# Patient Record
Sex: Female | Born: 1975 | Race: White | Hispanic: No | Marital: Married | Smoking: Never smoker
Health system: Southern US, Community
[De-identification: ages and names within clinical notes are randomized; demographics above are authoritative.]

## PROBLEM LIST (undated history)

## (undated) ENCOUNTER — Emergency Department (HOSPITAL_COMMUNITY): Discharge status: Against Medical Advice | Payer: BC Managed Care – PPO

## (undated) DIAGNOSIS — M2550 Pain in unspecified joint: Secondary | ICD-10-CM

## (undated) DIAGNOSIS — E785 Hyperlipidemia, unspecified: Secondary | ICD-10-CM

## (undated) DIAGNOSIS — R768 Other specified abnormal immunological findings in serum: Secondary | ICD-10-CM

## (undated) DIAGNOSIS — K5190 Ulcerative colitis, unspecified, without complications: Secondary | ICD-10-CM

## (undated) DIAGNOSIS — K219 Gastro-esophageal reflux disease without esophagitis: Secondary | ICD-10-CM

## (undated) DIAGNOSIS — F419 Anxiety disorder, unspecified: Secondary | ICD-10-CM

## (undated) DIAGNOSIS — R1012 Left upper quadrant pain: Secondary | ICD-10-CM

## (undated) DIAGNOSIS — R5383 Other fatigue: Secondary | ICD-10-CM

## (undated) DIAGNOSIS — B009 Herpesviral infection, unspecified: Secondary | ICD-10-CM

## (undated) DIAGNOSIS — I209 Angina pectoris, unspecified: Secondary | ICD-10-CM

## (undated) DIAGNOSIS — R7689 Other specified abnormal immunological findings in serum: Secondary | ICD-10-CM

## (undated) DIAGNOSIS — R1013 Epigastric pain: Secondary | ICD-10-CM

## (undated) DIAGNOSIS — J302 Other seasonal allergic rhinitis: Secondary | ICD-10-CM

## (undated) DIAGNOSIS — R5381 Other malaise: Secondary | ICD-10-CM

## (undated) DIAGNOSIS — R42 Dizziness and giddiness: Secondary | ICD-10-CM

## (undated) DIAGNOSIS — R209 Unspecified disturbances of skin sensation: Secondary | ICD-10-CM

## (undated) DIAGNOSIS — G44209 Tension-type headache, unspecified, not intractable: Secondary | ICD-10-CM

## (undated) DIAGNOSIS — F411 Generalized anxiety disorder: Secondary | ICD-10-CM

## (undated) DIAGNOSIS — F5102 Adjustment insomnia: Secondary | ICD-10-CM

## (undated) DIAGNOSIS — Z981 Arthrodesis status: Secondary | ICD-10-CM

## (undated) DIAGNOSIS — Z8739 Personal history of other diseases of the musculoskeletal system and connective tissue: Secondary | ICD-10-CM

## (undated) DIAGNOSIS — R0789 Other chest pain: Secondary | ICD-10-CM

## (undated) HISTORY — DX: Dizziness and giddiness: R42

## (undated) HISTORY — DX: Unspecified disturbances of skin sensation: R20.9

## (undated) HISTORY — DX: Ulcerative colitis, unspecified, without complications: K51.90

## (undated) HISTORY — DX: Other fatigue: R53.83

## (undated) HISTORY — DX: Other seasonal allergic rhinitis: J30.2

## (undated) HISTORY — DX: Anxiety disorder, unspecified: F41.9

## (undated) HISTORY — DX: Gastro-esophageal reflux disease without esophagitis: K21.9

## (undated) HISTORY — DX: Adjustment insomnia: F51.02

## (undated) HISTORY — DX: Epigastric pain: R10.13

## (undated) HISTORY — DX: Generalized anxiety disorder: F41.1

## (undated) HISTORY — DX: Other chest pain: R07.89

## (undated) HISTORY — DX: Personal history of other diseases of the musculoskeletal system and connective tissue: Z87.39

## (undated) HISTORY — DX: Other specified abnormal immunological findings in serum: R76.89

## (undated) HISTORY — DX: Arthrodesis status: Z98.1

## (undated) HISTORY — DX: Hyperlipidemia, unspecified: E78.5

## (undated) HISTORY — DX: Left upper quadrant pain: R10.12

## (undated) HISTORY — DX: Other malaise: R53.81

## (undated) HISTORY — DX: Pain in unspecified joint: M25.50

## (undated) HISTORY — DX: Tension-type headache, unspecified, not intractable: G44.209

## (undated) HISTORY — DX: Other specified abnormal immunological findings in serum: R76.8

---

## 2006-01-16 HISTORY — PX: SPINAL FUSION: SHX223

## 2008-01-17 HISTORY — PX: DILATION AND CURETTAGE OF UTERUS: SHX78

## 2010-01-16 HISTORY — PX: DILATION AND CURETTAGE OF UTERUS: SHX78

## 2011-12-19 LAB — HM PAP SMEAR: HM Pap smear: NEGATIVE

## 2012-02-07 ENCOUNTER — Encounter (HOSPITAL_COMMUNITY): Payer: Self-pay | Admitting: Pharmacist

## 2012-02-11 MED ORDER — DOXYCYCLINE HYCLATE 100 MG IV SOLR
100.0000 mg | Freq: Once | INTRAVENOUS | Status: AC
  Administered 2012-02-12: 100 mg via INTRAVENOUS
  Filled 2012-02-11: qty 100

## 2012-02-11 NOTE — H&P (Signed)
Alison Fleming is an 37 y.o. female with a history of menorrhagia, break through bleeding on oc, midcycle spotting since delivery.  Partuition complicated by retained placenta with post partum hemorrhage and curattage in both pregnancies.  Pt with normal pituitary hormone levels.  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  Pertinent Gynecological History: Menses: regulated on oc except midcycle spotting noted Bleeding: intermenstrual bleeding Contraception: OCP (estrogen/progesterone) DES exposure: denies Blood transfusions: none Sexually transmitted diseases: no past history Previous GYN Procedures: postpartum D&C  Last mammogram: n/a Date:  Last pap: normal Date: 12/2011 OB History: G3, P2012   Menstrual History: Menarche age: 15 No LMP recorded.    No past medical history on file.  No past surgical history on file.  No family history on file.  Social History:  does not have a smoking history on file. She does not have any smokeless tobacco history on file. Her alcohol and drug histories not on file.  Allergies: No Known Allergies  No prescriptions prior to admission    Review of Systems  Constitutional: Negative.   HENT: Negative.   Eyes: Negative.   Respiratory: Negative.   Cardiovascular: Negative.   Gastrointestinal: Negative.   Genitourinary: Negative.   Musculoskeletal: Negative.   Skin: Negative.   Neurological: Negative.   Endo/Heme/Allergies: Negative.   Psychiatric/Behavioral: Negative.  The patient does not have insomnia.     Height 5' 7"  (1.702 m), weight 58.968 kg (130 lb). Physical Exam  Constitutional: She is oriented to person, place, and time. She appears well-developed and well-nourished.  HENT:  Head: Normocephalic and atraumatic.  Eyes: Conjunctivae normal and EOM are normal. Pupils are equal, round, and reactive to light.  Neck: Normal range of motion. Neck supple.  Cardiovascular:  Normal rate, regular rhythm, normal heart sounds and intact distal pulses.   Respiratory: Effort normal and breath sounds normal.  GI: Soft. Bowel sounds are normal. Hernia confirmed negative in the right inguinal area and confirmed negative in the left inguinal area.  Genitourinary: Rectum normal, vagina normal and uterus normal. No breast swelling, tenderness, discharge or bleeding. Pelvic exam was performed with patient supine. There is no rash, tenderness, lesion or injury on the right labia. There is no rash, tenderness, lesion or injury on the left labia. Uterus is not deviated, not enlarged and not tender. Cervix exhibits no motion tenderness, no discharge and no friability. Right adnexum displays no mass, no tenderness and no fullness. Left adnexum displays no mass and no tenderness.  Musculoskeletal: Normal range of motion.  Neurological: She is alert and oriented to person, place, and time.  Skin: Skin is warm and dry.  Psychiatric: She has a normal mood and affect. Her behavior is normal. Judgment and thought content normal.    No results found for this or any previous visit (from the past 24 hour(s)).  No results found.  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    Taria Castrillo H 02/11/2012, 7:39 PM

## 2012-02-12 ENCOUNTER — Encounter (HOSPITAL_COMMUNITY)
Admission: RE | Disposition: A | Discharge status: Home / Self Care | Payer: Self-pay | Source: Ambulatory Visit | Attending: Gynecology

## 2012-02-12 ENCOUNTER — Encounter (HOSPITAL_COMMUNITY): Payer: Self-pay | Admitting: *Deleted

## 2012-02-12 ENCOUNTER — Ambulatory Visit (HOSPITAL_COMMUNITY)
Admission: RE | Admit: 2012-02-12 | Discharge: 2012-02-12 | Disposition: A | Discharge status: Home / Self Care | Payer: BC Managed Care – PPO | Source: Ambulatory Visit | Attending: Gynecology | Admitting: Gynecology

## 2012-02-12 ENCOUNTER — Encounter (HOSPITAL_COMMUNITY): Payer: Self-pay | Admitting: Anesthesiology

## 2012-02-12 ENCOUNTER — Ambulatory Visit (HOSPITAL_COMMUNITY): Payer: BC Managed Care – PPO | Admitting: Anesthesiology

## 2012-02-12 DIAGNOSIS — N938 Other specified abnormal uterine and vaginal bleeding: Secondary | ICD-10-CM | POA: Insufficient documentation

## 2012-02-12 DIAGNOSIS — N949 Unspecified condition associated with female genital organs and menstrual cycle: Secondary | ICD-10-CM | POA: Insufficient documentation

## 2012-02-12 DIAGNOSIS — N856 Intrauterine synechiae: Secondary | ICD-10-CM | POA: Insufficient documentation

## 2012-02-12 HISTORY — PX: DILATATION & CURRETTAGE/HYSTEROSCOPY WITH RESECTOCOPE: SHX5572

## 2012-02-12 LAB — CBC
HCT: 37.5 % (ref 36.0–46.0)
Hemoglobin: 12.7 g/dL (ref 12.0–15.0)
MCH: 29.7 pg (ref 26.0–34.0)
MCHC: 33.9 g/dL (ref 30.0–36.0)
RDW: 13 % (ref 11.5–15.5)

## 2012-02-12 SURGERY — DILATATION & CURETTAGE/HYSTEROSCOPY WITH RESECTOCOPE
Anesthesia: General | Site: Vagina | Wound class: Clean Contaminated

## 2012-02-12 MED ORDER — KETOROLAC TROMETHAMINE 30 MG/ML IJ SOLN
INTRAMUSCULAR | Status: DC | PRN
  Administered 2012-02-12: 30 mg via INTRAVENOUS

## 2012-02-12 MED ORDER — PROPOFOL 10 MG/ML IV EMUL
INTRAVENOUS | Status: AC
  Filled 2012-02-12: qty 20

## 2012-02-12 MED ORDER — LACTATED RINGERS IV SOLN
INTRAVENOUS | Status: DC
  Administered 2012-02-12: 09:00:00 via INTRAVENOUS

## 2012-02-12 MED ORDER — DEXTROSE 5 % IV SOLN
INTRAVENOUS | Status: DC | PRN
  Administered 2012-02-12: 09:00:00 via INTRAVENOUS

## 2012-02-12 MED ORDER — FENTANYL CITRATE 0.05 MG/ML IJ SOLN
INTRAMUSCULAR | Status: DC | PRN
  Administered 2012-02-12 (×4): 25 ug via INTRAVENOUS

## 2012-02-12 MED ORDER — MIDAZOLAM HCL 5 MG/5ML IJ SOLN
INTRAMUSCULAR | Status: DC | PRN
  Administered 2012-02-12: .5 mg via INTRAVENOUS
  Administered 2012-02-12: 1.5 mg via INTRAVENOUS

## 2012-02-12 MED ORDER — VASOPRESSIN 20 UNIT/ML IJ SOLN
INTRAMUSCULAR | Status: DC | PRN
  Administered 2012-02-12: 10 [IU]

## 2012-02-12 MED ORDER — BUPIVACAINE HCL (PF) 0.25 % IJ SOLN
INTRAMUSCULAR | Status: AC
  Filled 2012-02-12: qty 30

## 2012-02-12 MED ORDER — DEXAMETHASONE SODIUM PHOSPHATE 4 MG/ML IJ SOLN
INTRAMUSCULAR | Status: DC | PRN
  Administered 2012-02-12: 10 mg via INTRAVENOUS

## 2012-02-12 MED ORDER — VASOPRESSIN 20 UNIT/ML IJ SOLN
INTRAMUSCULAR | Status: AC
  Filled 2012-02-12: qty 1

## 2012-02-12 MED ORDER — ESTROGENS CONJUGATED 25 MG IJ SOLR
25.0000 mg | Freq: Once | INTRAMUSCULAR | Status: AC
  Administered 2012-02-12: 25 mg via INTRAVENOUS
  Filled 2012-02-12: qty 25

## 2012-02-12 MED ORDER — LIDOCAINE HCL (CARDIAC) 20 MG/ML IV SOLN
INTRAVENOUS | Status: AC
  Filled 2012-02-12: qty 5

## 2012-02-12 MED ORDER — BUPIVACAINE HCL (PF) 0.25 % IJ SOLN
INTRAMUSCULAR | Status: DC | PRN
  Administered 2012-02-12: 10 mL

## 2012-02-12 MED ORDER — LIDOCAINE HCL (CARDIAC) 20 MG/ML IV SOLN
INTRAVENOUS | Status: DC | PRN
  Administered 2012-02-12: 80 mg via INTRAVENOUS

## 2012-02-12 MED ORDER — METOCLOPRAMIDE HCL 5 MG/ML IJ SOLN: 10.0000 mg | Freq: Once | INTRAMUSCULAR | Status: DC | PRN

## 2012-02-12 MED ORDER — KETOROLAC TROMETHAMINE 30 MG/ML IJ SOLN
INTRAMUSCULAR | Status: AC
  Filled 2012-02-12: qty 1

## 2012-02-12 MED ORDER — MIDAZOLAM HCL 2 MG/2ML IJ SOLN
INTRAMUSCULAR | Status: AC
  Filled 2012-02-12: qty 2

## 2012-02-12 MED ORDER — BUPIVACAINE-EPINEPHRINE 0.25% -1:200000 IJ SOLN: INTRAMUSCULAR | Status: DC | PRN

## 2012-02-12 MED ORDER — FENTANYL CITRATE 0.05 MG/ML IJ SOLN
INTRAMUSCULAR | Status: AC
  Filled 2012-02-12: qty 2

## 2012-02-12 MED ORDER — DEXAMETHASONE SODIUM PHOSPHATE 10 MG/ML IJ SOLN
INTRAMUSCULAR | Status: AC
  Filled 2012-02-12: qty 2

## 2012-02-12 MED ORDER — ONDANSETRON HCL 4 MG/2ML IJ SOLN
INTRAMUSCULAR | Status: AC
  Filled 2012-02-12: qty 2

## 2012-02-12 MED ORDER — GLYCINE 1.5 % IR SOLN
Status: DC | PRN
  Administered 2012-02-12: 3000 mL

## 2012-02-12 MED ORDER — SODIUM CHLORIDE 0.9 % IJ SOLN
INTRAMUSCULAR | Status: DC | PRN
  Administered 2012-02-12: 50 mL

## 2012-02-12 MED ORDER — FENTANYL CITRATE 0.05 MG/ML IJ SOLN: 25.0000 ug | INTRAMUSCULAR | Status: DC | PRN

## 2012-02-12 MED ORDER — ONDANSETRON HCL 4 MG/2ML IJ SOLN
INTRAMUSCULAR | Status: DC | PRN
  Administered 2012-02-12: 4 mg via INTRAVENOUS

## 2012-02-12 MED ORDER — MEPERIDINE HCL 25 MG/ML IJ SOLN: 6.2500 mg | INTRAMUSCULAR | Status: DC | PRN

## 2012-02-12 MED ORDER — PROPOFOL 10 MG/ML IV EMUL
INTRAVENOUS | Status: DC | PRN
  Administered 2012-02-12: 200 mg via INTRAVENOUS

## 2012-02-12 SURGICAL SUPPLY — 15 items
CATH ROBINSON RED A/P 16FR (CATHETERS) ×2 IMPLANT
CONTAINER PREFILL 10% NBF 60ML (FORM) ×4 IMPLANT
DRESSING TELFA 8X3 (GAUZE/BANDAGES/DRESSINGS) ×2 IMPLANT
ELECT REM PT RETURN 9FT ADLT (ELECTROSURGICAL) ×2
ELECTRODE REM PT RTRN 9FT ADLT (ELECTROSURGICAL) ×1 IMPLANT
GLOVE BIOGEL M 6.5 STRL (GLOVE) ×2 IMPLANT
GLOVE BIOGEL PI IND STRL 7.0 (GLOVE) ×1 IMPLANT
GLOVE BIOGEL PI INDICATOR 7.0 (GLOVE) ×1
GOWN STRL REIN XL XLG (GOWN DISPOSABLE) ×4 IMPLANT
LOOP ANGLED CUTTING 22FR (CUTTING LOOP) ×2 IMPLANT
NEEDLE HYPO 21X1.5 SAFETY (NEEDLE) ×2 IMPLANT
PACK HYSTEROSCOPY LF (CUSTOM PROCEDURE TRAY) ×2 IMPLANT
PAD OB MATERNITY 4.3X12.25 (PERSONAL CARE ITEMS) ×2 IMPLANT
TOWEL OR 17X24 6PK STRL BLUE (TOWEL DISPOSABLE) ×4 IMPLANT
WATER STERILE IRR 1000ML POUR (IV SOLUTION) ×2 IMPLANT

## 2012-02-12 NOTE — Anesthesia Preprocedure Evaluation (Addendum)
Anesthesia Evaluation  Patient identified by MRN, date of birth, ID band Patient awake    Reviewed: Allergy & Precautions, H&P , NPO status , Patient's Chart, lab work & pertinent test results  Airway Mallampati: II TM Distance: >3 FB Neck ROM: Full    Dental No notable dental hx. (+) Teeth Intact   Pulmonary neg pulmonary ROS,  breath sounds clear to auscultation  Pulmonary exam normal       Cardiovascular negative cardio ROS  Rhythm:Regular Rate:Normal     Neuro/Psych negative neurological ROS  negative psych ROS   GI/Hepatic negative GI ROS, Neg liver ROS,   Endo/Other  negative endocrine ROS  Renal/GU negative Renal ROS  negative genitourinary   Musculoskeletal   Abdominal Normal abdominal exam  (+)   Peds  Hematology negative hematology ROS (+)   Anesthesia Other Findings   Reproductive/Obstetrics Menorrhagia Endometrial polyp Uterine Synechiae                          Anesthesia Physical Anesthesia Plan  ASA: I  Anesthesia Plan: General   Post-op Pain Management:    Induction: Intravenous  Airway Management Planned: LMA  Additional Equipment:   Intra-op Plan:   Post-operative Plan: Extubation in OR  Informed Consent: I have reviewed the patients History and Physical, chart, labs and discussed the procedure including the risks, benefits and alternatives for the proposed anesthesia with the patient or authorized representative who has indicated his/her understanding and acceptance.   Dental advisory given  Plan Discussed with: CRNA, Anesthesiologist and Surgeon  Anesthesia Plan Comments:         Anesthesia Quick Evaluation

## 2012-02-12 NOTE — Transfer of Care (Signed)
Immediate Anesthesia Transfer of Care Note  Patient: Alison Fleming  Procedure(s) Performed: Procedure(s) (LRB) with comments: DILATATION & CURETTAGE/HYSTEROSCOPY WITH RESECTOCOPE (N/A)  Patient Location: PACU  Anesthesia Type:General  Level of Consciousness: awake, alert  and oriented  Airway & Oxygen Therapy: Patient Spontanous Breathing and Patient connected to nasal cannula oxygen  Post-op Assessment: Report given to PACU RN, Post -op Vital signs reviewed and stable and Patient moving all extremities X 4  Post vital signs: Reviewed and stable  Complications: No apparent anesthesia complications

## 2012-02-12 NOTE — Brief Op Note (Signed)
02/12/2012  10:59 AM  PATIENT:  Alison Fleming  37 y.o. female with anterior-posterior uterine synechia  PRE-OPERATIVE DIAGNOSIS:  BTB, UTERINE SYNECHIA  POST-OPERATIVE DIAGNOSIS:  Uterine Synechia  PROCEDURE:  Procedure(s) (LRB) with comments: DILATATION & CURETTAGE/HYSTEROSCOPY WITH RESECTOCOPE (N/A)  SURGEON:  Surgeon(s) and Role:    * Azalia Bilis, MD - Primary  PHYSICIAN ASSISTANT:   ASSISTANTS: none   ANESTHESIA:   general  EBL:  Total I/O In: 1750 [I.V.:1750] Out: 205 [Urine:175; Blood:30]  BLOOD ADMINISTERED:none  DRAINS: none   LOCAL MEDICATIONS USED:  MARCAINE    and Amount: 5 ml  SPECIMEN:  Source of Specimen:  cervix  DISPOSITION OF SPECIMEN:  PATHOLOGY  COUNTS:  YES  TOURNIQUET:  * No tourniquets in log *  DICTATION: .Other Dictation: Dictation Number [id number]  PLAN OF CARE: Discharge to home after PACU  PATIENT DISPOSITION:  PACU - hemodynamically stable.   Delay start of Pharmacological VTE agent (>24hrs) due to surgical blood loss or risk of bleeding: not applicable

## 2012-02-12 NOTE — Anesthesia Postprocedure Evaluation (Signed)
  Anesthesia Post-op Note  Patient: Alison Fleming  Procedure(s) Performed: Procedure(s) (LRB) with comments: DILATATION & CURETTAGE/HYSTEROSCOPY WITH RESECTOCOPE (N/A)  Patient Location: PACU  Anesthesia Type:General  Level of Consciousness: awake, alert  and oriented  Airway and Oxygen Therapy: Patient Spontanous Breathing  Post-op Pain: none  Post-op Assessment: Post-op Vital signs reviewed, Patient's Cardiovascular Status Stable, Respiratory Function Stable, Patent Airway, No signs of Nausea or vomiting, Adequate PO intake and Pain level controlled  Post-op Vital Signs: Reviewed and stable  Complications: No apparent anesthesia complications

## 2012-02-12 NOTE — Preoperative (Signed)
Beta Blockers   Reason not to administer Beta Blockers:Not Applicable 

## 2012-02-13 ENCOUNTER — Encounter (HOSPITAL_COMMUNITY): Payer: Self-pay | Admitting: Gynecology

## 2012-02-13 NOTE — Op Note (Signed)
NAMEZAYA, KESSENICH              ACCOUNT NO.:  [id number]  MEDICAL RECORD NO.:  [id number]  LOCATION:  [facility]                          FACILITY:  [facility]  PHYSICIAN:  Alver Sorrow. Charlies Constable, M.D. DATE OF BIRTH:  1976-01-14  DATE OF PROCEDURE:  02/12/2012 DATE OF DISCHARGE:  02/12/2012                              OPERATIVE REPORT   PREOPERATIVE DIAGNOSIS:  Uterine synechiae and breakthrough bleeding on oral contraceptives.  POSTOPERATIVE DIAGNOSIS:  Uterine synechiae and breakthrough bleeding on oral contraceptives.  PROCEDURE:  Diagnostic hysteroscopy with resection of the synechiae.  SURGEON:  Alver Sorrow. Charlies Constable, M.D.  ANESTHESIA:  General.  IV FLUIDS:  1750.  URINE OUTPUT:  175.  I AND O DEFICIT:  1.5% sorbitol solution was 90.  PATHOLOGY:  Uterine scrapings.  FINDINGS:  Anteverted uterus that sounded to 9.  Normal contours of the uterus were appreciated.  Ostia were visualized.  The cervix was without any appreciable polyp.  The anterior synechiae scar was notable and hemostatic.  PROCEDURE:  The patient was taken to the operating room.  General anesthesia was induced and placed in the dorsal lithotomy position, prepped and draped in the usual sterile fashion.  Bimanual exam was performed.  Bivalve speculum was placed in the vagina.  The cervix was visualized with a single tooth tenaculum and then injected with a total of 10 mL of equal part of dilute Pitocin of 10 units and 50 with 0.25% Marcaine.  The speculum was then changed for sterile weighted speculum. The questionable polyp was visualized on the cervix.  The uterus was sounded and then gently dilated up to 19-French after which the diagnostic scope was advanced to the cervix towards the fundus.  The uterine contours were as mentioned above.  The ostia were visualized. Slow removal of the hysteroscope showed that the scar had inadvertently ruptured and there was a wide band scar anteriorly consistent with the synechiae.   The inferior aspect was noted and was hemostatic, however, was unable to be grasped, slow removal of the scope failed to show any true polyp, however, one was felt to be seen on speculum exam, the hysteroscope was then removed.  Gentle curettage of the cervix and placement of polyp forceps revealled tissue felt to be more consistent with  cervical mucus.  The hysteroscope was then advanced back through the cervix in an attempt to better visualize the cervical mucosa and no true polyp was able to be seen.  The instruments were then removed.  A gentle pressure was placed in the cervix and anterior lip of the cervix where the single-tooth tenaculum had been. The endocervix was weepy and was treated with a little bit of liquid Monsel solution.  The speculum was removed and gentle pressure was placed on the cervix anteriorly with a sponge on stick which were removed and hemostatic.  The instruments were then removed.  The patient tolerated the procedure well.  Sponge, lap, and needle counts were correct x2.  She was given IV doxycycline intraoperatively and was transferred to the recovery room in stable condition where she will receive IV Premarin.     Alver Sorrow. Charlies Constable, M.D.     THL/MEDQ  D:  02/12/2012  T:  02/13/2012  Job:  [id number]

## 2012-05-13 ENCOUNTER — Encounter: Payer: Self-pay | Admitting: Gynecology

## 2012-05-13 ENCOUNTER — Ambulatory Visit (INDEPENDENT_AMBULATORY_CARE_PROVIDER_SITE_OTHER): Payer: BC Managed Care – PPO | Admitting: Gynecology

## 2012-05-13 ENCOUNTER — Telehealth: Payer: Self-pay | Admitting: Gynecology

## 2012-05-13 VITALS — BP 102/66 | Resp 14 | Wt 138.0 lb

## 2012-05-13 DIAGNOSIS — N898 Other specified noninflammatory disorders of vagina: Secondary | ICD-10-CM

## 2012-05-13 DIAGNOSIS — N921 Excessive and frequent menstruation with irregular cycle: Secondary | ICD-10-CM

## 2012-05-13 DIAGNOSIS — L293 Anogenital pruritus, unspecified: Secondary | ICD-10-CM

## 2012-05-13 LAB — POCT WET PREP (WET MOUNT): Clue Cells Wet Prep Whiff POC: NEGATIVE

## 2012-05-13 NOTE — Telephone Encounter (Signed)
Patient missed one birth control pill 11 days ago and has been bleeding. Patient also thinks she may have an infection.

## 2012-05-13 NOTE — Patient Instructions (Signed)
Can try monostat 1 or vagisil for itching, return to office if symptoms worsen-bleeding or itching

## 2012-05-13 NOTE — Progress Notes (Signed)
Subjective:     Patient ID: Alison Fleming, female   DOB: 1975/12/07, 37 y.o.   MRN: [id number]  HPI Comments: Pt with recent hysteroscopy with lysis of uterine synechia in January, now presents for break through bleeding on [place]con.  Pt had done well since OR but reports this cycle she missed a pill on day 5 of pack.  She bagan with BTB next day, bleeding is variable sometimes requiring a pad, can be dark or red. Began mild cramping yesteday, did a 5K walk/run day before.  Questions re vaginal itch as well.  Vaginal Bleeding     Review of Systems  Genitourinary: Positive for vaginal bleeding.  Psychiatric/Behavioral: Negative for agitation.  Per HPI     Objective:   Physical Exam  Genitourinary:      BP 102/66  Resp 14  Wt 138 lb (62.596 kg)  BMI 21.61 kg/m2  LMP 04/22/2012 General appearance: alert, cooperative and appears stated age Pelvic: cervix normal in appearance, external genitalia normal, no adnexal masses or tenderness, no cervical motion tenderness, uterus normal size, shape, and consistency and scant blood tinged discharge in vault. WP done    Assessment:     Break through bleeding on oc after missed pill, scant amount Normal vaginal discharge     Plan:     Pt assured, will contine to watch, because of history of DUB on OC, we have asked her to call if continues into next pack, she is agreeable Vaginal itching poss related to small amount of bleeding, recommend OTC vagisil or monostat if she desires F/u prn

## 2012-05-13 NOTE — Telephone Encounter (Signed)
Patient notified of need to come to office to be seen. Appt. Given this morning with Dr. Quincy Simmonds.

## 2012-06-01 ENCOUNTER — Telehealth: Payer: Self-pay | Admitting: Obstetrics & Gynecology

## 2012-06-01 NOTE — Telephone Encounter (Signed)
Pt called with two day hx of increased urinary frequency, urgency, and dysuria.  Thinks she may have a UTI and would like treatment.  No fever or back pain.  No STD risk.  Taking cranberry tablets which may be helping some.  History reviewed in EPIC.  ciprofloxin 581m bid x 3 days called into [facility] [address]  Precautions given for calling back including fever, back pain, worsening symptoms.

## 2012-06-04 ENCOUNTER — Telehealth: Payer: Self-pay | Admitting: Gynecology

## 2012-06-04 NOTE — Telephone Encounter (Signed)
Patient notified of Dr. Sabra Heck response to antibiotic management. Patient states will wait and see and call back if any symptoms occur.

## 2012-06-04 NOTE — Telephone Encounter (Signed)
Patient called inquiring about the symptoms associated with antibiotics for urinary tract infection.

## 2012-06-04 NOTE — Telephone Encounter (Signed)
PATIENT TOOK ANTIBIOTIC PRESCRIBED TO HER ON PAST Saturday FOR UTI  BY DR. Sabra Heck. CONCERNED WHEN TOOK LAST ANTIBIOTIC PILL SAW WARNING ON BOTTLE OF DO NOT TAKE 6 HOURS BEFORE OR 2 HOUR AFTER TAKING IRON /MINERAL SUPPLEMENTS. PATIENT STATES HAS BEEN TAKING HER MULTI  VITAMIN EVERY MORNING WITH ANTIBIOTIC. STATES SHE DID NOTICE THAT IT TOOK LONGER FOR SYMPTOMS TO DECREASE  WITH THIS ANTIBIOTIC. GOING OUT OF TOWN NEXT WEEK AND DID NOT WANT THE UTI SYMPTOMS TO REOCCUR WITH HAVING TOOK  VITAMINS WITH ANTIBIOTIC. PLEASE ADVISE. Alison Fleming

## 2012-06-04 NOTE — Telephone Encounter (Signed)
If she is worried, she can always come and give a urine sample.  If anything looks abnormal, will treat again.  If symptoms are gone, she can also just wait and see.  Usually three days of antibiotics, even with taking then near her multivitamin, should be enough treatment.

## 2012-06-07 ENCOUNTER — Encounter: Payer: Self-pay | Admitting: Nurse Practitioner

## 2012-06-07 ENCOUNTER — Ambulatory Visit (INDEPENDENT_AMBULATORY_CARE_PROVIDER_SITE_OTHER): Payer: BC Managed Care – PPO | Admitting: Nurse Practitioner

## 2012-06-07 ENCOUNTER — Ambulatory Visit: Payer: BC Managed Care – PPO | Admitting: Obstetrics and Gynecology

## 2012-06-07 ENCOUNTER — Telehealth: Payer: Self-pay | Admitting: Gynecology

## 2012-06-07 VITALS — BP 112/56 | HR 68 | Temp 98.3°F | Ht 66.0 in | Wt 137.4 lb

## 2012-06-07 DIAGNOSIS — B373 Candidiasis of vulva and vagina: Secondary | ICD-10-CM

## 2012-06-07 DIAGNOSIS — N390 Urinary tract infection, site not specified: Secondary | ICD-10-CM

## 2012-06-07 LAB — POCT WET PREP (WET MOUNT)

## 2012-06-07 LAB — POCT URINALYSIS DIPSTICK
Leukocytes, UA: NEGATIVE
pH, UA: 6

## 2012-06-07 MED ORDER — FLUCONAZOLE 150 MG PO TABS: 150.0000 mg | ORAL_TABLET | Freq: Once | ORAL | Status: DC

## 2012-06-07 MED ORDER — NITROFURANTOIN MONOHYD MACRO 100 MG PO CAPS: 100.0000 mg | ORAL_CAPSULE | Freq: Two times a day (BID) | ORAL | Status: DC

## 2012-06-07 NOTE — Patient Instructions (Signed)
Monilial Vaginitis Vaginitis in a soreness, swelling and redness (inflammation) of the vagina and vulva. Monilial vaginitis is not a sexually transmitted infection. CAUSES  Yeast vaginitis is caused by yeast (candida) that is normally found in your vagina. With a yeast infection, the candida has overgrown in number to a point that upsets the chemical balance. SYMPTOMS   White, thick vaginal discharge.  Swelling, itching, redness and irritation of the vagina and possibly the lips of the vagina (vulva).  Burning or painful urination.  Painful intercourse. DIAGNOSIS  Things that may contribute to monilial vaginitis are:  Postmenopausal and virginal states.  Pregnancy.  Infections.  Being tired, sick or stressed, especially if you had monilial vaginitis in the past.  Diabetes. Good control will help lower the chance.  Birth control pills.  Tight fitting garments.  Using bubble bath, feminine sprays, douches or deodorant tampons.  Taking certain medications that kill germs (antibiotics).  Sporadic recurrence can occur if you become ill. TREATMENT  Your caregiver will give you medication.  There are several kinds of anti monilial vaginal creams and suppositories specific for monilial vaginitis. For recurrent yeast infections, use a suppository or cream in the vagina 2 times a week, or as directed.  Anti-monilial or steroid cream for the itching or irritation of the vulva may also be used. Get your caregiver's permission.  Painting the vagina with methylene blue solution may help if the monilial cream does not work.  Eating yogurt may help prevent monilial vaginitis. HOME CARE INSTRUCTIONS   Finish all medication as prescribed.  Do not have sex until treatment is completed or after your caregiver tells you it is okay.  Take warm sitz baths.  Do not douche.  Do not use tampons, especially scented ones.  Wear cotton underwear.  Avoid tight pants and panty  hose.  Tell your sexual partner that you have a yeast infection. They should go to their caregiver if they have symptoms such as mild rash or itching.  Your sexual partner should be treated as well if your infection is difficult to eliminate.  Practice safer sex. Use condoms.  Some vaginal medications cause latex condoms to fail. Vaginal medications that harm condoms are:  Cleocin cream.  Butoconazole (Femstat).  Terconazole (Terazol) vaginal suppository.  Miconazole (Monistat) (may be purchased over the counter). SEEK MEDICAL CARE IF:   You have a temperature by mouth above 102 F (38.9 C).  The infection is getting worse after 2 days of treatment.  The infection is not getting better after 3 days of treatment.  You develop blisters in or around your vagina.  You develop vaginal bleeding, and it is not your menstrual period.  You have pain when you urinate.  You develop intestinal problems.  You have pain with sexual intercourse. Document Released: 10/12/2004 Document Revised: 03/27/2011 Document Reviewed: 06/26/2008 [facility] Patient Information 2014 [place], [place].   Urinary Tract Infection Urinary tract infections (UTIs) can develop anywhere along your urinary tract. Your urinary tract is your body's drainage system for removing wastes and extra water. Your urinary tract includes two kidneys, two ureters, a bladder, and a urethra. Your kidneys are a pair of bean-shaped organs. Each kidney is about the size of your fist. They are located below your ribs, one on each side of your spine. CAUSES Infections are caused by microbes, which are microscopic organisms, including fungi, viruses, and bacteria. These organisms are so small that they can only be seen through a microscope. Bacteria are the microbes that most  commonly cause UTIs. SYMPTOMS  Symptoms of UTIs may vary by age and gender of the patient and by the location of the infection. Symptoms in young women  typically include a frequent and intense urge to urinate and a painful, burning feeling in the bladder or urethra during urination. Older women and men are more likely to be tired, shaky, and weak and have muscle aches and abdominal pain. A fever may mean the infection is in your kidneys. Other symptoms of a kidney infection include pain in your back or sides below the ribs, nausea, and vomiting. DIAGNOSIS To diagnose a UTI, your caregiver will ask you about your symptoms. Your caregiver also will ask to provide a urine sample. The urine sample will be tested for bacteria and white blood cells. White blood cells are made by your body to help fight infection. TREATMENT  Typically, UTIs can be treated with medication. Because most UTIs are caused by a bacterial infection, they usually can be treated with the use of antibiotics. The choice of antibiotic and length of treatment depend on your symptoms and the type of bacteria causing your infection. HOME CARE INSTRUCTIONS  If you were prescribed antibiotics, take them exactly as your caregiver instructs you. Finish the medication even if you feel better after you have only taken some of the medication.  Drink enough water and fluids to keep your urine clear or pale yellow.  Avoid caffeine, tea, and carbonated beverages. They tend to irritate your bladder.  Empty your bladder often. Avoid holding urine for long periods of time.  Empty your bladder before and after sexual intercourse.  After a bowel movement, women should cleanse from front to back. Use each tissue only once. SEEK MEDICAL CARE IF:   You have back pain.  You develop a fever.  Your symptoms do not begin to resolve within 3 days. SEEK IMMEDIATE MEDICAL CARE IF:   You have severe back pain or lower abdominal pain.  You develop chills.  You have nausea or vomiting.  You have continued burning or discomfort with urination. MAKE SURE YOU:   Understand these  instructions.  Will watch your condition.  Will get help right away if you are not doing well or get worse. Document Released: 10/12/2004 Document Revised: 07/04/2011 Document Reviewed: 02/10/2011 [facility] Patient Information 2014 [place].

## 2012-06-07 NOTE — Telephone Encounter (Signed)
Pt concerned with severity of UTI symptoms.

## 2012-06-07 NOTE — Progress Notes (Signed)
Subjective:     Patient ID: Alison Fleming, female   DOB: [birth year], 37 y.o.   MRN: [id number]  Pelvic Pain The patient's pertinent negatives include no pelvic pain or vaginal discharge. Associated symptoms include dysuria, a fever, frequency and urgency. Pertinent negatives include no abdominal pain, chills, constipation, diarrhea, flank pain, headaches, hematuria, nausea or vomiting.  Patient states she called Dr. Sabra Heck last weekend with symptoms of UTI.  Had urgency and urinary pressure.  She has history of UTI.  Last Saturday, Sunday and Monday and took Cipro 500 mg for 3 days.  Now symptoms are better and pressure is somewhat still there.  She continues to a have a sense of not emptying bladder well. Today a little vaginal discharge without itching or odor. On OCP and compliant with LMP 05/20/12. Last sexually active 05/26/12 that will sometime cause UTI symptoms.  Review of Systems  Constitutional: Positive for fever. Negative for chills, activity change, appetite change and fatigue.  Gastrointestinal: Negative for nausea, vomiting, abdominal pain, diarrhea, constipation, abdominal distention and anal bleeding.  Genitourinary: Positive for dysuria, urgency and frequency. Negative for hematuria, flank pain, decreased urine volume, vaginal bleeding, vaginal discharge, difficulty urinating, menstrual problem, pelvic pain and dyspareunia.       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  Neurological: Negative for dizziness and headaches.       Objective:   Physical Exam  Constitutional: She is oriented to person, place, and time. She appears well-developed and well-nourished.  Cardiovascular: Normal rate.   Pulmonary/Chest: Effort normal.  Abdominal: Soft. She exhibits no distension and no mass. There is no tenderness. There is no rebound and no guarding.  Genitourinary:  Vaginal discharge is thin white and only small amount. No other lesions.  No pain on bimanual to bladder or  adnexa.  Neurological: She is alert and oriented to person, place, and time.  Psychiatric: She has a normal mood and affect. Her behavior is normal. Judgment and thought content normal.       Assessment:     R/O UTI  Recurrent Mild Yeast vaginitis most likely from recent antibiotics    Plan:     Will send urine for culture and follow RX for Macrobid 100 mg BID to pharmacy - if symptoms get worse over the weekend to start otherwise patient feels she can wait for culture results. RX for Diflucan 150 mg X 2.

## 2012-06-09 LAB — URINE CULTURE: Colony Count: 2000

## 2012-06-11 ENCOUNTER — Telehealth: Payer: Self-pay | Admitting: *Deleted

## 2012-06-11 NOTE — Progress Notes (Signed)
Encounter reviewed by Dr. Erisha Paugh Silva.  

## 2012-06-11 NOTE — Telephone Encounter (Signed)
Pt is aware of negative culture results and states she is feeling better.

## 2012-06-11 NOTE — Telephone Encounter (Signed)
Message copied by Bobbe Medico on Tue Jun 11, 2012 10:43 AM ------      Message from: Kem Boroughs R      Created: Tue Jun 11, 2012  8:25 AM       Let pt know urine c&s is negative ------

## 2012-06-11 NOTE — Telephone Encounter (Signed)
Message copied by Bobbe Medico on Tue Jun 11, 2012  1:53 PM ------      Message from: Antonietta Barcelona      Created: Tue Jun 11, 2012 12:51 PM       Let patient know negative urine culture ------

## 2012-06-11 NOTE — Telephone Encounter (Signed)
Pt is aware of negative urine culture results.

## 2012-10-21 ENCOUNTER — Telehealth: Payer: Self-pay | Admitting: Nurse Practitioner

## 2012-10-21 NOTE — Telephone Encounter (Signed)
LMTCB 10/21/12 cm

## 2012-10-21 NOTE — Telephone Encounter (Signed)
450m. Acyclovir RX requested please? [facility]  [facility] [address]

## 2012-10-22 ENCOUNTER — Other Ambulatory Visit: Payer: Self-pay | Admitting: Gynecology

## 2012-10-22 MED ORDER — ACYCLOVIR 400 MG PO TABS: 400.0000 mg | ORAL_TABLET | ORAL | Status: DC | PRN

## 2012-10-22 NOTE — Telephone Encounter (Signed)
LMTCB  aa

## 2012-10-22 NOTE — Telephone Encounter (Signed)
Spoke with pt about acyclovir. Pt is out of med and currently has a cold sore on her lip. Pt says she discussed the med with Dr. Charlies Constable previously, but didn't need refills at the time. Are you OK with refilling this?

## 2012-10-22 NOTE — Telephone Encounter (Signed)
Patient returned Carol's call.

## 2012-10-22 NOTE — Telephone Encounter (Signed)
Patient advised medication sent to pharmacy will pick up.

## 2012-10-22 NOTE — Telephone Encounter (Signed)
Ok, sent in

## 2012-11-21 ENCOUNTER — Other Ambulatory Visit: Payer: Self-pay

## 2012-12-25 ENCOUNTER — Encounter: Payer: Self-pay | Admitting: Nurse Practitioner

## 2012-12-26 ENCOUNTER — Encounter: Payer: Self-pay | Admitting: Nurse Practitioner

## 2012-12-26 ENCOUNTER — Ambulatory Visit (INDEPENDENT_AMBULATORY_CARE_PROVIDER_SITE_OTHER): Payer: BC Managed Care – PPO | Admitting: Certified Nurse Midwife

## 2012-12-26 VITALS — BP 108/70 | HR 76 | Ht 66.25 in | Wt 136.0 lb

## 2012-12-26 DIAGNOSIS — Z01419 Encounter for gynecological examination (general) (routine) without abnormal findings: Secondary | ICD-10-CM

## 2012-12-26 DIAGNOSIS — N760 Acute vaginitis: Secondary | ICD-10-CM

## 2012-12-26 DIAGNOSIS — Z0000 Encounter for general adult medical examination without abnormal findings: Secondary | ICD-10-CM

## 2012-12-26 DIAGNOSIS — Z309 Encounter for contraceptive management, unspecified: Secondary | ICD-10-CM

## 2012-12-26 DIAGNOSIS — B009 Herpesviral infection, unspecified: Secondary | ICD-10-CM

## 2012-12-26 LAB — POCT URINALYSIS DIPSTICK
Blood, UA: NEGATIVE
Nitrite, UA: NEGATIVE
Urobilinogen, UA: NEGATIVE
pH, UA: 5

## 2012-12-26 LAB — LIPID PANEL
Cholesterol: 177 mg/dL (ref 0–200)
HDL: 57 mg/dL (ref >39)
Total CHOL/HDL Ratio: 3.1 Ratio
Triglycerides: 111 mg/dL (ref <150)
VLDL: 22 mg/dL (ref 0–40)

## 2012-12-26 LAB — COMPREHENSIVE METABOLIC PANEL
BUN: 13 mg/dL (ref 6–23)
CO2: 26 mEq/L (ref 19–32)
Creat: 0.73 mg/dL (ref 0.50–1.10)
Glucose, Bld: 77 mg/dL (ref 70–99)
Total Bilirubin: 0.5 mg/dL (ref 0.3–1.2)
Total Protein: 7.1 g/dL (ref 6.0–8.3)

## 2012-12-26 MED ORDER — ACYCLOVIR 400 MG PO TABS: 400.0000 mg | ORAL_TABLET | ORAL | Status: DC | PRN

## 2012-12-26 MED ORDER — METRONIDAZOLE 0.75 % VA GEL: VAGINAL | Status: DC

## 2012-12-26 MED ORDER — NORETHINDRONE-ETH ESTRADIOL 1-35 MG-MCG PO TABS: 1.0000 | ORAL_TABLET | Freq: Every day | ORAL | Status: DC

## 2012-12-26 NOTE — Patient Instructions (Signed)

## 2012-12-26 NOTE — Progress Notes (Signed)
Patient ID: Alison Fleming, female   DOB: 11-Jan-1976, 37 y.o.   MRN: [id number] 37 y.o. [id number] Married Caucasian Fe here for annual exam.  Periods normal, no issues. Has noticed spotting x 3 in past months with bowel movements. Denies vaginal itching or odor, but history of BV. No new personal products. No pain with sexual activity. Contraception working well if she stays on brand Necon 1/35. Requests fasting labs today. Sees pcp prn. No other health issues.  Patient's last menstrual period was 12/03/2012.          Sexually active: yes  The current method of family planning is OCP (estrogen/progesterone).    Exercising: yes  running 2-3 times per week Smoker:  no  Health Maintenance: Pap: 12/19/11, WNL, neg HR HPV TDaP:  UTD Labs: HB: 12.7 Urine:    reports that she has never smoked. She has never used smokeless tobacco. She reports that she drinks about 1.2 ounces of alcohol per week. She reports that she does not use illicit drugs.  Past Medical History  Diagnosis Date  . Anxiety   . STD (sexually transmitted disease)     HSV 1    Past Surgical History  Procedure Laterality Date  . Spinal fusion  2008    rods on both sides of entire spinal column for scoliocis  . Dilation and curettage of uterus  2010  . Dilation and curettage of uterus  2012  . Dilatation & currettage/hysteroscopy with resectocope  02/12/2012    Procedure: [place];  Surgeon: Azalia Bilis, MD;  Location: [facility];  Service: Gynecology;  Laterality: N/A;    Current Outpatient Prescriptions  Medication Sig Dispense Refill  . acetaminophen (TYLENOL) 500 MG tablet Take 500 mg by mouth every 6 (six) hours as needed. For headache.      [name] acyclovir (ZOVIRAX) 400 MG tablet Take 1 tablet (400 mg total) by mouth as needed. For Herpes Zoster  20 tablet  1  . Cranberry 600 MG TABS Take 3,600 mg by mouth daily.      [name] ibuprofen (ADVIL,MOTRIN) 200 MG tablet Take 400 mg by mouth  every 6 (six) hours as needed. For headache/pain.      [name] norethindrone-ethinyl estradiol 1/35 (ORTHO-NOVUM, NORTREL,CYCLAFEM) tablet Take 1 tablet by mouth daily.      . Prenatal Vit-Fe Fumarate-FA (MULTIVITAMIN-PRENATAL) 27-0.8 MG TABS Take 1 tablet by mouth daily.       No current facility-administered medications for this visit.    Family History  Problem Relation Age of Onset  . Diabetes Maternal Grandmother   . Diabetes Maternal Grandfather   . Hypertension Mother   . Hypertension Maternal Grandmother   . Hypertension Maternal Grandfather   . Heart disease Maternal Grandmother     Heart attack  . Heart disease Maternal Grandfather     heart attack  . Heart attack Maternal Grandmother   . Heart attack Maternal Grandfather   . Anxiety disorder Mother   . Osteoporosis Maternal Grandmother     ROS:  Pertinent items are noted in HPI.  Otherwise, a comprehensive ROS was negative.  Exam:   BP 108/70  Pulse 76  Ht 5' 6.25" (1.683 m)  Wt 136 lb (61.689 kg)  BMI 21.78 kg/m2  LMP 12/03/2012 Height: 5' 6.25" (168.3 cm)  Ht Readings from Last 3 Encounters:  12/26/12 5' 6.25" (1.683 m)  06/07/12 5' 6"  (1.676 m)  02/12/12 5' 7"  (1.702 m)    General appearance: alert, cooperative and  appears stated age Head: Normocephalic, without obvious abnormality, atraumatic Neck: no adenopathy, supple, symmetrical, trachea midline and thyroid normal to inspection and palpation and non-palpable Lungs: clear to auscultation bilaterally Breasts: normal appearance, no masses or tenderness, No nipple retraction or dimpling, No nipple discharge or bleeding, No axillary or supraclavicular adenopathy Heart: regular rate and rhythm Abdomen: soft, non-tender; no masses,  no organomegaly Extremities: extremities normal, atraumatic, no cyanosis or edema Skin: Skin color, texture, turgor normal. No rashes or lesions Lymph nodes: Cervical, supraclavicular, and axillary nodes normal. No abnormal inguinal  nodes palpated Neurologic: Grossly normal   Pelvic: External genitalia:  no lesions              Urethra:  normal appearing urethra with no masses, tenderness or lesions              Bartholin's and Skene's: normal                 Vagina: normal appearing vagina with normal color and grey watery discharge, no lesions wet prep taken              Cervix: normal, non tender but scant blood with wet prep              Pap taken: no Bimanual Exam:  Uterus:  normal size, contour, position, consistency, mobility, non-tender and mid position              Adnexa: normal adnexa and no mass, fullness, tenderness               Rectovaginal: Confirms               Anus:  normal sphincter tone, no lesions  Wet prep: positive for BV, negative for yeast or trich  A:  Well Woman with normal exam  Contraception OCP  BV  Fasting labs  History of HSV needs Rx for onset only  P:   Reviewed health and wellness pertinent to exam  Pap smear as per guidelines   Rx Necon 1/35 brand specific see order  Reviewed findings of BV.  Rx Metrogel see order  Labs: Lipid panel, CMP, TSH  Rx Acylovir see order  pap smear not taken today  counseled on breast self exam, use and side effects of OCP's, adequate intake of calcium and vitamin D, diet and exercise  return annually or prn  An After Visit Summary was printed and given to the patient.

## 2012-12-30 NOTE — Progress Notes (Signed)
Note reviewed, agree with plan.  Devion Chriscoe, MD  

## 2013-11-17 ENCOUNTER — Encounter: Payer: Self-pay | Admitting: Nurse Practitioner

## 2013-12-22 ENCOUNTER — Other Ambulatory Visit: Payer: Self-pay | Admitting: Certified Nurse Midwife

## 2013-12-22 NOTE — Telephone Encounter (Signed)
Medication refill request: Necon 1/35  Last AEX:  12/26/12 with Ms. [name] Next AEX: 02/03/14 with Ms. [name] Last MMG (if hormonal medication request): N/A Refill authorized: #[id number] rf

## 2014-01-28 ENCOUNTER — Ambulatory Visit (INDEPENDENT_AMBULATORY_CARE_PROVIDER_SITE_OTHER): Payer: BLUE CROSS/BLUE SHIELD | Admitting: Nurse Practitioner

## 2014-01-28 ENCOUNTER — Encounter: Payer: Self-pay | Admitting: Nurse Practitioner

## 2014-01-28 VITALS — BP 116/74 | HR 72 | Ht 66.5 in | Wt 145.0 lb

## 2014-01-28 DIAGNOSIS — B009 Herpesviral infection, unspecified: Secondary | ICD-10-CM

## 2014-01-28 DIAGNOSIS — R8290 Unspecified abnormal findings in urine: Secondary | ICD-10-CM

## 2014-01-28 DIAGNOSIS — Z0000 Encounter for general adult medical examination without abnormal findings: Secondary | ICD-10-CM

## 2014-01-28 DIAGNOSIS — Z01419 Encounter for gynecological examination (general) (routine) without abnormal findings: Secondary | ICD-10-CM

## 2014-01-28 LAB — POCT URINALYSIS DIPSTICK
Bilirubin, UA: NEGATIVE
Glucose, UA: NEGATIVE
KETONES UA: NEGATIVE
Nitrite, UA: NEGATIVE
PH UA: 7
PROTEIN UA: NEGATIVE
RBC UA: NEGATIVE
Urobilinogen, UA: NEGATIVE

## 2014-01-28 MED ORDER — NECON 1/35 (28) 1-35 MG-MCG PO TABS: 1.0000 | ORAL_TABLET | Freq: Every day | ORAL | Status: DC

## 2014-01-28 MED ORDER — ACYCLOVIR 400 MG PO TABS: 400.0000 mg | ORAL_TABLET | ORAL | Status: DC | PRN

## 2014-01-28 NOTE — Progress Notes (Signed)
Patient ID: Alison Fleming, female   DOB: Jul 19, 1975, 39 y.o.   MRN: [id number] 39 y.o. [id number] Married  Caucasian Fe here for annual exam.  Menses now at 4 days.  1 day heavy.  This past month off OCP.  Usually does best on name brand Necon..    Patient's last menstrual period was 01/20/2014 (exact date).          Sexually active: yes  The current method of family planning is OCP (estrogen/progesterone).  Exercising: yes running 2-3 times per week Smoker: no  Health Maintenance: Pap: 12/19/11, WNL, neg HR HPV TDaP: UTD Labs:  HB:  Wellness Panel at work  Urine:  Trace leuk's   reports that she has never smoked. She has never used smokeless tobacco. She reports that she drinks about 1.2 oz of alcohol per week. She reports that she does not use illicit drugs.  Past Medical History  Diagnosis Date  . Anxiety   . STD (sexually transmitted disease)     HSV 1    Past Surgical History  Procedure Laterality Date  . Spinal fusion  2008    rods on both sides of entire spinal column for scoliocis  . Dilation and curettage of uterus  2010  . Dilation and curettage of uterus  2012  . Dilatation & currettage/hysteroscopy with resectocope  02/12/2012    Procedure: [place];  Surgeon: Azalia Bilis, MD;  Location: [facility];  Service: Gynecology;  Laterality: N/A;    Current Outpatient Prescriptions  Medication Sig Dispense Refill  . acyclovir (ZOVIRAX) 400 MG tablet Take 1 tablet (400 mg total) by mouth as needed. For Herpes Zoster 20 tablet 12  . Cranberry 600 MG TABS Take 3,600 mg by mouth daily.    [name] NECON 1/35, 28, tablet TAKE ONE TABLET BY MOUTH ONE TIME DAILY  28 tablet 1  . Prenatal Vit-Fe Fumarate-FA (MULTIVITAMIN-PRENATAL) 27-0.8 MG TABS Take 1 tablet by mouth daily.    [name] acetaminophen (TYLENOL) 500 MG tablet Take 500 mg by mouth every 6 (six) hours as needed. For headache.    . ibuprofen (ADVIL,MOTRIN) 200 MG tablet Take 400 mg by  mouth every 6 (six) hours as needed. For headache/pain.     No current facility-administered medications for this visit.    ROS:  Pertinent items are noted in HPI.  Otherwise, a comprehensive ROS was negative.  Exam:   BP 116/74 mmHg  Pulse 72  Ht 5' 6.5" (1.689 m)  Wt 145 lb (65.772 kg)  BMI 23.06 kg/m2  LMP 01/20/2014 (Exact Date) Height: 5' 6.5" (168.9 cm)   General appearance: alert, cooperative and appears stated age Head: Normocephalic, without obvious abnormality, atraumatic Neck: no adenopathy, supple, symmetrical, trachea midline and thyroid normal to inspection and palpation Lungs: clear to auscultation bilaterally Breasts: normal appearance, no masses or tenderness Heart: regular rate and rhythm Abdomen: soft, non-tender; no masses,  no organomegaly Extremities: extremities normal, atraumatic, no cyanosis or edema Skin: Skin color, texture, turgor normal. No rashes or lesions Lymph nodes: Cervical, supraclavicular, and axillary nodes normal. No abnormal inguinal nodes palpated Neurologic: Grossly normal   Pelvic: External genitalia:  HSV lesion right labia, normal discolored pigment of right labia. States has been there since childrens birth.              Urethra:  normal appearing urethra with no masses, tenderness or lesions              Bartholin's and Skene's: normal  Vagina: normal appearing vagina with normal color and discharge, no lesions              Cervix: anteverted              Pap taken: Yes.   Bimanual Exam:  Uterus:  normal size, contour, position, consistency, mobility, non-tender              Adnexa: no mass, fullness, tenderness               Rectovaginal: Confirms               Anus:  normal sphincter tone, no lesions  Chaperone present: Yes  A:  Well Woman with normal exam  Contraception OCP History of HSV   History of spinal fusion for scoliosis  P:   Reviewed health and wellness pertinent to exam  Pap smear  taken today  Refill on Nordette for a year  Refill on Acyclovir for a year  Counseled on breast self exam, use and side effects of OCP's, adequate intake of calcium and vitamin D, diet and exercise, Kegel's exercises return annually or prn  An After Visit Summary was printed and given to the patient.

## 2014-01-28 NOTE — Patient Instructions (Signed)

## 2014-01-30 LAB — URINE CULTURE
COLONY COUNT: NO GROWTH
Organism ID, Bacteria: NO GROWTH

## 2014-01-30 LAB — IPS PAP TEST WITH HPV

## 2014-02-01 NOTE — Progress Notes (Signed)
Encounter reviewed by Dr. Gizzelle Lacomb Silva.  

## 2014-02-03 ENCOUNTER — Ambulatory Visit: Payer: BC Managed Care – PPO | Admitting: Certified Nurse Midwife

## 2014-05-11 ENCOUNTER — Telehealth: Payer: Self-pay | Admitting: Nurse Practitioner

## 2014-05-11 NOTE — Telephone Encounter (Signed)
Patient calling requesting to speak with the nurse about "bleeding in between periods."

## 2014-05-11 NOTE — Telephone Encounter (Signed)
Spoke with patient. Patient states she is currently taking Necon for birth control and has been experiencing spotting throughout the past couple of months in between cycles. Patient is currently in the beginning of the last week of her birth control pack. Patient started to have light spotting yesterday. Denies missing any pills or taking any pills late. "I have had some scar tissue and a polyp removed before and I just want to make sure nothing is wrong." Advised patient that break through bleeding with OCP is not uncommon. Advised if it is recurring monthly may need to make an adjustment to OCP. Patient prefers to stay on same OCP. "I just want to make sure nothing is really wrong." Advised patient will need to be seen in office for further evaluation with Alison Cage, FNP. Patient is agreeable. Appointment scheduled for 4/28 at 10:15am. Patient is agreeable to date and time.  Routing to provider for final review. Patient agreeable to disposition. Will close encounter

## 2014-05-14 ENCOUNTER — Ambulatory Visit (INDEPENDENT_AMBULATORY_CARE_PROVIDER_SITE_OTHER): Payer: BLUE CROSS/BLUE SHIELD | Admitting: Nurse Practitioner

## 2014-05-14 ENCOUNTER — Encounter: Payer: Self-pay | Admitting: Nurse Practitioner

## 2014-05-14 VITALS — BP 104/64 | HR 68 | Ht 66.5 in | Wt 145.0 lb

## 2014-05-14 DIAGNOSIS — N938 Other specified abnormal uterine and vaginal bleeding: Secondary | ICD-10-CM

## 2014-05-14 NOTE — Patient Instructions (Signed)
Dysfunctional Uterine Bleeding Normally, menstrual periods begin between ages 69 to 19 in young women. A normal menstrual cycle/period may begin every 23 days up to 35 days and lasts from 1 to 7 days. Around 12 to 14 days before your menstrual period starts, ovulation (ovary produces an egg) occurs. When counting the time between menstrual periods, count from the first day of bleeding of the previous period to the first day of bleeding of the next period. Dysfunctional (abnormal) uterine bleeding is bleeding that is different from a normal menstrual period. Your periods may come earlier or later than usual. They may be lighter, have blood clots or be heavier. You may have bleeding between periods, or you may skip one period or more. You may have bleeding after sexual intercourse, bleeding after menopause, or no menstrual period. CAUSES   Pregnancy (normal, miscarriage, tubal).  IUDs (intrauterine device, birth control).  Birth control pills.  Hormone treatment.  Menopause.  Infection of the cervix.  Blood clotting problems.  Infection of the inside lining of the uterus.  Endometriosis, inside lining of the uterus growing in the pelvis and other female organs.  Adhesions (scar tissue) inside the uterus.  Obesity or severe weight loss.  Uterine polyps inside the uterus.  Cancer of the vagina, cervix, or uterus.  Ovarian cysts or polycystic ovary syndrome.  Medical problems (diabetes, thyroid disease).  Uterine fibroids (noncancerous tumor).  Problems with your female hormones.  Endometrial hyperplasia, very thick lining and enlarged cells inside of the uterus.  Medicines that interfere with ovulation.  Radiation to the pelvis or abdomen.  Chemotherapy. DIAGNOSIS   Your doctor will discuss the history of your menstrual periods, medicines you are taking, changes in your weight, stress in your life, and any medical problems you may have.  Your doctor will do a physical  and pelvic examination.  Your doctor may want to perform certain tests to make a diagnosis, such as:  Pap test.  Blood tests.  Cultures for infection.  CT scan.  Ultrasound.  Hysteroscopy.  Laparoscopy.  MRI.  Hysterosalpingography.  D and C.  Endometrial biopsy. TREATMENT  Treatment will depend on the cause of the dysfunctional uterine bleeding (DUB). Treatment may include:  Observing your menstrual periods for a couple of months.  Prescribing medicines for medical problems, including:  Antibiotics.  Hormones.  Birth control pills.  Removing an IUD (intrauterine device, birth control).  Surgery:  D and C (scrape and remove tissue from inside the uterus).  Laparoscopy (examine inside the abdomen with a lighted tube).  Uterine ablation (destroy lining of the uterus with electrical current, laser, heat, or freezing).  Hysteroscopy (examine cervix and uterus with a lighted tube).  Hysterectomy (remove the uterus). HOME CARE INSTRUCTIONS   If medicines were prescribed, take exactly as directed. Do not change or switch medicines without consulting your caregiver.  Long term heavy bleeding may result in iron deficiency. Your caregiver may have prescribed iron pills. They help replace the iron that your body lost from heavy bleeding. Take exactly as directed.  Do not take aspirin or medicines that contain aspirin one week before or during your menstrual period. Aspirin may make the bleeding worse.  If you need to change your sanitary pad or tampon more than once every 2 hours, stay in bed with your feet elevated and a cold pack on your lower abdomen. Rest as much as possible, until the bleeding stops or slows down.  Eat well-balanced meals. Eat foods high in iron. Examples  are:  Leafy green vegetables.  Whole-grain breads and cereals.  Eggs.  Meat.  Liver.  Do not try to lose weight until the abnormal bleeding has stopped and your blood iron level is  back to normal. Do not lift more than ten pounds or do strenuous activities when you are bleeding.  For a couple of months, make note on your calendar, marking the start and ending of your period, and the type of bleeding (light, medium, heavy, spotting, clots or missed periods). This is for your caregiver to better evaluate your problem. SEEK MEDICAL CARE IF:   You develop nausea (feeling sick to your stomach) and vomiting, dizziness, or diarrhea while you are taking your medicine.  You are getting lightheaded or weak.  You have any problems that may be related to the medicine you are taking.  You develop pain with your DUB.  You want to remove your IUD.  You want to stop or change your birth control pills or hormones.  You have any type of abnormal bleeding mentioned above.  You are over 52 years old and have not had a menstrual period yet.  You are 39 years old and you are still having menstrual periods.  You have any of the symptoms mentioned above.  You develop a rash. SEEK IMMEDIATE MEDICAL CARE IF:   An oral temperature above 102 F (38.9 C) develops.  You develop chills.  You are changing your sanitary pad or tampon more than once an hour.  You develop abdominal pain.  You pass out or faint. Document Released: 12/31/1999 Document Revised: 03/27/2011 Document Reviewed: 12/01/2008 [facility] Patient Information 2015 [place], [place]. This information is not intended to replace advice given to you by your health care provider. Make sure you discuss any questions you have with your health care provider.

## 2014-05-14 NOTE — Progress Notes (Signed)
Patient ID: Alison Fleming, female   DOB: 1975-07-17, 39 y.o.   MRN: [id number] S: This 39 yo WM Female  G2P2 presents today to discuss BTB on Necon 1/35 OCP.  She has had this problem through the years and had diagnostic Hysteroscopy and Resection of Uterine Synechia on 02/12/12 by Dr. Charlies Constable.    It has been noted at this office X 4 times that she continues to have BTB.  Some of these events were related to taking her OCP a few hours late.  For the past 2 months has been very conscientious about taking same time daily and still getting BTB.  One of the worst episode was this past Sunday after having a BM and bleeding was bright red.  This bleeding can happen with SA but most usually will be closely related to a previous BM incident.  The bleeding is definitely vaginal and not rectally.    Normally menses is 3-4 days, with  moderate to heavy flow.    If a cycle has been very heavy usually the next cycle will be lighter.  No cramps.  Previous PUS is in her paper chart dated 12/29/11.  They have 2 children age 6 & 51.  They have discussed having another child but is concerned that something may be wrong.  She had 2 C sections with earlier pregnancy secondary to problems with placenta previa?  O: exam is not done on this date.   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

## 2014-05-16 NOTE — Progress Notes (Signed)
Encounter reviewed by Dr. Jovie Swanner Silva.  

## 2014-05-18 ENCOUNTER — Telehealth: Payer: Self-pay | Admitting: Nurse Practitioner

## 2014-05-18 DIAGNOSIS — N938 Other specified abnormal uterine and vaginal bleeding: Secondary | ICD-10-CM

## 2014-05-18 NOTE — Telephone Encounter (Signed)
Call to patient. Advised of benefit quote received for [facility]. Patient reluctant to schedule due to financial responsibility. Message to office administrator.

## 2014-05-19 NOTE — Telephone Encounter (Signed)
Spoke with patient. Patient started cycle yesterday and would like to schedule SHGM at this time . Patient is currently on Necon 1/35 for birth control. Will started new pack on 5/8. [facility] appointment scheduled for 5/19 at 11am with 11:30am consult with Dr.Silva. Patient is agreeable to date and time.

## 2014-05-19 NOTE — Telephone Encounter (Signed)
Call to patient. Advised of payment agreement approved by AA. Patient agreeable and grateful. Scheduled procedure.  Advised patient of 72 hour cancellation policy and $940 cancellation fee. Patient agreeable.

## 2014-06-04 ENCOUNTER — Ambulatory Visit (INDEPENDENT_AMBULATORY_CARE_PROVIDER_SITE_OTHER): Payer: BLUE CROSS/BLUE SHIELD | Admitting: Obstetrics and Gynecology

## 2014-06-04 ENCOUNTER — Ambulatory Visit (INDEPENDENT_AMBULATORY_CARE_PROVIDER_SITE_OTHER): Payer: BLUE CROSS/BLUE SHIELD

## 2014-06-04 ENCOUNTER — Other Ambulatory Visit: Payer: Self-pay | Admitting: Obstetrics and Gynecology

## 2014-06-04 VITALS — BP 98/68 | Resp 14 | Ht 66.5 in | Wt 146.0 lb

## 2014-06-04 DIAGNOSIS — N8320 Unspecified ovarian cysts: Secondary | ICD-10-CM | POA: Diagnosis not present

## 2014-06-04 DIAGNOSIS — N938 Other specified abnormal uterine and vaginal bleeding: Secondary | ICD-10-CM | POA: Diagnosis not present

## 2014-06-04 DIAGNOSIS — N856 Intrauterine synechiae: Secondary | ICD-10-CM

## 2014-06-04 DIAGNOSIS — N83201 Unspecified ovarian cyst, right side: Secondary | ICD-10-CM

## 2014-06-04 DIAGNOSIS — N939 Abnormal uterine and vaginal bleeding, unspecified: Secondary | ICD-10-CM

## 2014-06-04 NOTE — Progress Notes (Signed)
Subjective  39 y.o. G35P2012  Married  Caucasian female here for pelvic ultrasound for break through bleeding on Necon.  If takes a pill a little late, has break through.  Just wants reassurance that everything is OK.  History of hysteroscopic resection of uterine synechiae 02/12/12.  Pathology showed benign endocervical polyp, some microglandular hyperplasia.  No atypia.   Considering the possibility of future childbearing, but not immediately.  Objective  Pelvic ultrasound images and report reviewed with patient.  Uterus - no masses EMS - 2.34 mm Ovaries - right ovary with 27 x 30 mm thin walled cyst. Left ovary normal. Free fluid - no    Procedure - sonohysterogram Consent performed. Speculum placed in vagina. Sterile prep of cervix with  Betadine.  Cannula placed inside endometrial cavity without difficulty. Speculum removed. Sterile saline injected.    One          filling defect noted consistent with uterine synechiae.  No evidence of polyp or fibroid. Cannula removed. No complication.   Assessment  Breakthrough bleeding on Necon.  Uterine synechiae. Simple right ovarian cyst. Considering future childbearing.   Plan  Discussion of findings of ovarian cyst and uterine scarring.  No follow up of ovarian cyst needed.  Discussed signs and symptoms of enlarging ovarian cyst.  Discussed treatment of uterine synechiae with hysteroscopic removal if proceeds with future childbearing.  Will continue with OCPs at this time and return for usual annual exams.    ___10____ minutes face to face time of which over 50% was spent in counseling.

## 2014-06-05 ENCOUNTER — Encounter: Payer: Self-pay | Admitting: Obstetrics and Gynecology

## 2014-07-10 ENCOUNTER — Telehealth: Payer: Self-pay | Admitting: Nurse Practitioner

## 2014-12-21 ENCOUNTER — Other Ambulatory Visit: Payer: Self-pay | Admitting: Nurse Practitioner

## 2014-12-21 MED ORDER — NECON 1/35 (28) 1-35 MG-MCG PO TABS: 1.0000 | ORAL_TABLET | Freq: Every day | ORAL | Status: DC

## 2014-12-21 NOTE — Telephone Encounter (Signed)
Medication refill request: Necon Last AEX:  01/28/14 PG Next AEX: 02/02/15 PG Last MMG (if hormonal medication request): None Refill authorized: 01/28/14 #3packs/3R. Today #3pack/0R?

## 2014-12-21 NOTE — Telephone Encounter (Signed)
Patient is asking for refills of NECON 1/35, 28, tablet.   Patient is asking for Necon only due to trouble with other generics. Confirmed pharmacy with patient.

## 2014-12-27 ENCOUNTER — Other Ambulatory Visit: Payer: Self-pay | Admitting: Nurse Practitioner

## 2015-02-02 ENCOUNTER — Ambulatory Visit (INDEPENDENT_AMBULATORY_CARE_PROVIDER_SITE_OTHER): Payer: BLUE CROSS/BLUE SHIELD | Admitting: Nurse Practitioner

## 2015-02-02 ENCOUNTER — Encounter: Payer: Self-pay | Admitting: Nurse Practitioner

## 2015-02-02 VITALS — BP 100/66 | HR 64 | Ht 66.25 in | Wt 143.0 lb

## 2015-02-02 DIAGNOSIS — Z23 Encounter for immunization: Secondary | ICD-10-CM

## 2015-02-02 DIAGNOSIS — Z0000 Encounter for general adult medical examination without abnormal findings: Secondary | ICD-10-CM

## 2015-02-02 DIAGNOSIS — N949 Unspecified condition associated with female genital organs and menstrual cycle: Secondary | ICD-10-CM | POA: Diagnosis not present

## 2015-02-02 DIAGNOSIS — K5900 Constipation, unspecified: Secondary | ICD-10-CM | POA: Diagnosis not present

## 2015-02-02 DIAGNOSIS — N760 Acute vaginitis: Secondary | ICD-10-CM | POA: Diagnosis not present

## 2015-02-02 DIAGNOSIS — B009 Herpesviral infection, unspecified: Secondary | ICD-10-CM | POA: Diagnosis not present

## 2015-02-02 DIAGNOSIS — Z01419 Encounter for gynecological examination (general) (routine) without abnormal findings: Secondary | ICD-10-CM | POA: Diagnosis not present

## 2015-02-02 DIAGNOSIS — N9089 Other specified noninflammatory disorders of vulva and perineum: Secondary | ICD-10-CM

## 2015-02-02 LAB — POCT URINALYSIS DIPSTICK
BILIRUBIN UA: NEGATIVE
GLUCOSE UA: NEGATIVE
Ketones, UA: NEGATIVE
NITRITE UA: NEGATIVE
Protein, UA: NEGATIVE
RBC UA: NEGATIVE
Urobilinogen, UA: NEGATIVE
pH, UA: 5

## 2015-02-02 MED ORDER — NECON 1/35 (28) 1-35 MG-MCG PO TABS: 1.0000 | ORAL_TABLET | Freq: Every day | ORAL | Status: DC

## 2015-02-02 MED ORDER — ACYCLOVIR 400 MG PO TABS: 400.0000 mg | ORAL_TABLET | ORAL | Status: DC | PRN

## 2015-02-02 NOTE — Patient Instructions (Addendum)

## 2015-02-02 NOTE — Progress Notes (Signed)
Patient is here for AEX with Pattie.  She is due her TDap booster and requested it be given today.  She denies any complications with any other injections.  She was advised on side effects.  Patient understood and injection was administered on Left deltoid.  She was advised next booster in 10 years.  Patient tolerated the injection well.  No other concerns.

## 2015-02-02 NOTE — Progress Notes (Signed)
Patient ID: Alison Fleming, female   DOB: [birth year], 40 y.o.   MRN: [id number]  40 y.o. [id number] Married  Caucasian Fe here for annual exam.  Menses is usually 3-4 days.  Menses in December was 1 day and seemed to be shorter after a fairly large BM.  A lot of problems with constipation and desires a GI evaluation as she feels anxious about this prolonging.  She does get occasional BTB on Necon. She had evaluation with PUS 06/04/14 with Dr. 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  Patient's last menstrual period was 01/25/2015 (exact date).          Sexually active: Yes.    The current method of family planning is OCP (estrogen/progesterone).    Exercising: Yes.    Home exercise routine includes 30 minutes daily of cardio and weight lifting.  DVD's.. Smoker:  no  Health Maintenance: Pap:  01/28/14, negative with neg HR HPV TDaP:  UTD, ? 2008 HIV: declines at this time Labs:  HB: [employer] at work  Urine: trace leuk's - due to vaginitis   reports that she has never smoked. She has never used smokeless tobacco. She reports that she drinks about 1.2 oz of alcohol per week. She reports that she does not use illicit drugs.  Past Medical History  Diagnosis Date  . Anxiety   . STD (sexually transmitted disease)     HSV 1    Past Surgical History  Procedure Laterality Date  . Spinal fusion  2008    rods on both sides of entire spinal column for scoliocis  . Dilation and curettage of uterus  2010  . Dilation and curettage of uterus  2012  . Dilatation & currettage/hysteroscopy with resectocope  02/12/2012    Procedure: [place];  Surgeon: Azalia Bilis, MD;  Location: [facility];  Service: Gynecology;  Laterality: N/A;    Current Outpatient Prescriptions  Medication Sig Dispense Refill  . acyclovir (ZOVIRAX) 400 MG tablet Take 1 tablet (400 mg total) by mouth as needed. For Herpes Zoster 20 tablet 12  . Cranberry 600  MG TABS Take 3,600 mg by mouth daily.    [name] NECON 1/35, 28, tablet Take 1 tablet by mouth daily. 3 Package 4  . omeprazole (PRILOSEC) 40 MG capsule Take 1 capsule by mouth daily.    . Prenatal Vit-Fe Fumarate-FA (MULTIVITAMIN-PRENATAL) 27-0.8 MG TABS Take 1 tablet by mouth daily.     No current facility-administered medications for this visit.    Family History  Problem Relation Age of Onset  . Diabetes Maternal Grandmother   . Diabetes Maternal Grandfather   . Hypertension Mother   . Hypertension Maternal Grandmother   . Hypertension Maternal Grandfather   . Heart disease Maternal Grandmother     Heart attack  . Heart disease Maternal Grandfather     heart attack  . Heart attack Maternal Grandmother   . Heart attack Maternal Grandfather   . Anxiety disorder Mother   . Osteoporosis Maternal Grandmother     ROS:  Pertinent items are noted in HPI.  Otherwise, a comprehensive ROS was negative.  Exam:   BP 100/66 mmHg  Pulse 64  Ht 5' 6.25" (1.683 m)  Wt 143 lb (64.864 kg)  BMI 22.90 kg/m2  LMP 01/25/2015 (Exact Date) Height: 5' 6.25" (168.3 cm) Ht Readings from Last 3 Encounters:  02/02/15 5' 6.25" (1.683 m)  06/04/14 5' 6.5" (1.689 m)  05/14/14  5' 6.5" (1.689 m)    General appearance: alert, cooperative and appears stated age Head: Normocephalic, without obvious abnormality, atraumatic Neck: no adenopathy, supple, symmetrical, trachea midline and thyroid normal to inspection and palpation Lungs: clear to auscultation bilaterally Breasts: normal appearance, no masses or tenderness Heart: regular rate and rhythm Abdomen: soft, non-tender; no masses,  no organomegaly Extremities: extremities normal, atraumatic, no cyanosis or edema Skin: Skin color, texture, turgor normal. No rashes or lesions Lymph nodes: Cervical, supraclavicular, and axillary nodes normal. No abnormal inguinal nodes palpated Neurologic: Grossly normal   Pelvic: External genitalia:  no lesions except  for a firm mass right vulva that is 2 cm in size and dark discoloration. No exudate.              Urethra:  normal appearing urethra with no masses, tenderness or lesions              Bartholin's and Skene's: normal                 Vagina: normal appearing vagina with normal color and thin yellow discharge, no lesions              Cervix: anteverted no evidence of polyps              Pap taken: Yes.   Bimanual Exam:  Uterus:  normal size, contour, position, consistency, mobility, non-tender              Adnexa: no mass, fullness, tenderness               Rectovaginal: Confirms               Anus:  normal sphincter tone, no lesions     Chaperone present: yes  A:  Well Woman with normal exam  Contraception OCP History of HSV  History of spinal fusion for scoliosis  History of constipation  Mass right vulva - may need I & D vs biopsy    P:   Reviewed health and wellness pertinent to exam  Pap smear as above  Refill on Necon DAW for a year  Refill on Acyclovir for a year  Update TDAP today  Will follow with [facility]  Will get a GI referral for chronic constipation rare occasion of BRRB.  Will have her to return and see Dr. Quincy Simmonds for mass  Counseled on breast self exam, use and side effects of OCP's, adequate intake of calcium and vitamin D, diet and exercise return annually or prn  An After Visit Summary was printed and given to the patient.

## 2015-02-03 ENCOUNTER — Telehealth: Payer: Self-pay | Admitting: Obstetrics and Gynecology

## 2015-02-03 ENCOUNTER — Telehealth: Payer: Self-pay | Admitting: Nurse Practitioner

## 2015-02-03 ENCOUNTER — Other Ambulatory Visit: Payer: Self-pay | Admitting: Nurse Practitioner

## 2015-02-03 LAB — WET PREP BY MOLECULAR PROBE
CANDIDA SPECIES: NEGATIVE
Gardnerella vaginalis: POSITIVE — AB
Trichomonas vaginosis: NEGATIVE

## 2015-02-03 LAB — IPS PAP TEST WITH REFLEX TO HPV

## 2015-02-03 MED ORDER — METRONIDAZOLE 500 MG PO TABS: 500.0000 mg | ORAL_TABLET | Freq: Two times a day (BID) | ORAL | Status: DC

## 2015-02-03 MED ORDER — METRONIDAZOLE 0.75 % VA GEL: 1.0000 | Freq: Every day | VAGINAL | Status: DC

## 2015-02-03 NOTE — Telephone Encounter (Signed)
Patient was seen yesterday and is now at the pharmacy now and is requesting a less expensive medication.

## 2015-02-03 NOTE — Telephone Encounter (Signed)
Spoke with pt regarding benefit for a biopsy. Patient understood and agreeable. Patient has been scheduled on 02/08/15 with Dr Quincy Simmonds. Pt aware of arrival date and time. Pt aware of 72 hours cancellation policy with $597 fee. No further questions. Ok to close

## 2015-02-03 NOTE — Telephone Encounter (Addendum)
Spoke with patient. Patient states she went to the pharmacy today to pick up her Metrogel rx and it will be over 100 dollars. Patient is requesting an alternative medication. Rx for Flagyl 500 mg bid x 7 days #14 0RF sent to pharmacy on file. ETOH precautions given. Patient would like to schedule her vulvar biopsy with Dr.Silva as recommended by Kem Boroughs, FNP at this time. Appointment scheduled for 02/08/2015 at 10 am with Dr.Silva. Agreeable to date and time. Aware insurance department will contact her regarding benefit coverage prior to appointment.  Cc: Lerry Liner  Routing to provider for final review. Patient agreeable to disposition. Will close encounter.

## 2015-02-07 NOTE — Progress Notes (Signed)
Encounter reviewed by Dr. Brook Amundson C. Silva.  

## 2015-02-08 ENCOUNTER — Ambulatory Visit (INDEPENDENT_AMBULATORY_CARE_PROVIDER_SITE_OTHER): Payer: BLUE CROSS/BLUE SHIELD | Admitting: Obstetrics and Gynecology

## 2015-02-08 ENCOUNTER — Encounter: Payer: Self-pay | Admitting: Obstetrics and Gynecology

## 2015-02-08 DIAGNOSIS — N949 Unspecified condition associated with female genital organs and menstrual cycle: Secondary | ICD-10-CM

## 2015-02-08 DIAGNOSIS — N9089 Other specified noninflammatory disorders of vulva and perineum: Secondary | ICD-10-CM

## 2015-02-08 NOTE — Progress Notes (Signed)
Subjective:     Patient ID: Alison Fleming, female   DOB: September 13, 1975, 40 y.o.   MRN: [id number]  HPI  Patient here for vulvar biopsy of right vulvar mass 2 cm in size and dark discoloration. Mass noted at time of routine annual exam with Edman Circle.  Had had a mass for one month.  Not really painful.  No drainage.  Thinks she had this in the past.   Has "cold sores" on her bottom.  Has HSV I.     Review of Systems  LMP: 01-25-15 Contraception: Necon 1/35     Objective:   Physical Exam  Vulva - left vulva/perineum - 1 cm bluish tinged cyst, mobile, nontender.  Too low to be a Bartholins.  No connected to rectum. Urethra - normal.  Cervix and vagina - no lesions.  Uterus small and nontender.  No adnexal masses or tenderness.  Rectal exam - no masses.    Assessment:     Vulvar/periteal cyst.  I don't think this is a Bartholin's.    Plan:     Discussed possible etiologies of the cyst.  I do not recommend only biopsy.  Return for excision.  This will require local anesthesia and suturing.  I recommend patient has a driver with with that day  _____15__ minutes face to face time of which over 50% was spent in counseling.   After visit summary to patient.

## 2015-04-02 ENCOUNTER — Telehealth: Payer: Self-pay | Admitting: Nurse Practitioner

## 2015-04-02 MED ORDER — FLUCONAZOLE 150 MG PO TABS: ORAL_TABLET | ORAL | Status: DC

## 2015-04-02 NOTE — Telephone Encounter (Signed)
I will make an exception and allow Diflucan 150 mg po x 1.  Repeat in 72 hours if symptoms persist.  Please send to pharmacy of choice. Keep appointment for next week.

## 2015-04-02 NOTE — Telephone Encounter (Signed)
Spoke with patient. She c/o two days of white vaginal discharge (small amount) and vaginal itching with external vaginal irritation for two days. Married, denies concerns regarding STD exposure.  Patient declines office visit due to insurance and work obligations today, has appointment in one week 04/09/15 with Dr. Quincy Simmonds for Vulvar excision of cyst. Patient states she does not feel comfortable with vaginal insertion of OTC products and is aware that our office does not place prescriptions over the phone. She states she really feels this much like her yeast infections that Kem Boroughs, FNP has treated her for in the past and requests oral treatment, if possible before next week appointment with Dr. Quincy Simmonds.   Advised will send a message to Dr. Quincy Simmonds to review and advise regarding treatment.

## 2015-04-02 NOTE — Telephone Encounter (Signed)
Patient says she know for sure she has a yeast infection but does not want to come in because she has a high deductible. Ask if something can be called in for her.

## 2015-04-02 NOTE — Telephone Encounter (Signed)
Call to patient, notified of Dr Elza Rafter response and instructions. Rx sent and patient states she will keep appointment for next week. Encounter closed.

## 2015-04-09 ENCOUNTER — Ambulatory Visit (INDEPENDENT_AMBULATORY_CARE_PROVIDER_SITE_OTHER): Payer: BLUE CROSS/BLUE SHIELD | Admitting: Obstetrics and Gynecology

## 2015-04-09 ENCOUNTER — Encounter: Payer: Self-pay | Admitting: Obstetrics and Gynecology

## 2015-04-09 VITALS — BP 100/62 | HR 72 | Ht 66.25 in | Wt 142.0 lb

## 2015-04-09 DIAGNOSIS — R6882 Decreased libido: Secondary | ICD-10-CM

## 2015-04-09 DIAGNOSIS — N949 Unspecified condition associated with female genital organs and menstrual cycle: Secondary | ICD-10-CM

## 2015-04-09 DIAGNOSIS — N898 Other specified noninflammatory disorders of vagina: Secondary | ICD-10-CM

## 2015-04-09 DIAGNOSIS — N9089 Other specified noninflammatory disorders of vulva and perineum: Secondary | ICD-10-CM

## 2015-04-09 NOTE — Patient Instructions (Signed)
Try KY jelly, Replens, or coconut oil for the vaginal dryness.   Call if the cystic area increases in size or becomes painful.

## 2015-04-09 NOTE — Progress Notes (Signed)
Patient ID: Nasiya Pascual, female   DOB: 08-15-1975, 40 y.o.   MRN: [id number]  Chief Complaint  Patient presents with  . Vulvar Biopsy    //sp     HPI  Taygen Newsome is a 40 y.o. female.  Here for vulvar biopsy.   Prior exam in January 2017:  Vulva - right vulva/perineum - 1 cm bluish tinged cyst, mobile, nontender. Too low to be a Bartholins. No connected to rectum.  Cyst went away in mid February, and then returned.  Almost gone again now.  Not painful. Does not drain.  Not really aware that it is there unless washing.   Decreased libido. Children and 4 and 7.   Not having a lot of time with her partner.  Vaginal dryness. On Necon for many year.   Break through bleeding is now resolved.  Worries if everything is OK.  Simple right ovarian cyst 27 x 30 mm noted on ultrasound last year.    Past Medical History  Diagnosis Date  . Anxiety   . STD (sexually transmitted disease)     HSV 1    Past Surgical History  Procedure Laterality Date  . Spinal fusion  2008    rods on both sides of entire spinal column for scoliocis  . Dilation and curettage of uterus  2010  . Dilation and curettage of uterus  2012  . Dilatation & currettage/hysteroscopy with resectocope  02/12/2012    Procedure: [place];  Surgeon: Azalia Bilis, MD;  Location: [facility];  Service: Gynecology;  Laterality: N/A;    Family History  Problem Relation Age of Onset  . Diabetes Maternal Grandmother   . Diabetes Maternal Grandfather   . Hypertension Mother   . Hypertension Maternal Grandmother   . Hypertension Maternal Grandfather   . Heart disease Maternal Grandmother     Heart attack  . Heart disease Maternal Grandfather     heart attack  . Heart attack Maternal Grandmother   . Heart attack Maternal Grandfather   . Anxiety disorder Mother   . Osteoporosis Maternal Grandmother     Social History Social History  Substance Use Topics  . Smoking  status: Never Smoker   . Smokeless tobacco: Never Used  . Alcohol Use: 1.2 oz/week    2 Glasses of wine per week    No Known Allergies  Current Outpatient Prescriptions  Medication Sig Dispense Refill  . acyclovir (ZOVIRAX) 400 MG tablet Take 1 tablet (400 mg total) by mouth as needed. For Herpes Zoster 20 tablet 12  . Cranberry 600 MG TABS Take 3,600 mg by mouth daily.    [name] NECON 1/35, 28, tablet Take 1 tablet by mouth daily. 3 Package 4  . Prenatal Vit-Fe Fumarate-FA (MULTIVITAMIN-PRENATAL) 27-0.8 MG TABS Take 1 tablet by mouth daily.     No current facility-administered medications for this visit.    Review of Systems Review of Systems See above.   Blood pressure 100/62, pulse 72, height 5' 6.25" (1.683 m), weight 142 lb (64.411 kg), last menstrual period 03/22/2015.  Physical Exam Physical Exam  Right perineum with 1 cm blue tinged cyst which extends back toward the hymen.  Nontender.   No biopsy or excision performed.  Data Reviewed 03/30/15.  Assessment     Right perineal cyst.  Relatively asymptomatic and is waxing and waning.  Endometriosis?    Decreased libido.   Vaginal dryness.     Plan     Discussion of the vulvar  cystic area.  This may be endometriosis.    Options are observation versus removal in outpatient surgical setting.  I think it is really OK to observe for now.  If it becomes painful or increases in size, then plan for removal in outpatient surgical setting.  Lubricants discussed.  Encouraged increased time with partner.  Family is in town this weekend, so this is possible!  Follow up prn.   ___25____ minutes face to face time of which over 50% was spent in counseling.    After visit summary to patient.        Anvi Mangal A Quincy Simmonds 04/09/2015, 10:48 AM

## 2015-07-22 ENCOUNTER — Telehealth: Payer: Self-pay | Admitting: Nurse Practitioner

## 2015-07-22 NOTE — Telephone Encounter (Signed)
[facility] pharmacy calling regarding patient's birth control that has been discontinued. Please call with other options.

## 2015-07-22 NOTE — Telephone Encounter (Signed)
Annual exam 02-02-15 with Edman Circle, FNP. On Necon 1/35. Call to pharmacy: Denny Peon states this can be ordered for next day delivery. She will contact patient and call us back if needs to change medication.

## 2015-07-22 NOTE — Telephone Encounter (Signed)
Regine called from [facility] and said they are having problems with getting the brand of medication requested.

## 2015-07-22 NOTE — Telephone Encounter (Signed)
Call back to pharmacist. States Necon 1/35 is discontinued and there is no way to order it. They are not aware of other pharmacy that has it.  Patient will need new prescription thru 01-2016.

## 2015-07-23 NOTE — Telephone Encounter (Signed)
Please have pt to call other pharmacist and see if they have this and then can send new Rx there.

## 2015-07-23 NOTE — Telephone Encounter (Signed)
Call to patient. Advised [facility] is unable to get Necon 1/35. I spoke to them yesterday several times and received conflicting information on how long this may be an issue. Advised patient she may call other pharmacies and see if she can find it. May only be till stock is depleated if it has been discontinued.   Patient has 2 weeks left of current pack. If she is able to find medication, will have current RX transferred.  If unable to find it, she will call us back.  Encounter closed.

## 2015-07-29 ENCOUNTER — Telehealth: Payer: Self-pay | Admitting: Nurse Practitioner

## 2015-07-29 NOTE — Telephone Encounter (Signed)
See previous phone encounter from 07-22-15. Patient and pharmacy report the medication is no longer available.

## 2015-07-29 NOTE — Telephone Encounter (Signed)
Patient is calling to let the nurse know her medication has been discontinued. She would like to get another prescription for something else.Patient is using [facility] at [place].

## 2015-07-29 NOTE — Telephone Encounter (Signed)
Patient's OCP has been discontinued. She is wanting to get something close to this birth control since she has done so well on it.  I let patient know that PG is out of the office today and would be back tomorrow to review this. Patient was okay with this. eh

## 2015-07-30 MED ORDER — NORETHINDRONE-ETH ESTRADIOL 1-35 MG-MCG PO TABS
1.0000 | ORAL_TABLET | Freq: Every day | ORAL | Status: DC
Start: 2015-07-30 — End: 2015-07-30

## 2015-07-30 NOTE — Telephone Encounter (Signed)
Lets try Nortrel 1/35 and see if that is available for her.  Same med but different generic name.

## 2015-07-30 NOTE — Telephone Encounter (Signed)
Spoke with patient. Advised of message as seen below from Kem Boroughs, [place]. Patient reports she has taken Nortrel in the past and did not do well on this. "I did not feel the same. Which is why I was getting the Necon as the brand." Patient is asking what her alternative options are to switch to a new pill. Advised I will speak with Kem Boroughs, FNP and return call. She is agreeable.

## 2015-08-04 MED ORDER — NORETHIN ACE-ETH ESTRAD-FE 1-20 MG-MCG PO TABS: 1.0000 | ORAL_TABLET | Freq: Every day | ORAL | Status: DC

## 2015-08-04 NOTE — Telephone Encounter (Signed)
Spoke with patient. Advised of message as seen below from Kem Boroughs, [place]. She is agreeable and verbalizes understanding. Rx for Loestrin Fe 1/20 #3 0RF sent to pharmacy on file.  Routing to provider for final review. Patient agreeable to disposition. Will close encounter.

## 2015-08-04 NOTE — Telephone Encounter (Signed)
Lets try her on Loestrin Fe 1/20 for 3 months and see how she does.

## 2015-08-17 DIAGNOSIS — Z136 Encounter for screening for cardiovascular disorders: Secondary | ICD-10-CM | POA: Diagnosis not present

## 2015-08-17 DIAGNOSIS — Z0000 Encounter for general adult medical examination without abnormal findings: Secondary | ICD-10-CM | POA: Diagnosis not present

## 2015-08-18 DIAGNOSIS — Z23 Encounter for immunization: Secondary | ICD-10-CM | POA: Diagnosis not present

## 2015-08-18 DIAGNOSIS — Z6822 Body mass index (BMI) 22.0-22.9, adult: Secondary | ICD-10-CM | POA: Diagnosis not present

## 2015-08-18 DIAGNOSIS — Z0000 Encounter for general adult medical examination without abnormal findings: Secondary | ICD-10-CM | POA: Diagnosis not present

## 2015-08-30 ENCOUNTER — Encounter: Payer: Self-pay | Admitting: Obstetrics and Gynecology

## 2015-08-30 ENCOUNTER — Telehealth: Payer: Self-pay | Admitting: Obstetrics and Gynecology

## 2015-08-30 ENCOUNTER — Ambulatory Visit (INDEPENDENT_AMBULATORY_CARE_PROVIDER_SITE_OTHER): Payer: BLUE CROSS/BLUE SHIELD | Admitting: Obstetrics and Gynecology

## 2015-08-30 VITALS — BP 100/60 | HR 76 | Ht 66.25 in | Wt 144.4 lb

## 2015-08-30 DIAGNOSIS — N949 Unspecified condition associated with female genital organs and menstrual cycle: Secondary | ICD-10-CM

## 2015-08-30 DIAGNOSIS — N939 Abnormal uterine and vaginal bleeding, unspecified: Secondary | ICD-10-CM | POA: Diagnosis not present

## 2015-08-30 DIAGNOSIS — R102 Pelvic and perineal pain: Secondary | ICD-10-CM

## 2015-08-30 LAB — POCT URINALYSIS DIPSTICK
BILIRUBIN UA: NEGATIVE
Blood, UA: NEGATIVE
Glucose, UA: NEGATIVE
KETONES UA: NEGATIVE
LEUKOCYTES UA: NEGATIVE
NITRITE UA: NEGATIVE
PROTEIN UA: NEGATIVE
Urobilinogen, UA: NEGATIVE
pH, UA: 5

## 2015-08-30 LAB — POCT URINE PREGNANCY: Preg Test, Ur: NEGATIVE

## 2015-08-30 MED ORDER — ACYCLOVIR 400 MG PO TABS: 400.0000 mg | ORAL_TABLET | Freq: Three times a day (TID) | ORAL | 1 refills | Status: DC

## 2015-08-30 MED ORDER — NORETHINDRONE ACET-ETHINYL EST 1.5-30 MG-MCG PO TABS
1.0000 | ORAL_TABLET | Freq: Every day | ORAL | 1 refills | Status: DC
Start: 2015-08-30 — End: 2015-12-31

## 2015-08-30 NOTE — Progress Notes (Signed)
GYNECOLOGY  VISIT   HPI: 40 y.o.   Married  Caucasian  female   [phone] with Patient's last menstrual period was 08/11/2015.   here for lower pelvic pressure with RLQ discomfort with occasional bleeding after intercourse. Also complains of break through bleeding.   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   Can bleed from the vagina if she strains to have a large bowel movement.  Bleeding with intercourse immediately. This was the first time ever.  Pain with recent intercourse once. Some pain with tampon use once. Feels that her pain is more right sided.  Denies rectal bleeding  Denies dysuria.  Had hysteroscopy with resection of synechiae on 02/12/12 for breakthrough bleeding on oral contraceptives and uterine synechiae.  Dr. Charlies Constable.  Pathology - benign cervical polyp.   Had a sonohysterogram May 2106 and dx with Synechiae and simple right ovarian cyst 27 x 30 mm. Bleeding stopped Jan - April 2017.  Missed pills in the month of July - probably two. Some bloating.  Switch from Necon to [place] 1/20.  No new partners.   Want her Acyclovir Rx to not say herpes zoster on it.   Urine Dip:Neg UPT: Neg  GYNECOLOGIC HISTORY: Patient's last menstrual period was 08/11/2015. Contraception:  OCPs--[place] Menopausal hormone therapy:  n/a Last mammogram:  n/a Last pap smear:   01-28-14 Neg:Neg HR HPV        OB History    Gravida Para Term Preterm AB Living   3 2 2  0 1 2   SAB TAB Ectopic Multiple Live Births   1       2         There are no active problems to display for this patient.   Past Medical History:  Diagnosis Date  . Anxiety   . STD (sexually transmitted disease)    HSV 1    Past Surgical History:  Procedure Laterality Date  . DILATATION & CURRETTAGE/HYSTEROSCOPY WITH RESECTOCOPE  02/12/2012   Procedure: [place];  Surgeon: Azalia Bilis, MD;  Location: [facility];  Service: Gynecology;  Laterality: N/A;  . DILATION AND CURETTAGE OF  UTERUS  2010  . DILATION AND CURETTAGE OF UTERUS  2012  . SPINAL FUSION  2008   rods on both sides of entire spinal column for scoliocis    Current Outpatient Prescriptions  Medication Sig Dispense Refill  . acyclovir (ZOVIRAX) 400 MG tablet Take 1 tablet (400 mg total) by mouth as needed. For Herpes Zoster 20 tablet 12  . Cranberry 600 MG TABS Take 3,600 mg by mouth daily.    . norethindrone-ethinyl estradiol (LOESTRIN FE 1/20) 1-20 MG-MCG tablet Take 1 tablet by mouth daily. 3 Package 0  . Prenatal Vit-Fe Fumarate-FA (MULTIVITAMIN-PRENATAL) 27-0.8 MG TABS Take 1 tablet by mouth daily.     No current facility-administered medications for this visit.      ALLERGIES: Review of patient's allergies indicates no known allergies.  Family History  Problem Relation Age of Onset  . Diabetes Maternal Grandmother   . Hypertension Maternal Grandmother   . Heart disease Maternal Grandmother     Heart attack  . Heart attack Maternal Grandmother   . Osteoporosis Maternal Grandmother   . Diabetes Maternal Grandfather   . Hypertension Maternal Grandfather   . Heart disease Maternal Grandfather     heart attack  . Heart attack Maternal Grandfather   . Hypertension Mother   . Anxiety disorder Mother     Social History  Social History  . Marital status: Married    Spouse name: N/A  . Number of children: 2  . Years of education: N/A   Occupational History  .  [place]   Social History Main Topics  . Smoking status: Never Smoker  . Smokeless tobacco: Never Used  . Alcohol use 1.2 oz/week    2 Glasses of wine per week  . Drug use: No  . Sexual activity: Yes    Partners: Male    Birth control/ protection: OCP     Comment: Necon 1/35   Other Topics Concern  . Not on file   Social History Narrative  . No narrative on file    ROS:  Pertinent items are noted in HPI.  PHYSICAL EXAMINATION:    BP 100/60 (BP Location: Right Arm, Patient Position: Sitting,  Cuff Size: Normal)   Pulse 76   Ht 5' 6.25" (1.683 m)   Wt 144 lb 6.4 oz (65.5 kg)   LMP 08/11/2015 Comment: this was 1day late and very light  BMI 23.13 kg/m     General appearance: alert, cooperative and appears stated age   Abdomen: normal bowel sounds, soft, non-tender, no masses,  no organomegaly Extremities: extremities normal, atraumatic, no cyanosis or edema  No abnormal inguinal nodes palpated Neurologic: Grossly normal  Pelvic: External genitalia:   Right inferior labia majora with 0.7 cm soft cyst, nontender.              Urethra:  normal appearing urethra with no masses, tenderness or lesions              Bartholins and Skenes: normal                 Vagina: normal appearing vagina with normal color and discharge, no lesions              Cervix: no lesions                Bimanual Exam:  Uterus:  normal size, contour, position, consistency, mobility, non-tender              Adnexa: no mass, fullness, tenderness         Chaperone was present for exam.  ASSESSMENT  Breakthrough bleeding from missed OCPs and switch to new low dose pill.  UPT negative.  HSV I.  PLAN  Reassurance given regarding the etiology of her breakthrough bleeding and discomfort.  I believe she had an ovulation.  We switched her birth control to Loestrin 1.5/30 for her to begin next month.  The goal is to have the estrogen be closes to what she was taking with her Necon.  Rx for 3 packs and on refill. New Rx for Acyclovir without wording of herpes zoster on it.  Annual exam in January 2018.      An After Visit Summary was printed and given to the patient.  _15_____ minutes face to face time of which over 50% was spent in counseling.

## 2015-08-30 NOTE — Telephone Encounter (Signed)
Spoke with patient. Patient states that she is experiencing abdominal/pelvic discomfort with intercourse. Reports this is not sharp pain, but is uncomfortable. States this intermittently occurs without intercourse as well. She has had 2 episodes of bright red bleeding during and after intercourse. Reports bleeding stops shortly after. Denies any heavy bleeding. States she is also having bleeding with BM. "This is definitely vaginal not from the other area." Advised she will need to be seen in the office for further evaluation. She is agreeable. Appointment scheduled for today at 10 am with Dr.Silva. She is agreeable to date and time.  Routing to provider for final review. Patient agreeable to disposition. Will close encounter.

## 2015-08-30 NOTE — Patient Instructions (Signed)
Please call if you continue to have irregular bleeding with your new dosage of birth control pills.

## 2015-08-30 NOTE — Telephone Encounter (Signed)
Patient is having some abnormal bleeding and pain with intercourse.

## 2015-11-03 ENCOUNTER — Ambulatory Visit (INDEPENDENT_AMBULATORY_CARE_PROVIDER_SITE_OTHER): Payer: BLUE CROSS/BLUE SHIELD | Admitting: Nurse Practitioner

## 2015-11-03 ENCOUNTER — Encounter: Payer: Self-pay | Admitting: Nurse Practitioner

## 2015-11-03 ENCOUNTER — Telehealth: Payer: Self-pay | Admitting: Nurse Practitioner

## 2015-11-03 VITALS — BP 100/64 | HR 60 | Ht 66.25 in

## 2015-11-03 DIAGNOSIS — N644 Mastodynia: Secondary | ICD-10-CM

## 2015-11-03 DIAGNOSIS — N6453 Retraction of nipple: Secondary | ICD-10-CM

## 2015-11-03 NOTE — Telephone Encounter (Signed)
Pt having pain, itching and inverted nipple on left breast.

## 2015-11-03 NOTE — Patient Instructions (Signed)
Will arrange for a diagnostic evaluation.

## 2015-11-03 NOTE — Progress Notes (Signed)
   Subjective:   40 y.o. Married Caucasian female presents for evaluation of left breast mass. Onset of the symptoms was a month ago.  Pain is intermittent, fleeting and irregular.  Has a itching area around the areola but sometimes all over the breast. Patient sought evaluation because of dryness, pruritus and fleeting pain.  Contributing factors include: none. Denies fatigue. Patient denies history of trauma, bites, or injuries. Last mammogram has not been done due to age.  No other health problems.   Review of Systems Pertinent items are noted in HPI.SUBJECTIVE   Objective:   General appearance: alert, cooperative and appears stated age Head: Normocephalic, without obvious abnormality, atraumatic Neck: no adenopathy, supple, symmetrical, trachea midline and thyroid not enlarged, symmetric, no tenderness/mass/nodules Back: negative, symmetric, no curvature. ROM normal. No CVA tenderness. Lungs: clear to auscultation bilaterally Breasts: positive findings: nipple inversion on the left and fibrocystic changes, very dense breast but no mass.  The nipple inversion on the left was somewhat present per pt while breast feeding in the past - but seems worse now. Heart: regular rate and rhythm Abdomen: normal findings: no masses palpable    Assessment:   ASSESSMENT:Patient is diagnosed with fibrocystic changes and left nipple inversion   Plan:   PLAN: The patient has a documented plan to follow with further care of  on 10/25 at 10:00 am 2. PLAN: FOLLOW as needed

## 2015-11-03 NOTE — Telephone Encounter (Signed)
Spoke with patient. Patient states she has been having intermittent left breast pain for the last month. Patient reports left nipple is itchy and slightly inverted. Patient states the left nipple has been inverted since breast feeding 5 years ago. Denies any discharge or redness. Advised patient she would need to come in for OV for further evaluation. Patient scheduled for today, 11/03/15 at 12:45pm. Patient verbalizes understanding and is agreeable to date and time.   Routing to provider for final review. Patient is agreeable to disposition. Will close encounter.

## 2015-11-03 NOTE — Progress Notes (Signed)
Patient scheduled while in office for Diagnostic MMG bilateral, with left breast ultrasound for left nipple retraction and left breast pain. Patient scheduled at [place] 11/10/15 at 10:00am. Patient is agreeable to date and time.

## 2015-11-07 NOTE — Progress Notes (Signed)
Encounter reviewed by Dr. Brook Amundson C. Silva.  

## 2015-11-10 ENCOUNTER — Ambulatory Visit
Admission: RE | Admit: 2015-11-10 | Discharge: 2015-11-10 | Disposition: A | Discharge status: Home / Self Care | Payer: BLUE CROSS/BLUE SHIELD | Source: Ambulatory Visit | Attending: Nurse Practitioner | Admitting: Nurse Practitioner

## 2015-11-10 DIAGNOSIS — N6002 Solitary cyst of left breast: Secondary | ICD-10-CM | POA: Diagnosis not present

## 2015-11-10 DIAGNOSIS — N6453 Retraction of nipple: Secondary | ICD-10-CM

## 2015-11-10 DIAGNOSIS — R922 Inconclusive mammogram: Secondary | ICD-10-CM | POA: Diagnosis not present

## 2015-11-10 DIAGNOSIS — N644 Mastodynia: Secondary | ICD-10-CM

## 2015-12-31 ENCOUNTER — Other Ambulatory Visit: Payer: Self-pay | Admitting: Obstetrics and Gynecology

## 2015-12-31 NOTE — Telephone Encounter (Signed)
Medication refill request: Norethindrone Acetate-Ethiyl Estradiol Last AEX:  02/02/15 PG Next AEX: 02/15/16 PG Last MMG (if hormonal medication request): 11/10/15  Dx Bilateral & U/S of L Breast-BIRADS2 Density C, [facility] Refill authorized: 08/30/15 #3 Packages 1R. Please advise. Thank you.

## 2016-02-07 ENCOUNTER — Ambulatory Visit: Payer: BLUE CROSS/BLUE SHIELD | Admitting: Nurse Practitioner

## 2016-02-15 ENCOUNTER — Ambulatory Visit: Payer: BLUE CROSS/BLUE SHIELD | Admitting: Nurse Practitioner

## 2016-02-15 ENCOUNTER — Encounter: Payer: Self-pay | Admitting: Nurse Practitioner

## 2016-02-15 ENCOUNTER — Ambulatory Visit (INDEPENDENT_AMBULATORY_CARE_PROVIDER_SITE_OTHER): Payer: BLUE CROSS/BLUE SHIELD | Admitting: Nurse Practitioner

## 2016-02-15 VITALS — BP 120/76 | HR 68 | Ht 66.25 in | Wt 141.0 lb

## 2016-02-15 DIAGNOSIS — Z0000 Encounter for general adult medical examination without abnormal findings: Secondary | ICD-10-CM

## 2016-02-15 DIAGNOSIS — Z01419 Encounter for gynecological examination (general) (routine) without abnormal findings: Secondary | ICD-10-CM | POA: Diagnosis not present

## 2016-02-15 MED ORDER — NORETHINDRONE ACET-ETHINYL EST 1.5-30 MG-MCG PO TABS: 1.0000 | ORAL_TABLET | Freq: Every day | ORAL | 4 refills | Status: DC

## 2016-02-15 MED ORDER — ACYCLOVIR 400 MG PO TABS: 400.0000 mg | ORAL_TABLET | Freq: Three times a day (TID) | ORAL | 12 refills | Status: DC

## 2016-02-15 NOTE — Patient Instructions (Signed)

## 2016-02-15 NOTE — Progress Notes (Signed)
Patient ID: Alison Fleming, female   DOB: 1975/07/29, 41 y.o.   MRN: [id number]  41 y.o. [id number] Married  Caucasian Fe here for annual exam.  OCP was changed 08/2015 to Microgestin 1.5/30.  Less BTB but still happens random with wiping. Occasionally related to larger BM's.  Menses at 3-4 days.  Last PUS 06/04/14 with right OV cyst 27 X 30 mm and no follow up is indicated.  Patient's last menstrual period was 01/25/2016 (exact date).          Sexually active: Yes.    The current method of family planning is OCP (estrogen/progesterone).    Exercising: No. Smoker:  no  Health Maintenance: Pap: 02/02/15, Negative Mammo:  11/10/15 normal TDaP: 02/02/15 HIV: 2012 with pregnancy Labs: PCP takes   reports that she has never smoked. She has never used smokeless tobacco. She reports that she drinks about 1.2 oz of alcohol per week . She reports that she does not use drugs.  Past Medical History:  Diagnosis Date  . Anxiety   . STD (sexually transmitted disease)    HSV 1    Past Surgical History:  Procedure Laterality Date  . DILATATION & CURRETTAGE/HYSTEROSCOPY WITH RESECTOCOPE  02/12/2012   Procedure: [place];  Surgeon: Azalia Bilis, MD;  Location: [facility];  Service: Gynecology;  Laterality: N/A;  . DILATION AND CURETTAGE OF UTERUS  2010  . DILATION AND CURETTAGE OF UTERUS  2012  . SPINAL FUSION  2008   rods on both sides of entire spinal column for scoliocis    Current Outpatient Prescriptions  Medication Sig Dispense Refill  . acyclovir (ZOVIRAX) 400 MG tablet Take 1 tablet (400 mg total) by mouth 3 (three) times daily. Take for 5 days as needed. 30 tablet 1  . Cranberry 600 MG TABS Take 3,600 mg by mouth daily.    [name] MICROGESTIN 1.5-30 MG-MCG tablet TAKE 1 TABLET BY MOUTH DAILY. 2 Package 0  . Prenatal Vit-Fe Fumarate-FA (MULTIVITAMIN-PRENATAL) 27-0.8 MG TABS Take 1 tablet by mouth daily.     No current facility-administered medications for  this visit.     Family History  Problem Relation Age of Onset  . Diabetes Maternal Grandmother   . Hypertension Maternal Grandmother   . Heart disease Maternal Grandmother     Heart attack  . Heart attack Maternal Grandmother   . Osteoporosis Maternal Grandmother   . Diabetes Maternal Grandfather   . Hypertension Maternal Grandfather   . Heart disease Maternal Grandfather     heart attack  . Heart attack Maternal Grandfather   . Hypertension Mother   . Anxiety disorder Mother     ROS:  Pertinent items are noted in HPI.  Otherwise, a comprehensive ROS was negative.  Exam:   BP 120/76 (BP Location: Right Arm, Patient Position: Sitting, Cuff Size: Normal)   Pulse 68   Ht 5' 6.25" (1.683 m)   Wt 141 lb (64 kg)   LMP 01/25/2016 (Exact Date)   BMI 22.59 kg/m  Height: 5' 6.25" (168.3 cm) Ht Readings from Last 3 Encounters:  02/15/16 5' 6.25" (1.683 m)  11/03/15 5' 6.25" (1.683 m)  08/30/15 5' 6.25" (1.683 m)    General appearance: alert, cooperative and appears stated age Head: Normocephalic, without obvious abnormality, atraumatic Neck: no adenopathy, supple, symmetrical, trachea midline and thyroid normal to inspection and palpation Lungs: clear to auscultation bilaterally Breasts: normal appearance, no masses or tenderness Heart: regular rate and rhythm Abdomen: soft, non-tender; no masses,  no organomegaly Extremities: extremities normal, atraumatic, no cyanosis or edema Skin: Skin color, texture, turgor normal. No rashes or lesions Lymph nodes: Cervical, supraclavicular, and axillary nodes normal. No abnormal inguinal nodes palpated Neurologic: Grossly normal   Pelvic: External genitalia:  no lesions other than bluish tinged discoloration but no obvious cyst as before.               Urethra:  normal appearing urethra with no masses, tenderness or lesions              Bartholin's and Skene's: normal                 Vagina: normal appearing vagina with normal color  and discharge, no lesions              Cervix: anteverted              Pap taken: Yes.   per request Bimanual Exam:  Uterus:  normal size, contour, position, consistency, mobility, non-tender              Adnexa: no mass, fullness, tenderness               Rectovaginal: Confirms               Anus:  normal sphincter tone, no lesions  Chaperone present: yes  A:  Well Woman with normal exam  Contraception OCP History of HSV  History of spinal fusion for scoliosis             History of constipation               P:   Reviewed health and wellness pertinent to exam  Pap smear was done  Refill OCP for a year  Refill Acyclovir for a year  Follow with pap  Counseled on breast self exam, mammography screening, use and side effects of OCP's, adequate intake of calcium and vitamin D, diet and exercise return annually or prn  An After Visit Summary was printed and given to the patient.

## 2016-02-16 ENCOUNTER — Telehealth: Payer: Self-pay | Admitting: Nurse Practitioner

## 2016-02-16 DIAGNOSIS — N83201 Unspecified ovarian cyst, right side: Secondary | ICD-10-CM

## 2016-02-16 DIAGNOSIS — N939 Abnormal uterine and vaginal bleeding, unspecified: Secondary | ICD-10-CM

## 2016-02-16 NOTE — Progress Notes (Signed)
Encounter reviewed by Dr. Aundria Rud. If break through bleeding persists and patient is not missing any pills, I recommend a pelvic ultrasound and potential endometrial biopsy.

## 2016-02-16 NOTE — Telephone Encounter (Signed)
Pt was called as per Dr. Quincy Simmonds who would like to do another PUS with her history of BTB.  She has had a right OV cyst in the past 06/04/14.  Pt is OK with a follow up PUS and wants to get done next Thursday if possible. Pt and husband have several things going on with being out of town.

## 2016-02-17 LAB — IPS PAP TEST WITH REFLEX TO HPV

## 2016-02-18 ENCOUNTER — Telehealth: Payer: Self-pay | Admitting: Obstetrics and Gynecology

## 2016-02-18 DIAGNOSIS — N939 Abnormal uterine and vaginal bleeding, unspecified: Secondary | ICD-10-CM

## 2016-02-18 DIAGNOSIS — N83201 Unspecified ovarian cyst, right side: Secondary | ICD-10-CM

## 2016-02-18 NOTE — Telephone Encounter (Signed)
Spoke with patient. Patient states that she is due to start her menses early this weekend or next week and thinks she may be close to ending her menses on 01/24/2016. Patient is going to have PUS with possible EMB. Advised may keep appointment and monitor cycle. If she will be having spotting or ended cycle okay to proceed. If having normal flow will need to reschedule. Patient prefers to schedule for the following week to ensure she will not be on her menses. PUS and possible EMB moved to 03/02/2016 at 11 am with 11:30 am consult with Dr.Silva. Patient is agreeable to date and time.  Routing to provider for final review. Patient agreeable to disposition. Will close encounter.

## 2016-02-18 NOTE — Telephone Encounter (Signed)
Patient called and states she has an ultrasound scheduled Thursday.  Says she will be nearing the end of her period that day and wants to make sure that will not cause any problems with the test.

## 2016-02-21 DIAGNOSIS — Z23 Encounter for immunization: Secondary | ICD-10-CM | POA: Diagnosis not present

## 2016-02-24 ENCOUNTER — Other Ambulatory Visit: Payer: BLUE CROSS/BLUE SHIELD | Admitting: Obstetrics and Gynecology

## 2016-02-24 ENCOUNTER — Other Ambulatory Visit: Payer: BLUE CROSS/BLUE SHIELD

## 2016-03-01 ENCOUNTER — Other Ambulatory Visit: Payer: Self-pay | Admitting: Nurse Practitioner

## 2016-03-01 NOTE — Telephone Encounter (Signed)
Medication refill request: microgestin  Last AEX:  02/15/16 PG Next AEX: 02/23/17 PG  Last MMG (if hormonal medication request): 11/10/15 [place] Left BIRADS2:Benign  Refill authorized: 02/15/16 #3packs/4R to [facility] [place]

## 2016-03-02 ENCOUNTER — Encounter: Payer: Self-pay | Admitting: Obstetrics and Gynecology

## 2016-03-02 ENCOUNTER — Ambulatory Visit (INDEPENDENT_AMBULATORY_CARE_PROVIDER_SITE_OTHER): Payer: BLUE CROSS/BLUE SHIELD

## 2016-03-02 ENCOUNTER — Ambulatory Visit (INDEPENDENT_AMBULATORY_CARE_PROVIDER_SITE_OTHER): Payer: BLUE CROSS/BLUE SHIELD | Admitting: Obstetrics and Gynecology

## 2016-03-02 VITALS — BP 104/58 | HR 76 | Ht 66.25 in | Wt 141.0 lb

## 2016-03-02 DIAGNOSIS — N939 Abnormal uterine and vaginal bleeding, unspecified: Secondary | ICD-10-CM

## 2016-03-02 DIAGNOSIS — N907 Vulvar cyst: Secondary | ICD-10-CM | POA: Diagnosis not present

## 2016-03-02 DIAGNOSIS — N920 Excessive and frequent menstruation with regular cycle: Secondary | ICD-10-CM | POA: Diagnosis not present

## 2016-03-02 DIAGNOSIS — N83201 Unspecified ovarian cyst, right side: Secondary | ICD-10-CM | POA: Diagnosis not present

## 2016-03-02 NOTE — Progress Notes (Signed)
Encounter reviewed by Dr. Ronan Dion Amundson C. Silva.  

## 2016-03-02 NOTE — Progress Notes (Signed)
Patient ID: Alison Fleming, female   DOB: 03/09/1975, 41 y.o.   MRN: [id number] GYNECOLOGY  VISIT   HPI: 41 y.o.   Married  Caucasian  female   [phone] with Patient's last menstrual period was 02/22/2016 (exact date).   here for pelvic ultrasound for break through bleeding and possible endometrial biopsy.    Patient is having occasional spotting, which is better overall.  Happy on Microgestin. Spotting is very rare and occurs after straining to have BM or misses a pill.  Happy with taking a pill.   Patient also wants Dr.Silva to recheck her right vulvar cystic area. It has not changed and it is not bothering her, but would feel better if she took a look. This is not bothersome.   GYNECOLOGIC HISTORY: Patient's last menstrual period was 02/22/2016 (exact date). Contraception:  Microgestin Menopausal hormone therapy:  n/a Last mammogram:   11/10/15 normal Last pap smear:  02-02-15 Neg         OB History    Gravida Para Term Preterm AB Living   3 2 2  0 1 2   SAB TAB Ectopic Multiple Live Births   1 0 0 0 2         There are no active problems to display for this patient.   Past Medical History:  Diagnosis Date  . Anxiety   . STD (sexually transmitted disease)    HSV 1    Past Surgical History:  Procedure Laterality Date  . DILATATION & CURRETTAGE/HYSTEROSCOPY WITH RESECTOCOPE  02/12/2012   Procedure: [place];  Surgeon: Azalia Bilis, MD;  Location: [facility];  Service: Gynecology;  Laterality: N/A;  . DILATION AND CURETTAGE OF UTERUS  2010  . DILATION AND CURETTAGE OF UTERUS  2012  . SPINAL FUSION  2008   rods on both sides of entire spinal column for scoliocis    Current Outpatient Prescriptions  Medication Sig Dispense Refill  . acyclovir (ZOVIRAX) 400 MG tablet Take 1 tablet (400 mg total) by mouth 3 (three) times daily. Take for 5 days as needed. 30 tablet 12  . Cranberry 600 MG TABS Take 3,600 mg by mouth daily.    .  Norethindrone Acetate-Ethinyl Estradiol (MICROGESTIN) 1.5-30 MG-MCG tablet Take 1 tablet by mouth daily. 3 Package 4  . Prenatal Vit-Fe Fumarate-FA (MULTIVITAMIN-PRENATAL) 27-0.8 MG TABS Take 1 tablet by mouth daily.     No current facility-administered medications for this visit.      ALLERGIES: Patient has no known allergies.  Family History  Problem Relation Age of Onset  . Diabetes Maternal Grandmother   . Hypertension Maternal Grandmother   . Heart disease Maternal Grandmother     Heart attack  . Heart attack Maternal Grandmother   . Osteoporosis Maternal Grandmother   . Diabetes Maternal Grandfather   . Hypertension Maternal Grandfather   . Heart disease Maternal Grandfather     heart attack  . Heart attack Maternal Grandfather   . Hypertension Mother   . Anxiety disorder Mother     Social History   Social History  . Marital status: Married    Spouse name: N/A  . Number of children: 2  . Years of education: N/A   Occupational History  .  [employer]   Social History Main Topics  . Smoking status: Never Smoker  . Smokeless tobacco: Never Used  . Alcohol use 1.2 oz/week    2 Glasses of wine per week  . Drug use:  No  . Sexual activity: Yes    Partners: Male    Birth control/ protection: OCP     Comment: [place] 1/35   Other Topics Concern  . Not on file   Social History Narrative  . No narrative on file    ROS:  Pertinent items are noted in HPI.  PHYSICAL EXAMINATION:    BP (!) 104/58 (BP Location: Right Arm, Patient Position: Sitting, Cuff Size: Normal)   Pulse 76   Ht 5' 6.25" (1.683 m)   Wt 141 lb (64 kg)   LMP 02/22/2016 (Exact Date)   BMI 22.59 kg/m     General appearance: alert, cooperative and appears stated age    Pelvic: External genitalia:  no lesions              Urethra:  normal appearing urethra with no masses, tenderness or lesions              Bartholins and Skenes: right Bartholin's cyst versus vulvar cyst 7 mm.   It is slightly more inferior to Bartholin's usual location.  Nontender and no erythema.              Pelvic ultrasound Uterus no myometrial masses.  EMS 1.23 mm.  Ovaries normal.  Left ovary with 18 mm CL? No free fluid.   Chaperone was present for exam.  ASSESSMENT  Spotting on combined OCPs.  Unremarkable pelvic ultrasound.  Vulvar cyst.  This may actually be a Bartholin's.   PLAN  Reassurance regarding pelvic ultrasound today.  Continue combined oral contraceptives.  Observation of the vulvar cyst.  I do not recommend removal.  Follow up for routine care and prn.    An After Visit Summary was printed and given to the patient.  _15_____ minutes face to face time of which over 50% was spent in counseling.

## 2016-05-03 ENCOUNTER — Emergency Department (HOSPITAL_COMMUNITY): Payer: BLUE CROSS/BLUE SHIELD

## 2016-05-03 ENCOUNTER — Emergency Department (HOSPITAL_COMMUNITY)
Admission: EM | Admit: 2016-05-03 | Discharge: 2016-05-03 | Disposition: A | Discharge status: Home / Self Care | Payer: BLUE CROSS/BLUE SHIELD | Attending: Emergency Medicine | Admitting: Emergency Medicine

## 2016-05-03 ENCOUNTER — Encounter (HOSPITAL_COMMUNITY): Payer: Self-pay | Admitting: *Deleted

## 2016-05-03 ENCOUNTER — Telehealth: Payer: Self-pay | Admitting: Family Medicine

## 2016-05-03 ENCOUNTER — Telehealth: Payer: Self-pay | Admitting: General Practice

## 2016-05-03 DIAGNOSIS — Z79899 Other long term (current) drug therapy: Secondary | ICD-10-CM | POA: Diagnosis not present

## 2016-05-03 DIAGNOSIS — R079 Chest pain, unspecified: Secondary | ICD-10-CM | POA: Diagnosis not present

## 2016-05-03 DIAGNOSIS — R0789 Other chest pain: Secondary | ICD-10-CM | POA: Diagnosis not present

## 2016-05-03 LAB — CBC
HEMATOCRIT: 38.6 % (ref 36.0–46.0)
HEMOGLOBIN: 13.2 g/dL (ref 12.0–15.0)
MCH: 30.4 pg (ref 26.0–34.0)
MCHC: 34.2 g/dL (ref 30.0–36.0)
MCV: 88.9 fL (ref 78.0–100.0)
Platelets: 274 10*3/uL (ref 150–400)
RBC: 4.34 MIL/uL (ref 3.87–5.11)
RDW: 13.2 % (ref 11.5–15.5)
WBC: 7.2 10*3/uL (ref 4.0–10.5)

## 2016-05-03 LAB — BASIC METABOLIC PANEL
ANION GAP: 9 (ref 5–15)
BUN: 10 mg/dL (ref 6–20)
CALCIUM: 9.5 mg/dL (ref 8.9–10.3)
CO2: 25 mmol/L (ref 22–32)
Chloride: 104 mmol/L (ref 101–111)
Creatinine, Ser: 0.77 mg/dL (ref 0.44–1.00)
GFR calc Af Amer: >60 mL/min (ref >60)
GLUCOSE: 96 mg/dL (ref 65–99)
POTASSIUM: 3.8 mmol/L (ref 3.5–5.1)
Sodium: 138 mmol/L (ref 135–145)

## 2016-05-03 LAB — I-STAT TROPONIN, ED
Troponin i, poc: 0 ng/mL (ref 0.00–0.08)
Troponin i, poc: 0 ng/mL (ref 0.00–0.08)

## 2016-05-03 LAB — D-DIMER, QUANTITATIVE: D-Dimer, Quant: 0.36 ug/mL-FEU (ref 0.00–0.50)

## 2016-05-03 NOTE — Telephone Encounter (Signed)
Patient Name: Alison Fleming DOB: [birth year] Initial Comment Caller states shortness of breath, chest pain. Dizziness, tired. Nurse Assessment Nurse: Chesley Noon, RN, Lattie Haw Date/Time Eilene Ghazi Time): 05/03/2016 8:11:23 AM Confirm and document reason for call. If symptomatic, describe symptoms. ---Caller states she has had chest pain, shortness of breath, fatigue and dizziness. She did have a panic attack about 3 weeks ago, she had EMS come. She had never had a panic attack before. Dizziness started yesterday. Chest pain has been constant, mild shortness of breath but no "trouble breathing". Fatigue also started yesterday. Does the patient have any new or worsening symptoms? ---Yes Will a triage be completed? ---Yes Related visit to physician within the last 2 weeks? ---No Does the PT have any chronic conditions? (i.e. diabetes, asthma, etc.) ---No Is the patient pregnant or possibly pregnant? (Ask all females between the ages of 59-55) ---No Is this a behavioral health or substance abuse call? ---No Guidelines Guideline Title Affirmed Question Affirmed Notes Chest Pain [1] Chest pain lasts > 5 minutes AND [2] described as crushing, pressure-like, or heavy Final Disposition User Call EMS 911 Now Chesley Noon, RN, Lattie Haw Disagree/Comply: Disagree Disagree/Comply Reason: Disagree with instructions

## 2016-05-03 NOTE — Telephone Encounter (Signed)
Patient called in to request a sooner appointment than next Monday. Rescheduled to tomorrow at 2 pm.   Patient complaining of chest pains/ SOB/ intermittently. No HX.   Transferred to team health. Advised patient of closest [facility] ED with the lowest census [facility])  Warm xfer to team health.

## 2016-05-03 NOTE — ED Notes (Signed)
Hooked patient to the monitor patient is resting

## 2016-05-03 NOTE — ED Notes (Signed)
Patient transported to X-ray 

## 2016-05-03 NOTE — Telephone Encounter (Signed)
Spoke to patient. She has arrived to [facility] ED.

## 2016-05-03 NOTE — ED Provider Notes (Signed)
[facility] DEPT Provider Note   CSN: [id number] Arrival date & time: 05/03/16  4650     History   Chief Complaint Chief Complaint  Patient presents with  . Chest Pain    HPI Alison Fleming is a 41 y.o. female.  HPI  Pt presenting with c/o intermittent sharp chest pains.  She also feels some occasional palpitations that make her feel like taking a deep breath.  No difficulty breathing.  No nausea, no radiation, no diaphoresis.  Symptoms are not associated with exertion.  No cough or fever.  No leg swelling.  No hx of DVT/PE.  No recent travel/trauma/surgery- she does take OCPs.  Pt has not had any treatment for her symptoms.  No primary relatives with early MI.  There are no other associated systemic symptoms, there are no other alleviating or modifying factors.   Past Medical History:  Diagnosis Date  . Anxiety   . STD (sexually transmitted disease)    HSV 1    There are no active problems to display for this patient.   Past Surgical History:  Procedure Laterality Date  . DILATATION & CURRETTAGE/HYSTEROSCOPY WITH RESECTOCOPE  02/12/2012   Procedure: [place];  Surgeon: Azalia Bilis, MD;  Location: [facility];  Service: Gynecology;  Laterality: N/A;  . DILATION AND CURETTAGE OF UTERUS  2010  . DILATION AND CURETTAGE OF UTERUS  2012  . SPINAL FUSION  2008   rods on both sides of entire spinal column for scoliocis    OB History    Gravida Para Term Preterm AB Living   3 2 2  0 1 2   SAB TAB Ectopic Multiple Live Births   1 0 0 0 2       Home Medications    Prior to Admission medications   Medication Sig Start Date End Date Taking? Authorizing Provider  acyclovir (ZOVIRAX) 400 MG tablet Take 1 tablet (400 mg total) by mouth 3 (three) times daily. Take for 5 days as needed. 02/15/16   Kem Boroughs, FNP  Cranberry 600 MG TABS Take 3,600 mg by mouth daily.    Historical Provider, MD  Norethindrone Acetate-Ethinyl Estradiol  (MICROGESTIN) 1.5-30 MG-MCG tablet Take 1 tablet by mouth daily. 02/15/16   Kem Boroughs, FNP  Prenatal Vit-Fe Fumarate-FA (MULTIVITAMIN-PRENATAL) 27-0.8 MG TABS Take 1 tablet by mouth daily.    Historical Provider, MD    Family History Family History  Problem Relation Age of Onset  . Diabetes Maternal Grandmother   . Hypertension Maternal Grandmother   . Heart disease Maternal Grandmother     Heart attack  . Heart attack Maternal Grandmother   . Osteoporosis Maternal Grandmother   . Diabetes Maternal Grandfather   . Hypertension Maternal Grandfather   . Heart disease Maternal Grandfather     heart attack  . Heart attack Maternal Grandfather   . Hypertension Mother   . Anxiety disorder Mother     Social History Social History  Substance Use Topics  . Smoking status: Never Smoker  . Smokeless tobacco: Never Used  . Alcohol use 1.2 oz/week    2 Glasses of wine per week     Allergies   Patient has no known allergies.   Review of Systems Review of Systems  ROS reviewed and all otherwise negative except for mentioned in HPI   Physical Exam Updated Vital Signs BP 114/68   Pulse 84   Temp 98 F (36.7 C) (Oral)   Resp 18   Ht  5' 7"  (1.702 m)   Wt 61.2 kg   LMP 04/19/2016 Comment: 2 weeks ago LMP  SpO2 98%   BMI 21.14 kg/m  Vitals reviewed Physical Exam Physical Examination: General appearance - alert, well appearing, and in no distress Mental status - alert, oriented to person, place, and time Eyes -no conjunctival injection, no scleral icterus Chest - clear to auscultation, no wheezes, rales or rhonchi, symmetric air entry Heart - normal rate, regular rhythm, normal S1, S2, no murmurs, rubs, clicks or gallops Abdomen - soft, nontender, nondistended, no masses or organomegaly Neurological - alert, oriented, normal speech Extremities - peripheral pulses normal, no pedal edema, no clubbing or cyanosis Skin - normal coloration and turgor, no rashes  ED  Treatments / Results  Labs (all labs ordered are listed, but only abnormal results are displayed) Labs Reviewed  BASIC METABOLIC PANEL  CBC  D-DIMER, QUANTITATIVE (NOT AT [facility])  I-STAT TROPOININ, ED  I-STAT TROPOININ, ED    EKG  EKG Interpretation  Date/Time:  Wednesday May 03 2016 09:25:19 EDT Ventricular Rate:  95 PR Interval:  140 QRS Duration: 86 QT Interval:  336 QTC Calculation: 422 R Axis:   72 Text Interpretation:  Sinus rhythm with Fusion complexes and Premature atrial complexes with Abberant conduction Cannot rule out Anterior infarct , age undetermined T wave abnormality, consider inferior ischemia Abnormal ECG No old tracing to compare Confirmed by [facility]  MD, Loic Hobin [phone]) on 05/03/2016 10:28:33 AM       Radiology Dg Chest 2 View  Result Date: 05/03/2016 CLINICAL DATA:  Chest pain EXAM: CHEST  2 VIEW COMPARISON:  None. FINDINGS: Bilateral posterior thoracolumbar spinal fusion hardware is noted from T4-L3. Mild long segment dextroscoliosis of the thoracolumbar spine. Normal heart size. Normal mediastinal contour. No pneumothorax. No pleural effusion. Lungs appear clear, with no acute consolidative airspace disease and no pulmonary edema. IMPRESSION: No active cardiopulmonary disease. Electronically Signed   By: Ilona Sorrel M.D.   On: 05/03/2016 10:23    Procedures Procedures (including critical care time)  Medications Ordered in ED Medications - No data to display   Initial Impression / Assessment and Plan / ED Course  I have reviewed the triage vital signs and the nursing notes.  Pertinent labs & imaging results that were available during my care of the patient were reviewed by me and considered in my medical decision making (see chart for details).     Pt presnting with intermittent chest pains over the past several days.  She has a heart score of 1 due to some nonspecific EKG changes and PERC of 1 due to taking OCPS- her d-dimer was negative making her  very low risk for PE, she had 2 troponins checked and these were both negative which makes ACS very unlikely in her case. She is anxious about her symptoms, I feel anxiety may be a component of this.  Her questions were answered to the best of my ability.  Advised f/u with PMD.  Discharged with strict return precautions.  Pt agreeable with plan.  Final Clinical Impressions(s) / ED Diagnoses   Final diagnoses:  Nonspecific chest pain    New Prescriptions Discharge Medication List as of 05/03/2016  1:26 PM       Alfonzo Beers, MD 05/03/16 1757

## 2016-05-03 NOTE — Discharge Instructions (Signed)
Return to the ED with any concerns including difficulty breathing, fainting, leg swelling, decreased level of alertness/lethargy, or any other alarming symptoms

## 2016-05-03 NOTE — Telephone Encounter (Signed)
9:28am: Attempted to contact patient. Left voicemail to call office back. Wanting to ensure stable for appointment tomorrow at 2:00pm. Awaiting call back.

## 2016-05-03 NOTE — ED Triage Notes (Addendum)
PT states she has been having intermittent chest pains for several days, reports sob, lightheaded, fatigued.  Pt states pain is sharp to dull chest pain and radiates through to back. Both grandparents had heart attacks in their 62s

## 2016-05-04 ENCOUNTER — Ambulatory Visit: Payer: BLUE CROSS/BLUE SHIELD | Admitting: Family Medicine

## 2016-05-05 ENCOUNTER — Ambulatory Visit: Payer: BLUE CROSS/BLUE SHIELD | Admitting: Family Medicine

## 2016-05-08 ENCOUNTER — Ambulatory Visit: Payer: BLUE CROSS/BLUE SHIELD | Admitting: Family Medicine

## 2016-05-08 ENCOUNTER — Ambulatory Visit (INDEPENDENT_AMBULATORY_CARE_PROVIDER_SITE_OTHER): Payer: BLUE CROSS/BLUE SHIELD | Admitting: Family Medicine

## 2016-05-08 ENCOUNTER — Encounter: Payer: Self-pay | Admitting: Family Medicine

## 2016-05-08 VITALS — BP 102/64 | HR 75 | Temp 98.0°F | Ht 66.25 in | Wt 141.8 lb

## 2016-05-08 DIAGNOSIS — F411 Generalized anxiety disorder: Secondary | ICD-10-CM | POA: Diagnosis not present

## 2016-05-08 DIAGNOSIS — R0789 Other chest pain: Secondary | ICD-10-CM | POA: Diagnosis not present

## 2016-05-08 MED ORDER — SERTRALINE HCL 50 MG PO TABS: 50.0000 mg | ORAL_TABLET | Freq: Every day | ORAL | 3 refills | Status: DC

## 2016-05-08 NOTE — Progress Notes (Signed)
Alison Fleming is a 41 y.o. female is here to Alison Fleming.   History of Present Illness:   Alison Fleming CMA acting as scribe for Dr. Juleen Fleming.  Chest Pain   The current episode started in the past 7 days. The problem has been resolved. Associated symptoms include back pain, a cough, malaise/fatigue and palpitations. Pertinent negatives include no abdominal pain, claudication, diaphoresis, dizziness, exertional chest pressure, fever, headaches, hemoptysis, irregular heartbeat, leg pain, lower extremity edema, nausea, near-syncope, numbness, orthopnea, PND, shortness of breath, sputum production, syncope, vomiting or weakness.  Her past medical history is significant for anxiety/panic attacks.  Anxiety  Presents for initial visit. Onset was 6 to 12 months ago. The problem has been unchanged. Symptoms include chest pain, excessive worry, nervous/anxious behavior, palpitations and panic. Patient reports no dizziness, nausea, shortness of breath or suicidal ideas. Symptoms occur most days. The severity of symptoms is moderate. The quality of sleep is fair. Nighttime awakenings: occasional.   Her past medical history is significant for anxiety/panic attacks. Past treatments include nothing.    There are no preventive care reminders to display for this patient.  PMHx, SurgHx, SocialHx, Medications, and Allergies were reviewed in the Visit Navigator and updated as appropriate.   Past Medical History:  Diagnosis Date  . Anxiety   . Seasonal allergies   . STD (sexually transmitted disease)    HSV 1    Past Surgical History:  Procedure Laterality Date  . DILATATION & CURRETTAGE/HYSTEROSCOPY WITH RESECTOCOPE  02/12/2012   Procedure: [place];  Surgeon: Azalia Bilis, MD;  Location: [facility];  Service: Gynecology;  Laterality: N/A;  . DILATION AND CURETTAGE OF UTERUS  2010  . DILATION AND CURETTAGE OF UTERUS  2012  . SPINAL FUSION  2008   rods on  both sides of entire spinal column for scoliocis    Family History  Problem Relation Age of Onset  . Diabetes Maternal Grandmother   . Hypertension Maternal Grandmother   . Heart disease Maternal Grandmother     Heart attack  . Heart attack Maternal Grandmother   . Osteoporosis Maternal Grandmother   . Diabetes Maternal Grandfather   . Hypertension Maternal Grandfather   . Heart disease Maternal Grandfather     heart attack  . Heart attack Maternal Grandfather   . Hypertension Mother   . Anxiety disorder Mother    Social History  Substance Use Topics  . Smoking status: Never Smoker  . Smokeless tobacco: Never Used  . Alcohol use 1.2 oz/week    2 Glasses of wine per week   Current Medications and Allergies:   Current Outpatient Prescriptions:  .  acyclovir (ZOVIRAX) 400 MG tablet, Take 1 tablet (400 mg total) by mouth 3 (three) times daily. Take for 5 days as needed., Disp: 30 tablet, Rfl: 12 .  Cranberry 600 MG TABS, Take 3,600 mg by mouth daily., Disp: , Rfl:  .  Norethindrone Acetate-Ethinyl Estradiol (MICROGESTIN) 1.5-30 MG-MCG tablet, Take 1 tablet by mouth daily., Disp: 3 Package, Rfl: 4 .  Prenatal Vit-Fe Fumarate-FA (MULTIVITAMIN-PRENATAL) 27-0.8 MG TABS, Take 1 tablet by mouth daily., Disp: , Rfl:   No Known Allergies   Review of Systems:   Review of Systems  Constitutional: Positive for malaise/fatigue. Negative for chills, diaphoresis and fever.  HENT: Negative for congestion, ear pain, sinus pain and sore throat.   Eyes: Negative for blurred vision and double vision.  Respiratory: Positive for cough. Negative for hemoptysis, sputum production, shortness of  breath and wheezing.        Cough X 1 week   Cardiovascular: Positive for chest pain and palpitations. Negative for orthopnea, claudication, syncope, PND and near-syncope.       Chest is getting better since ED visit  Gastrointestinal: Negative for abdominal pain, constipation, diarrhea, nausea and vomiting.   Genitourinary: Negative for dysuria.  Musculoskeletal: Positive for back pain. Negative for joint pain and neck pain.  Skin: Negative for itching and rash.  Neurological: Negative for dizziness, weakness, numbness and headaches.  Psychiatric/Behavioral: Negative for depression, hallucinations, memory loss, substance abuse and suicidal ideas. The patient is nervous/anxious.    Vitals:   Vitals:   05/08/16 1343  BP: 102/64  Pulse: 75  Temp: 98 F (36.7 C)  TempSrc: Oral  SpO2: 98%  Weight: 141 lb 12.8 oz (64.3 kg)  Height: 5' 6.25" (1.683 m)     Body mass index is 22.71 kg/m.  Physical Exam:   Physical Exam  Constitutional: She is oriented to person, place, and time. She appears well-developed and well-nourished.  HENT:  Head: Normocephalic and atraumatic.  Right Ear: External ear normal.  Left Ear: External ear normal.  Nose: Nose normal.  Mouth/Throat: Oropharynx is clear and moist.  Eyes: Conjunctivae and EOM are normal. Pupils are equal, round, and reactive to light.  Neck: Normal range of motion. Neck supple. No thyromegaly present.  Cardiovascular: Normal rate, regular rhythm, normal heart sounds and intact distal pulses.   Pulmonary/Chest: Effort normal and breath sounds normal.  Abdominal: Soft. Bowel sounds are normal.  Neurological: She is alert and oriented to person, place, and time.  Skin: Skin is warm.  Psychiatric: She has a normal mood and affect. Her behavior is normal. Thought content normal.  Nursing note and vitals reviewed.  EKG: normal EKG, normal sinus rhythm.  Assessment and Plan:    Alison Fleming was seen today for establish care and follow-up.  Diagnoses and all orders for this visit:  Atypical chest pain -     EKG 12-Lead  GAD (generalized anxiety disorder) -     sertraline (ZOLOFT) 50 MG tablet; Take 1 tablet (50 mg total) by mouth at bedtime. 1/2 tab q hs   . Reviewed expectations re: course of current medical issues. . Discussed  self-management of symptoms. . Outlined signs and symptoms indicating need for more acute intervention. . Patient verbalized understanding and all questions were answered. . See orders for this visit as documented in the electronic medical record. . Patient received an After Visit Summary.  Records requested if needed. I spent 30 minutes with this patient, greater than 50% was face-to-face time counseling regarding the above diagnoses.  CMA served as Education administrator during this visit. History, Physical, and Plan performed by medical provider. Documentation and orders reviewed and attested to. [name], D.O.  [name], [place], [place] 05/10/2016   Follow-up: Return in about 3 months (around 08/07/2016).  Meds ordered this encounter  Medications  . sertraline (ZOLOFT) 50 MG tablet    Sig: Take 1 tablet (50 mg total) by mouth at bedtime. 1/2 tab q hs    Dispense:  30 tablet    Refill:  3   There are no discontinued medications. Orders Placed This Encounter  Procedures  . EKG 12-Lead

## 2016-05-08 NOTE — Progress Notes (Signed)
Pre visit review using our clinic review tool, if applicable. No additional management support is needed unless otherwise documented below in the visit note. 

## 2016-05-10 ENCOUNTER — Encounter: Payer: Self-pay | Admitting: Family Medicine

## 2016-05-10 DIAGNOSIS — F411 Generalized anxiety disorder: Secondary | ICD-10-CM

## 2016-05-10 HISTORY — DX: Generalized anxiety disorder: F41.1

## 2016-06-06 ENCOUNTER — Other Ambulatory Visit: Payer: Self-pay

## 2016-06-06 DIAGNOSIS — F411 Generalized anxiety disorder: Secondary | ICD-10-CM

## 2016-06-06 MED ORDER — SERTRALINE HCL 50 MG PO TABS: 50.0000 mg | ORAL_TABLET | Freq: Every day | ORAL | 0 refills | Status: DC

## 2016-07-11 DIAGNOSIS — L814 Other melanin hyperpigmentation: Secondary | ICD-10-CM | POA: Diagnosis not present

## 2016-07-11 DIAGNOSIS — L821 Other seborrheic keratosis: Secondary | ICD-10-CM | POA: Diagnosis not present

## 2016-07-11 DIAGNOSIS — D225 Melanocytic nevi of trunk: Secondary | ICD-10-CM | POA: Diagnosis not present

## 2016-07-25 ENCOUNTER — Encounter: Payer: Self-pay | Admitting: Family Medicine

## 2016-07-25 ENCOUNTER — Ambulatory Visit (INDEPENDENT_AMBULATORY_CARE_PROVIDER_SITE_OTHER): Payer: BLUE CROSS/BLUE SHIELD | Admitting: Family Medicine

## 2016-07-25 VITALS — BP 120/74 | HR 74 | Temp 98.1°F | Ht 66.25 in | Wt 143.6 lb

## 2016-07-25 DIAGNOSIS — R5383 Other fatigue: Secondary | ICD-10-CM

## 2016-07-25 DIAGNOSIS — R7989 Other specified abnormal findings of blood chemistry: Secondary | ICD-10-CM

## 2016-07-25 DIAGNOSIS — L659 Nonscarring hair loss, unspecified: Secondary | ICD-10-CM

## 2016-07-25 DIAGNOSIS — R1013 Epigastric pain: Secondary | ICD-10-CM | POA: Diagnosis not present

## 2016-07-25 DIAGNOSIS — Z0000 Encounter for general adult medical examination without abnormal findings: Secondary | ICD-10-CM

## 2016-07-25 DIAGNOSIS — Z0001 Encounter for general adult medical examination with abnormal findings: Secondary | ICD-10-CM | POA: Diagnosis not present

## 2016-07-25 DIAGNOSIS — E559 Vitamin D deficiency, unspecified: Secondary | ICD-10-CM | POA: Diagnosis not present

## 2016-07-25 LAB — LIPID PANEL
Cholesterol: 170 mg/dL (ref 0–200)
HDL: 60.8 mg/dL (ref >39.00)
LDL Cholesterol: 86 mg/dL (ref 0–99)
NonHDL: 108.87
Total CHOL/HDL Ratio: 3
Triglycerides: 112 mg/dL (ref 0.0–149.0)
VLDL: 22.4 mg/dL (ref 0.0–40.0)

## 2016-07-25 LAB — CBC WITH DIFFERENTIAL/PLATELET
Basophils Absolute: 0.1 10*3/uL (ref 0.0–0.1)
Basophils Relative: 0.9 % (ref 0.0–3.0)
Eosinophils Absolute: 0.1 10*3/uL (ref 0.0–0.7)
Eosinophils Relative: 1.5 % (ref 0.0–5.0)
HCT: 38.5 % (ref 36.0–46.0)
Hemoglobin: 13.2 g/dL (ref 12.0–15.0)
Lymphocytes Relative: 23.7 % (ref 12.0–46.0)
Lymphs Abs: 1.7 10*3/uL (ref 0.7–4.0)
MCHC: 34.2 g/dL (ref 30.0–36.0)
MCV: 90.4 fl (ref 78.0–100.0)
Monocytes Absolute: 0.5 10*3/uL (ref 0.1–1.0)
Monocytes Relative: 6.5 % (ref 3.0–12.0)
Neutro Abs: 4.9 10*3/uL (ref 1.4–7.7)
Neutrophils Relative %: 67.4 % (ref 43.0–77.0)
Platelets: 268 10*3/uL (ref 150.0–400.0)
RBC: 4.26 Mil/uL (ref 3.87–5.11)
RDW: 12.5 % (ref 11.5–15.5)
WBC: 7.3 10*3/uL (ref 4.0–10.5)

## 2016-07-25 LAB — COMPREHENSIVE METABOLIC PANEL
ALT: 13 U/L (ref 0–35)
AST: 14 U/L (ref 0–37)
Albumin: 4.3 g/dL (ref 3.5–5.2)
Alkaline Phosphatase: 35 U/L — ABNORMAL LOW (ref 39–117)
BUN: 12 mg/dL (ref 6–23)
CO2: 25 mEq/L (ref 19–32)
Calcium: 9.3 mg/dL (ref 8.4–10.5)
Chloride: 102 mEq/L (ref 96–112)
Creatinine, Ser: 0.75 mg/dL (ref 0.40–1.20)
GFR: 91.15 mL/min (ref >60.00)
Glucose, Bld: 88 mg/dL (ref 70–99)
Potassium: 4 mEq/L (ref 3.5–5.1)
Sodium: 136 mEq/L (ref 135–145)
Total Bilirubin: 0.6 mg/dL (ref 0.2–1.2)
Total Protein: 7.3 g/dL (ref 6.0–8.3)

## 2016-07-25 LAB — H. PYLORI ANTIBODY, IGG: H Pylori IgG: NEGATIVE

## 2016-07-25 LAB — TSH: TSH: 1.25 u[IU]/mL (ref 0.35–4.50)

## 2016-07-25 LAB — VITAMIN D 25 HYDROXY (VIT D DEFICIENCY, FRACTURES): VITD: 40.12 ng/mL (ref 30.00–100.00)

## 2016-07-25 LAB — VITAMIN B12: Vitamin B-12: 789 pg/mL (ref 211–911)

## 2016-07-25 MED ORDER — RANITIDINE HCL 150 MG PO TABS: 150.0000 mg | ORAL_TABLET | Freq: Two times a day (BID) | ORAL | 2 refills | Status: DC

## 2016-07-25 NOTE — Progress Notes (Signed)
Subjective:    Alison Fleming CMA acting as scribe for Dr. Juleen Fleming.  Alison Fleming is a 41 y.o. female and is here for a comprehensive physical exam.  HPI:  1. Hair loss. Ongoing for over one year. Noticed by others. Denies pulling or picking. No itching or rash. No treatment.    2. Abdominal Pain Patient complains of abdominal pain. The pain is described as aching and dull, and is 4/10 in intensity. The patient is experiencing epigastric pain with radiation to the back. Onset was a few months ago. Symptoms have been unchanged. Aggravating factors: eating and recumbency.  Alleviating factors: none. Associated symptoms: none. The patient denies anorexia, arthralagias, belching, chills, constipation, diarrhea, fever, flatus, frequency, headache, hematochezia, hematuria, melena, myalgias, sweats and vomiting.   3. Fatigue Patient complains of fatigue. Symptoms began several weeks ago. The patient feels the fatigue began with: change in hair texture. Symptoms of her fatigue have been general malaise. Patient describes the following psychological symptoms: none. Patient denies cold intolerance, constipation, excessive menstrual bleeding, exercise intolerance, fever, GI blood loss, significant change in weight, symptoms of arthritis, unusual rashes and witnessed or suspected sleep apnea. Symptoms have progressed to a point and plateaued. Symptom severity: moderate. Previous visits for this problem: none.    PMHx, SurgHx, SocialHx, Medications, and Allergies were reviewed in the Visit Navigator and updated as appropriate.   Past Medical History:  Diagnosis Date  . Anxiety   . Seasonal allergies    Past Surgical History:  Procedure Laterality Date  . DILATATION & CURRETTAGE/HYSTEROSCOPY WITH RESECTOCOPE  02/12/2012  . DILATION AND CURETTAGE OF UTERUS  2010  . DILATION AND CURETTAGE OF UTERUS  2012  . SPINAL FUSION  2008   Family History  Problem Relation Age of Onset  . Diabetes Maternal  Grandmother   . Hypertension Maternal Grandmother   . Heart disease Maternal Grandmother   . Heart attack Maternal Grandmother   . Osteoporosis Maternal Grandmother   . Diabetes Maternal Grandfather   . Hypertension Maternal Grandfather   . Heart disease Maternal Grandfather   . Heart attack Maternal Grandfather   . Hypertension Mother   . Anxiety disorder Mother    Social History  Substance Use Topics  . Smoking status: Never Smoker  . Smokeless tobacco: Never Used  . Alcohol use 1.2 oz/week    2 Glasses of wine per week   Review of Systems:   Review of Systems  All other systems reviewed and are negative.  Objective:   BP 120/74   Pulse 74   Temp 98.1 F (36.7 C) (Oral)   Ht 5' 6.25" (1.683 m)   Wt 143 lb 9.6 oz (65.1 kg)   LMP 07/04/2016   BMI 23.00 kg/m   General Appearance:    Alert, cooperative, no distress, appears stated age  Head:    Normocephalic, without obvious abnormality, atraumatic  Eyes:    PERRL, conjunctiva/corneas clear, EOM's intact, fundi benign, both eyes  Ears:    Normal TM's and external ear canals, both ears  Nose:   Nares normal, septum midline, mucosa normal, no drainage or sinus tenderness  Throat:   Lips, mucosa, and tongue normal; teeth and gums normal  Neck:   Supple, symmetrical, trachea midline, no adenopathy; thyroid: no enlargement/tenderness/nodules  Back:     Symmetric, no curvature, ROM normal, no CVA tenderness  Lungs:     Clear to auscultation bilaterally, respirations unlabored  Chest Wall:    No tenderness or deformity  Heart:    Regular rate and rhythm, S1 and S2 normal, no murmur, rub or gallop  Breast Exam:    No tenderness, masses, or nipple abnormality  Abdomen:     Soft, non-tender, bowel sounds active all four quadrants, no masses, no organomegaly  Genitalia:    Normal female without lesion, discharge or tenderness  Rectal:    Normal tone, no masses or tenderness; guaiac negative stool  Extremities:   Extremities  normal, atraumatic, no cyanosis or edema  Pulses:   2+ and symmetric all extremities  Skin:   Skin color, texture, turgor normal, no rashes or lesions  Lymph nodes:   Cervical, supraclavicular, and axillary nodes normal  Neurologic:   CNII-XII intact, normal strength, sensation and reflexes throughout   Results for orders placed or performed in visit on 07/25/16  CBC with Differential/Platelet  Result Value Ref Range   WBC 7.3 4.0 - 10.5 K/uL   RBC 4.26 3.87 - 5.11 Mil/uL   Hemoglobin 13.2 12.0 - 15.0 g/dL   HCT 38.5 36.0 - 46.0 %   MCV 90.4 78.0 - 100.0 fl   MCHC 34.2 30.0 - 36.0 g/dL   RDW 12.5 11.5 - 15.5 %   Platelets 268.0 150.0 - 400.0 K/uL   Neutrophils Relative % 67.4 43.0 - 77.0 %   Lymphocytes Relative 23.7 12.0 - 46.0 %   Monocytes Relative 6.5 3.0 - 12.0 %   Eosinophils Relative 1.5 0.0 - 5.0 %   Basophils Relative 0.9 0.0 - 3.0 %   Neutro Abs 4.9 1.4 - 7.7 K/uL   Lymphs Abs 1.7 0.7 - 4.0 K/uL   Monocytes Absolute 0.5 0.1 - 1.0 K/uL   Eosinophils Absolute 0.1 0.0 - 0.7 K/uL   Basophils Absolute 0.1 0.0 - 0.1 K/uL  Comprehensive metabolic panel  Result Value Ref Range   Sodium 136 135 - 145 mEq/L   Potassium 4.0 3.5 - 5.1 mEq/L   Chloride 102 96 - 112 mEq/L   CO2 25 19 - 32 mEq/L   Glucose, Bld 88 70 - 99 mg/dL   BUN 12 6 - 23 mg/dL   Creatinine, Ser 0.75 0.40 - 1.20 mg/dL   Total Bilirubin 0.6 0.2 - 1.2 mg/dL   Alkaline Phosphatase 35 (L) 39 - 117 U/L   AST 14 0 - 37 U/L   ALT 13 0 - 35 U/L   Total Protein 7.3 6.0 - 8.3 g/dL   Albumin 4.3 3.5 - 5.2 g/dL   Calcium 9.3 8.4 - 10.5 mg/dL   GFR 91.15 >60.00 mL/min  Lipid panel  Result Value Ref Range   Cholesterol 170 0 - 200 mg/dL   Triglycerides 112.0 0.0 - 149.0 mg/dL   HDL 60.80 >39.00 mg/dL   VLDL 22.4 0.0 - 40.0 mg/dL   LDL Cholesterol 86 0 - 99 mg/dL   Total CHOL/HDL Ratio 3    NonHDL 108.87   TSH  Result Value Ref Range   TSH 1.25 0.35 - 4.50 uIU/mL  Vitamin B12  Result Value Ref Range    Vitamin B-12 789 211 - 911 pg/mL  VITAMIN D 25 Hydroxy (Vit-D Deficiency, Fractures)  Result Value Ref Range   VITD 40.12 30.00 - 100.00 ng/mL  H. pylori antibody, IgG  Result Value Ref Range   H Pylori IgG Negative Negative   Assessment/Plan:   Alison Fleming was seen today for annual exam.  Diagnoses and all orders for this visit:  Routine physical examination  Laboratory tests ordered as part of  a complete physical exam (CPE) -     CBC with Differential/Platelet -     Comprehensive metabolic panel -     Lipid panel -     Vitamin B12  Hair loss Comments: Labs WNL. Maximize nutrition. Possibly hormonal. Okay referral to specialist if patient requests. I recommend [facility].  Orders: -     TSH -     Vitamin B12  Epigastric pain Comments: Consistent with gastritis. Treatment below. She will call within the next two weeks if no improvement.  Orders: -     H. pylori antibody, IgG -     ranitidine (ZANTAC) 150 MG tablet; Take 1 tablet (150 mg total) by mouth 2 (two) times daily.  Fatigue, unspecified type Comments: No red flags. Labs  Reassuring.  Orders: -     CBC with Differential/Platelet -     Comprehensive metabolic panel -     TSH -     Vitamin B12  Low vitamin D level -     VITAMIN D 25 Hydroxy (Vit-D Deficiency, Fractures)   Patient Counseling:   [x]     Nutrition: Stressed importance of moderation in sodium/caffeine intake, saturated fat and cholesterol, caloric balance, sufficient intake of fresh fruits, vegetables, fiber, calcium, iron, and 1 mg of folate supplement per day (for females capable of pregnancy).   [x]      Stressed the importance of regular exercise.    [x]     Substance Abuse: Discussed cessation/primary prevention of tobacco, alcohol, or other drug use; driving or other dangerous activities under the influence; availability of treatment for abuse.    [x]      Injury prevention: Discussed safety belts, safety helmets,  smoke detector, smoking near bedding or upholstery.    [x]      Sexuality: Discussed sexually transmitted diseases, partner selection, use of condoms, avoidance of unintended pregnancy  and contraceptive alternatives.    [x]     Dental health: Discussed importance of regular tooth brushing, flossing, and dental visits.   [x]      Health maintenance and immunizations reviewed. Please refer to Health maintenance section.   [name], DO [place] served as Education administrator during this visit. History, Physical, and Plan performed by medical provider. The above documentation has been reviewed and is accurate and complete. [name], D.O.

## 2016-07-29 ENCOUNTER — Encounter: Payer: Self-pay | Admitting: Family Medicine

## 2016-08-14 ENCOUNTER — Encounter: Payer: BLUE CROSS/BLUE SHIELD | Admitting: Family Medicine

## 2016-08-15 ENCOUNTER — Telehealth: Payer: Self-pay | Admitting: Family Medicine

## 2016-08-15 NOTE — Telephone Encounter (Signed)
Patient Name: Alison Fleming  DOB: [birth year]    Initial Comment Caller states was hit in side of head last night w/basketball; had nausea and was tired; slept well; having dull pain and tiredness;    Nurse Assessment  Nurse: Julien Girt, RN, Almyra Free Date/Time Eilene Ghazi Time): 08/15/2016 10:41:31 AM  Confirm and document reason for call. If symptomatic, describe symptoms. ---Caller states was hit in the right side of her head last night w/basketball. She had nausea and felt tired. She slept well but woke up with nausea, has a mild headache and feels "tired".  Does the patient have any new or worsening symptoms? ---Yes  Will a triage be completed? ---Yes  Related visit to physician within the last 2 weeks? ---No  Does the PT have any chronic conditions? (i.e. diabetes, asthma, etc.) ---Yes  List chronic conditions. ---GERD, Multi Vits  Is the patient pregnant or possibly pregnant? (Ask all females between the ages of 96-55) ---No  Is this a behavioral health or substance abuse call? ---No     Guidelines    Guideline Title Affirmed Question Affirmed Notes  Head Injury Scalp swelling, bruise or pain    Final Disposition User   [place], RN, Almyra Free    Disagree/Comply: Comply

## 2016-08-15 NOTE — Telephone Encounter (Signed)
Spoke with patient. Since speaking with the Triage nurse her nausea has subsided and headache has improved. She said the triage nurse was very helpful with things to do at home like have her husband wake her up several times during the night over the next few days. She will be following those guidelines. Her fatigue has improved as well and not feel as tired. I did advice her that she would need to follow up if the headache does not completely resolve. Also to just make sure she watches for signs of cognitive changes, dizziness, nausea, vomiting, increased tiredness. Pt was agreeable to the above information and will call us back if she does not feel 100 percent better in the next few days.

## 2016-10-12 ENCOUNTER — Ambulatory Visit (INDEPENDENT_AMBULATORY_CARE_PROVIDER_SITE_OTHER): Payer: BLUE CROSS/BLUE SHIELD | Admitting: Family Medicine

## 2016-10-12 ENCOUNTER — Encounter: Payer: Self-pay | Admitting: Family Medicine

## 2016-10-12 VITALS — BP 114/68 | HR 78 | Temp 98.5°F | Ht 66.25 in | Wt 146.4 lb

## 2016-10-12 DIAGNOSIS — L989 Disorder of the skin and subcutaneous tissue, unspecified: Secondary | ICD-10-CM | POA: Diagnosis not present

## 2016-10-12 NOTE — Progress Notes (Signed)
   Alison Fleming is a 41 y.o. female here for an acute visit.  History of Present Illness:   [name] CMA acting as scribe for Dr. Juleen China.  HPI: Patient comes in today for an acute visit. She stated that she has a small mass under her right arm pit. She noticed about 4 weeks ago while shaving. She denies any drainage or redness around the area.   PMHx, SurgHx, SocialHx, Medications, and Allergies were reviewed in the Visit Navigator and updated as appropriate.  Current Medications:   .  acyclovir (ZOVIRAX) 400 MG tablet, Take 1 tablet (400 mg total) by mouth 3 (three) times daily. Take for 5 days as needed., Disp: 30 tablet .  Cranberry 600 MG TABS, Take 3,600 mg by mouth daily., Disp: , Rfl:  .  Norethindrone Acetate-Ethinyl Estradiol (MICROGESTIN) 1.5-30 MG-MCG tablet, Take 1 tablet by mouth daily., Disp: 3 Package, Rfl: 4 .  Prenatal Vit-Fe Fumarate-FA (MULTIVITAMIN-PRENATAL) 27-0.8 MG TABS, Take 1 tablet by mouth daily., Disp: , Rfl:  .  ranitidine (ZANTAC) 150 MG tablet, Take 1 tablet (150 mg total) by mouth 2 (two) times daily., Disp: 60 tablet, Rfl: 2   No Known Allergies   Review of Systems:   Pertinent items are noted in the HPI. Otherwise, ROS is negative.  Vitals:   Vitals:   10/12/16 1104  BP: 114/68  Pulse: 78  Temp: 98.5 F (36.9 C)  TempSrc: Oral  SpO2: 98%  Weight: 146 lb 6.4 oz (66.4 kg)  Height: 5' 6.25" (1.683 m)     Body mass index is 23.45 kg/m.   Physical Exam:   Physical Exam  Constitutional: She appears well-nourished.  HENT:  Head: Normocephalic and atraumatic.  Eyes: Pupils are equal, round, and reactive to light. EOM are normal.  Neck: Normal range of motion. Neck supple.  Cardiovascular: Normal rate, regular rhythm, normal heart sounds and intact distal pulses.   Pulmonary/Chest: Effort normal.  Abdominal: Soft.  Skin: Skin is warm.  Right axilla with no palpated abnormality. One tiny cyst versus lipoma palpated. No skin changes.    Psychiatric: She has a normal mood and affect. Her behavior is normal.  Nursing note and vitals reviewed.   Assessment and Plan:   Diagnoses and all orders for this visit:  Benign skin lesion Comments: Reviewed red flags.   . Reviewed expectations re: course of current medical issues. . Discussed self-management of symptoms. . Outlined signs and symptoms indicating need for more acute intervention. . Patient verbalized understanding and all questions were answered. [name] Health Maintenance issues including appropriate healthy diet, exercise, and smoking avoidance were discussed with patient. . See orders for this visit as documented in the electronic medical record. . Patient received an After Visit Summary.  CMA served as Education administrator during this visit. History, Physical, and Plan performed by medical provider. The above documentation has been reviewed and is accurate and complete. [name], D.O.  [name], DO [place], [place] 10/14/2016  Future Appointments Date Time Provider [place]  02/23/2017 1:00 PM Regina Eck, CNM [place] None

## 2016-10-22 ENCOUNTER — Other Ambulatory Visit: Payer: Self-pay | Admitting: Family Medicine

## 2016-10-22 DIAGNOSIS — R1013 Epigastric pain: Secondary | ICD-10-CM

## 2016-11-03 ENCOUNTER — Other Ambulatory Visit: Payer: Self-pay | Admitting: Obstetrics and Gynecology

## 2016-11-03 DIAGNOSIS — Z1231 Encounter for screening mammogram for malignant neoplasm of breast: Secondary | ICD-10-CM

## 2016-11-09 ENCOUNTER — Other Ambulatory Visit: Payer: Self-pay | Admitting: *Deleted

## 2016-11-09 MED ORDER — NORETHINDRONE ACET-ETHINYL EST 1.5-30 MG-MCG PO TABS: 1.0000 | ORAL_TABLET | Freq: Every day | ORAL | 1 refills | Status: DC

## 2016-11-09 NOTE — Telephone Encounter (Signed)
Medication refill request: OCP 90 day supply Last AEX:  02-15-16  Next AEX: 02-23-17  Last MMG (if hormonal medication request): 11-12-15 WNL  (Scheduled 12-21-16)  Refill authorized: please advise

## 2016-11-10 ENCOUNTER — Other Ambulatory Visit: Payer: Self-pay

## 2016-11-10 DIAGNOSIS — R1013 Epigastric pain: Secondary | ICD-10-CM

## 2016-11-10 MED ORDER — RANITIDINE HCL 150 MG PO TABS: 150.0000 mg | ORAL_TABLET | Freq: Two times a day (BID) | ORAL | 1 refills | Status: DC

## 2016-12-21 ENCOUNTER — Ambulatory Visit: Payer: BLUE CROSS/BLUE SHIELD

## 2016-12-22 ENCOUNTER — Ambulatory Visit
Admission: RE | Admit: 2016-12-22 | Discharge: 2016-12-22 | Disposition: A | Discharge status: Home / Self Care | Payer: BLUE CROSS/BLUE SHIELD | Source: Ambulatory Visit | Attending: Obstetrics and Gynecology | Admitting: Obstetrics and Gynecology

## 2016-12-22 DIAGNOSIS — Z1231 Encounter for screening mammogram for malignant neoplasm of breast: Secondary | ICD-10-CM | POA: Diagnosis not present

## 2016-12-27 ENCOUNTER — Ambulatory Visit (INDEPENDENT_AMBULATORY_CARE_PROVIDER_SITE_OTHER): Payer: BLUE CROSS/BLUE SHIELD | Admitting: Family Medicine

## 2016-12-27 ENCOUNTER — Encounter: Payer: Self-pay | Admitting: Family Medicine

## 2016-12-27 VITALS — BP 118/64 | HR 90 | Temp 98.6°F | Wt 147.2 lb

## 2016-12-27 DIAGNOSIS — R5383 Other fatigue: Secondary | ICD-10-CM | POA: Diagnosis not present

## 2016-12-27 DIAGNOSIS — R1013 Epigastric pain: Secondary | ICD-10-CM | POA: Diagnosis not present

## 2016-12-27 MED ORDER — OMEPRAZOLE 40 MG PO CPDR: 40.0000 mg | DELAYED_RELEASE_CAPSULE | Freq: Every day | ORAL | 3 refills | Status: DC

## 2016-12-28 ENCOUNTER — Encounter: Payer: Self-pay | Admitting: Gastroenterology

## 2016-12-28 LAB — CBC WITH DIFFERENTIAL/PLATELET
Basophils Absolute: 0.1 10*3/uL (ref 0.0–0.1)
Basophils Relative: 1.3 % (ref 0.0–3.0)
Eosinophils Absolute: 0.1 10*3/uL (ref 0.0–0.7)
Eosinophils Relative: 2 % (ref 0.0–5.0)
HCT: 39.6 % (ref 36.0–46.0)
Hemoglobin: 13.4 g/dL (ref 12.0–15.0)
Lymphocytes Relative: 21.2 % (ref 12.0–46.0)
Lymphs Abs: 1.5 10*3/uL (ref 0.7–4.0)
MCHC: 33.7 g/dL (ref 30.0–36.0)
MCV: 92.2 fl (ref 78.0–100.0)
Monocytes Absolute: 0.5 10*3/uL (ref 0.1–1.0)
Monocytes Relative: 7.3 % (ref 3.0–12.0)
Neutro Abs: 5 10*3/uL (ref 1.4–7.7)
Neutrophils Relative %: 68.2 % (ref 43.0–77.0)
Platelets: 301 10*3/uL (ref 150.0–400.0)
RBC: 4.3 Mil/uL (ref 3.87–5.11)
RDW: 12.8 % (ref 11.5–15.5)
WBC: 7.3 10*3/uL (ref 4.0–10.5)

## 2016-12-28 LAB — COMPREHENSIVE METABOLIC PANEL
ALT: 17 U/L (ref 0–35)
AST: 17 U/L (ref 0–37)
Albumin: 4.6 g/dL (ref 3.5–5.2)
Alkaline Phosphatase: 32 U/L — ABNORMAL LOW (ref 39–117)
BUN: 10 mg/dL (ref 6–23)
CO2: 28 mEq/L (ref 19–32)
Calcium: 9.3 mg/dL (ref 8.4–10.5)
Chloride: 103 mEq/L (ref 96–112)
Creatinine, Ser: 0.78 mg/dL (ref 0.40–1.20)
GFR: 86.93 mL/min (ref >60.00)
Glucose, Bld: 107 mg/dL — ABNORMAL HIGH (ref 70–99)
Potassium: 3.9 mEq/L (ref 3.5–5.1)
Sodium: 138 mEq/L (ref 135–145)
Total Bilirubin: 0.5 mg/dL (ref 0.2–1.2)
Total Protein: 7.5 g/dL (ref 6.0–8.3)

## 2016-12-28 LAB — LIPASE: Lipase: 32 U/L (ref 11.0–59.0)

## 2016-12-29 ENCOUNTER — Telehealth: Payer: Self-pay | Admitting: Family Medicine

## 2016-12-29 LAB — B. BURGDORFI ANTIBODIES BY WB
B burgdorferi IgG Abs (IB): NEGATIVE
B burgdorferi IgM Abs (IB): POSITIVE — AB
Lyme Disease 18 kD IgG: NONREACTIVE
Lyme Disease 23 kD IgG: NONREACTIVE
Lyme Disease 23 kD IgM: REACTIVE — AB
Lyme Disease 28 kD IgG: NONREACTIVE
Lyme Disease 30 kD IgG: NONREACTIVE
Lyme Disease 39 kD IgG: NONREACTIVE
Lyme Disease 39 kD IgM: NONREACTIVE
Lyme Disease 41 kD IgG: NONREACTIVE
Lyme Disease 41 kD IgM: REACTIVE — AB
Lyme Disease 45 kD IgG: NONREACTIVE
Lyme Disease 58 kD IgG: NONREACTIVE
Lyme Disease 66 kD IgG: NONREACTIVE
Lyme Disease 93 kD IgG: NONREACTIVE

## 2016-12-29 LAB — ROCKY MTN SPOTTED FVR ABS PNL(IGG+IGM)
RMSF IgG: NOT DETECTED
RMSF IgM: NOT DETECTED

## 2016-12-29 LAB — CMV IGM: CMV IgM: <30 AU/mL

## 2016-12-29 NOTE — Telephone Encounter (Signed)
Called patient and let her know that all results are not back once received we will give her a call. She had no other questions at this time.

## 2016-12-29 NOTE — Telephone Encounter (Signed)
Copied from [place]. Topic: Quick Communication - See Telephone Encounter >> Dec 29, 2016 11:12 AM Boyd Kerbs wrote: CRM for notification. See Telephone encounter for:  Patient calling regarding lab results 12/29/16.

## 2016-12-29 NOTE — Telephone Encounter (Signed)
Patient calling to inquire about lab results. Please call patient to advise.

## 2016-12-30 MED ORDER — DOXYCYCLINE HYCLATE 100 MG PO TABS: 100.0000 mg | ORAL_TABLET | Freq: Two times a day (BID) | ORAL | 0 refills | Status: AC

## 2016-12-31 ENCOUNTER — Encounter: Payer: Self-pay | Admitting: Family Medicine

## 2016-12-31 NOTE — Progress Notes (Signed)
Alison Fleming is a 41 y.o. female is here for follow up.  History of Present Illness:   HPI: See Assessment and Plan section for Problem Based Charting of issues discussed today.  There are no preventive care reminders to display for this patient.   Depression screen PHQ 2/9 10/12/2016  Decreased Interest 0  Down, Depressed, Hopeless 0  PHQ - 2 Score 0   PMHx, SurgHx, SocialHx, FamHx, Medications, and Allergies were reviewed in the Visit Navigator and updated as appropriate.   Patient Active Problem List   Diagnosis Date Noted  . GAD (generalized anxiety disorder) 05/10/2016   Social History   Tobacco Use  . Smoking status: Never Smoker  . Smokeless tobacco: Never Used  Substance Use Topics  . Alcohol use: Yes    Alcohol/week: 1.2 oz    Types: 2 Glasses of wine per week  . Drug use: No   Current Medications and Allergies:   .  acyclovir (ZOVIRAX) 400 MG tablet, Take 1 tablet (400 mg total) by mouth 3 (three) times daily. Take for 5 days as needed., Disp: 30 tablet, Rfl: 12 .  cholecalciferol (VITAMIN D) 1000 units tablet, Take 1,000 Units by mouth daily., Disp: , Rfl:  .  Cranberry 600 MG TABS, Take 3,600 mg by mouth daily., Disp: , Rfl:  .  cyanocobalamin 100 MCG tablet, Take 100 mcg by mouth daily., Disp: , Rfl:  .  Norethindrone Acetate-Ethinyl Estradiol (MICROGESTIN) 1.5-30 MG-MCG tablet, Take 1 tablet by mouth daily., Disp: 3 Package, Rfl: 1 .  Prenatal Vit-Fe Fumarate-FA (MULTIVITAMIN-PRENATAL) 27-0.8 MG TABS, Take 1 tablet by mouth daily., Disp: , Rfl:  .  ranitidine (ZANTAC) 150 MG tablet, Take 1 tablet (150 mg total) by mouth 2 (two) times daily., Disp: 180 tablet, Rfl: 1 .  doxycycline (VIBRA-TABS) 100 MG tablet, Take 1 tablet (100 mg total) by mouth 2 (two) times daily for 14 days., Disp: 28 tablet, Rfl: 0 .  omeprazole (PRILOSEC) 40 MG capsule, Take 1 capsule (40 mg total) by mouth daily., Disp: 30 capsule, Rfl: 3  No Known Allergies   Review of Systems    Pertinent items are noted in the HPI. Otherwise, ROS is negative.  Vitals:   Vitals:   12/27/16 1418  BP: 118/64  Pulse: 90  Temp: 98.6 F (37 C)  TempSrc: Oral  SpO2: 97%  Weight: 147 lb 3.2 oz (66.8 kg)     Body mass index is 23.58 kg/m.   Physical Exam:   Physical Exam  Constitutional: She appears well-nourished.  HENT:  Head: Normocephalic and atraumatic.  Eyes: EOM are normal. Pupils are equal, round, and reactive to light.  Neck: Normal range of motion. Neck supple.  Cardiovascular: Normal rate, regular rhythm, normal heart sounds and intact distal pulses.  Pulmonary/Chest: Effort normal.  Abdominal: Soft. There is no hepatosplenomegaly. There is tenderness in the epigastric area. No hernia.  Skin: Skin is warm.  Psychiatric: She has a normal mood and affect. Her behavior is normal.  Nursing note and vitals reviewed.    Results for orders placed or performed in visit on 12/27/16  CMV IgM  Result Value Ref Range   CMV IgM <30.00 AU/mL  CBC with Differential/Platelet  Result Value Ref Range   WBC 7.3 4.0 - 10.5 K/uL   RBC 4.30 3.87 - 5.11 Mil/uL   Hemoglobin 13.4 12.0 - 15.0 g/dL   HCT 39.6 36.0 - 46.0 %   MCV 92.2 78.0 - 100.0 fl   MCHC 33.7  30.0 - 36.0 g/dL   RDW 12.8 11.5 - 15.5 %   Platelets 301.0 150.0 - 400.0 K/uL   Neutrophils Relative % 68.2 43.0 - 77.0 %   Lymphocytes Relative 21.2 12.0 - 46.0 %   Monocytes Relative 7.3 3.0 - 12.0 %   Eosinophils Relative 2.0 0.0 - 5.0 %   Basophils Relative 1.3 0.0 - 3.0 %   Neutro Abs 5.0 1.4 - 7.7 K/uL   Lymphs Abs 1.5 0.7 - 4.0 K/uL   Monocytes Absolute 0.5 0.1 - 1.0 K/uL   Eosinophils Absolute 0.1 0.0 - 0.7 K/uL   Basophils Absolute 0.1 0.0 - 0.1 K/uL  Comprehensive metabolic panel  Result Value Ref Range   Sodium 138 135 - 145 mEq/L   Potassium 3.9 3.5 - 5.1 mEq/L   Chloride 103 96 - 112 mEq/L   CO2 28 19 - 32 mEq/L   Glucose, Bld 107 (H) 70 - 99 mg/dL   BUN 10 6 - 23 mg/dL   Creatinine, Ser 0.78  0.40 - 1.20 mg/dL   Total Bilirubin 0.5 0.2 - 1.2 mg/dL   Alkaline Phosphatase 32 (L) 39 - 117 U/L   AST 17 0 - 37 U/L   ALT 17 0 - 35 U/L   Total Protein 7.5 6.0 - 8.3 g/dL   Albumin 4.6 3.5 - 5.2 g/dL   Calcium 9.3 8.4 - 10.5 mg/dL   GFR 86.93 >60.00 mL/min  Lipase  Result Value Ref Range   Lipase 32.0 11.0 - 59.0 U/L  Rocky mtn spotted fvr abs pnl(IgG+IgM)  Result Value Ref Range   RMSF IgG NOT DETECTED NOT DETECT   RMSF IgM NOT DETECTED NOT DETECT  B. burgdorfi antibodies by WB  Result Value Ref Range   B burgdorferi IgG Abs (IB) NEGATIVE NEGATIVE   Lyme Disease 18 kD IgG NON-REACTIVE    Lyme Disease 23 kD IgG NON-REACTIVE    Lyme Disease 28 kD IgG NON-REACTIVE    Lyme Disease 30 kD IgG NON-REACTIVE    Lyme Disease 39 kD IgG NON-REACTIVE    Lyme Disease 41 kD IgG NON-REACTIVE    Lyme Disease 45 kD IgG NON-REACTIVE    Lyme Disease 58 kD IgG NON-REACTIVE    Lyme Disease 66 kD IgG NON-REACTIVE    Lyme Disease 93 kD IgG NON-REACTIVE    B burgdorferi IgM Abs (IB) POSITIVE (A) NEGATIVE   Lyme Disease 23 kD IgM REACTIVE (A)    Lyme Disease 39 kD IgM NON-REACTIVE    Lyme Disease 41 kD IgM REACTIVE (A)    Assessment and Plan:   Alison Fleming was seen today for fatigue and abdominal pain.  Diagnoses and all orders for this visit:  Epigastric pain Comments: We reviewed the diagnosis of GERD and the reasons why it was important to treat this. We discussed "red flag" symptoms and the importance of follow up if symptoms persisted despite treatment. We reviewed non-pharmacologic management of GERD symptoms: including: caffeine reduction, dietary changes, elevate HOB, NPO after supper, reduction of alcohol intake, tobacco cessation, and weight loss. Orders: -     CMV IgM -     omeprazole (PRILOSEC) 40 MG capsule; Take 1 capsule (40 mg total) by mouth daily. -     Comprehensive metabolic panel -     Lipase -     Cancel: Lyme Ab/Western Blot Reflex -     Ambulatory referral to  Gastroenterology  Fatigue, unspecified type Comments: Patient complains of fatigue. Symptoms began several weeks ago. Symptoms  of her fatigue have been anxiousness, diffuse soft tissue aches and pains, general malaise and hypersomnolence. Patient describes the following psychological symptoms: stress at work and stress in the family. Patient denies constipation, fever, GI blood loss, significant change in weight and unusual rashes. Symptoms have progressed to a point and plateaued. Symptom severity: struggles to carry out day to day responsibilities..   Fatigue x 1 month. Generalized. Labs reassuring so far. Lyme testing likely false positive but okay trial of Doxy. Red flags reviewed. The patient is asked to make an attempt to improve diet and exercise patterns to aid in medical management of this problem.  Orders: -     CMV IgM -     CBC with Differential/Platelet -     Comprehensive metabolic panel -     Lipase -     Rocky mtn spotted fvr abs pnl(IgG+IgM) -     Cancel: Lyme Ab/Western Blot Reflex -     B. burgdorfi antibodies by WB -     doxycycline (VIBRA-TABS) 100 MG tablet; Take 1 tablet (100 mg total) by mouth 2 (two) times daily for 14 days.    . Reviewed expectations re: course of current medical issues. . Discussed self-management of symptoms. . Outlined signs and symptoms indicating need for more acute intervention. . Patient verbalized understanding and all questions were answered. [name] Health Maintenance issues including appropriate healthy diet, exercise, and smoking avoidance were discussed with patient. . See orders for this visit as documented in the electronic medical record. . Patient received an After Visit Summary.  [name], DO [place] [place] 12/31/2016  Future Appointments  Date Time Provider [place]  01/01/2017 10:45 AM Mauri Pole, MD [facility]  02/23/2017  1:00 PM Regina Eck, CNM [place] None

## 2017-01-01 ENCOUNTER — Ambulatory Visit (INDEPENDENT_AMBULATORY_CARE_PROVIDER_SITE_OTHER): Payer: BLUE CROSS/BLUE SHIELD | Admitting: Gastroenterology

## 2017-01-01 ENCOUNTER — Encounter: Payer: Self-pay | Admitting: Gastroenterology

## 2017-01-01 VITALS — BP 96/70 | HR 80 | Ht 66.0 in | Wt 149.5 lb

## 2017-01-01 DIAGNOSIS — R1013 Epigastric pain: Secondary | ICD-10-CM | POA: Diagnosis not present

## 2017-01-01 DIAGNOSIS — A6920 Lyme disease, unspecified: Secondary | ICD-10-CM | POA: Diagnosis not present

## 2017-01-01 NOTE — Progress Notes (Signed)
Alison Fleming    [id number]    March 04, 1975  Primary Care Physician:Wallace, Danae Chen, DO  Referring Physician: [name], [place], [place] [address]  Chief complaint:  GERD HPI: 41 year old female here for new patient visit with complaints of epigastric and left upper quadrant abdominal pain, intermittent for the past few months but feels it is getting worse in the past few weeks.  She was started on omeprazole 40 mg daily by Dr. Juleen China and noticed some improvement .  Denies any dysphagia, vomiting, change in bowel habits, melena or blood per rectum .  She recently tested positive for Lyme disease, was prescribed antibiotics.  She has not started taking them yet as she is worried about potential side effects and if she will be able to tolerate them with her upper GI symptoms. Weight is stable and denies any unintentional weight loss recently No family history of GI cancer or IBD H.Pylori IgG antibody negative December 27, 2016   Outpatient Encounter Medications as of 01/01/2017  Medication Sig  . acyclovir (ZOVIRAX) 400 MG tablet Take 1 tablet (400 mg total) by mouth 3 (three) times daily. Take for 5 days as needed.  . cholecalciferol (VITAMIN D) 1000 units tablet Take 1,000 Units by mouth daily.  . Cranberry 600 MG TABS Take 3,600 mg by mouth daily.  . cyanocobalamin 100 MCG tablet Take 100 mcg by mouth daily.  . Norethindrone Acetate-Ethinyl Estradiol (MICROGESTIN) 1.5-30 MG-MCG tablet Take 1 tablet by mouth daily.  [name] omeprazole (PRILOSEC) 40 MG capsule Take 1 capsule (40 mg total) by mouth daily.  . Prenatal Vit-Fe Fumarate-FA (MULTIVITAMIN-PRENATAL) 27-0.8 MG TABS Take 1 tablet by mouth daily.  . ranitidine (ZANTAC) 150 MG tablet Take 1 tablet (150 mg total) by mouth 2 (two) times daily.  [name] doxycycline (VIBRA-TABS) 100 MG tablet Take 1 tablet (100 mg total) by mouth 2 (two) times daily for 14 days. (Patient not taking: Reported on 01/01/2017)    No facility-administered encounter medications on file as of 01/01/2017.     Allergies as of 01/01/2017  . (No Known Allergies)    Past Medical History:  Diagnosis Date  . Anxiety   . Seasonal allergies     Past Surgical History:  Procedure Laterality Date  . DILATATION & CURRETTAGE/HYSTEROSCOPY WITH RESECTOCOPE  02/12/2012   Procedure: [place];  Surgeon: Azalia Bilis, MD;  Location: [facility];  Service: Gynecology;  Laterality: N/A;  . DILATION AND CURETTAGE OF UTERUS  2010  . DILATION AND CURETTAGE OF UTERUS  2012  . SPINAL FUSION  2008   rods on both sides of entire spinal column for scoliocis    Family History  Problem Relation Age of Onset  . Diabetes Maternal Grandmother   . Hypertension Maternal Grandmother   . Heart disease Maternal Grandmother        Heart attack  . Heart attack Maternal Grandmother   . Osteoporosis Maternal Grandmother   . Diabetes Maternal Grandfather   . Hypertension Maternal Grandfather   . Heart disease Maternal Grandfather        heart attack  . Heart attack Maternal Grandfather   . Hypertension Mother   . Anxiety disorder Mother     Social History   Socioeconomic History  . Marital status: Married    Spouse name: Not on file  . Number of children: 2  . Years of education: Not on file  . Highest  education level: Not on file  Social Needs  . Financial resource strain: Not on file  . Food insecurity - worry: Not on file  . Food insecurity - inability: Not on file  . Transportation needs - medical: Not on file  . Transportation needs - non-medical: Not on file  Occupational History    Employer: [employer]  Tobacco Use  . Smoking status: Never Smoker  . Smokeless tobacco: Never Used  Substance and Sexual Activity  . Alcohol use: Yes    Alcohol/week: 1.2 oz    Types: 2 Glasses of wine per week  . Drug use: No  . Sexual activity: Yes    Partners: Male    Birth  control/protection: OCP    Comment: [place] 1/35  Other Topics Concern  . Not on file  Social History Narrative  . Not on file      Review of systems: Review of Systems  Constitutional: Negative for fever and chills. Positive for fatigue HENT: Negative.   Eyes: Negative for blurred vision.  Respiratory: Negative for shortness of breath and wheezing.  Positive for dry cough Cardiovascular: Negative for chest pain and palpitations.  Gastrointestinal: as per HPI Genitourinary: Negative for dysuria, urgency, frequency and hematuria.  Musculoskeletal: Positive for myalgias, back pain and joint pain.  Skin: Negative for itching and rash.  Neurological: Negative for dizziness, tremors, focal weakness, seizures and loss of consciousness. Positive for headaches Endo/Heme/Allergies: Positive for seasonal allergies.  Psychiatric/Behavioral: Negative for depression, suicidal ideas and hallucinations. Positive for anxiety All other systems reviewed and are negative.   Physical Exam: Vitals:   01/01/17 1055  BP: 96/70  Pulse: 80   Body mass index is 24.13 kg/m. Gen:      No acute distress HEENT:  EOMI, sclera anicteric Neck:     No masses; no thyromegaly Lungs:    Clear to auscultation bilaterally; normal respiratory effort CV:         Regular rate and rhythm; no murmurs Abd:      + bowel sounds; soft, non-tender; no palpable masses, no distension Ext:    No edema; adequate peripheral perfusion Skin:      Warm and dry; no rash Neuro: alert and oriented x 3 Psych: normal mood and affect  Data Reviewed:  Reviewed labs, radiology imaging, old records and pertinent past GI work up   Assessment and Plan/Recommendations:  41 year old female with history of anxiety disorder here with complaints of epigastric and upper abdominal pain H. pylori IgG antibody negative Reviewed labs done by Dr. Juleen China on December 27, 2016, normal LFT and lipase level.  Continue omeprazole 40 mg daily for  2 months Ranitidine 150 mg at bedtime as needed We will schedule for EGD to evaluate and exclude severe gastritis or peptic ulcer disease Discussed antireflux measures and lifestyle modification in detail Obtain abdominal ultrasound to exclude gallbladder disease, gallstones. Return in 3 months or sooner if needed  K. Denzil Magnuson , MD [phone] Mon-Fri 8a-5p [phone] after 5p, weekends, holidays  CC: [name], DO

## 2017-01-01 NOTE — Patient Instructions (Signed)
You have been scheduled for an abdominal ultrasound at [facility] (1st floor of hospital) on 01/03/2017 at 7am. Please arrive 15 minutes prior to your appointment for registration. Make certain not to have anything to eat or drink fter midnight hours prior to your appointment. Should you need to reschedule your appointment, please contact radiology at [phone]. This test typically takes about 30 minutes to perform.  You have been scheduled for an endoscopy. Please follow written instructions given to you at your visit today. If you use inhalers (even only as needed), please bring them with you on the day of your procedure.   Continue omeprazole 40 mg daily before breakfast   Take Zantac 150 mg at bedtime as needed     Gastroesophageal Reflux Disease, Adult Normally, food travels down the esophagus and stays in the stomach to be digested. However, when a person has gastroesophageal reflux disease (GERD), food and stomach acid move back up into the esophagus. When this happens, the esophagus becomes sore and inflamed. Over time, GERD can create small holes (ulcers) in the lining of the esophagus. What are the causes? This condition is caused by a problem with the muscle between the esophagus and the stomach (lower esophageal sphincter, or LES). Normally, the LES muscle closes after food passes through the esophagus to the stomach. When the LES is weakened or abnormal, it does not close properly, and that allows food and stomach acid to go back up into the esophagus. The LES can be weakened by certain dietary substances, medicines, and medical conditions, including:  Tobacco use.  Pregnancy.  Having a hiatal hernia.  Heavy alcohol use.  Certain foods and beverages, such as coffee, chocolate, onions, and peppermint.  What increases the risk? This condition is more likely to develop in:  People who have an increased body weight.  People who have connective tissue  disorders.  People who use NSAID medicines.  What are the signs or symptoms? Symptoms of this condition include:  Heartburn.  Difficult or painful swallowing.  The feeling of having a lump in the throat.  Abitter taste in the mouth.  Bad breath.  Having a large amount of saliva.  Having an upset or bloated stomach.  Belching.  Chest pain.  Shortness of breath or wheezing.  Ongoing (chronic) cough or a night-time cough.  Wearing away of tooth enamel.  Weight loss.  Different conditions can cause chest pain. Make sure to see your health care provider if you experience chest pain. How is this diagnosed? Your health care provider will take a medical history and perform a physical exam. To determine if you have mild or severe GERD, your health care provider may also monitor how you respond to treatment. You may also have other tests, including:  An endoscopy toexamine your stomach and esophagus with a small camera.  A test thatmeasures the acidity level in your esophagus.  A test thatmeasures how much pressure is on your esophagus.  A barium swallow or modified barium swallow to show the shape, size, and functioning of your esophagus.  How is this treated? The goal of treatment is to help relieve your symptoms and to prevent complications. Treatment for this condition may vary depending on how severe your symptoms are. Your health care provider may recommend:  Changes to your diet.  Medicine.  Surgery.  Follow these instructions at home: Diet  Follow a diet as recommended by your health care provider. This may involve avoiding foods and drinks such as: ?  Coffee and tea (with or without caffeine). ? Drinks that containalcohol. ? Energy drinks and sports drinks. ? Carbonated drinks or sodas. ? Chocolate and cocoa. ? Peppermint and mint flavorings. ? Garlic and onions. ? Horseradish. ? Spicy and acidic foods, including peppers, chili powder, curry  powder, vinegar, hot sauces, and barbecue sauce. ? Citrus fruit juices and citrus fruits, such as oranges, lemons, and limes. ? Tomato-based foods, such as red sauce, chili, salsa, and pizza with red sauce. ? Fried and fatty foods, such as donuts, french fries, potato chips, and high-fat dressings. ? High-fat meats, such as hot dogs and fatty cuts of red and white meats, such as rib eye steak, sausage, ham, and bacon. ? High-fat dairy items, such as whole milk, butter, and cream cheese.  Eat small, frequent meals instead of large meals.  Avoid drinking large amounts of liquid with your meals.  Avoid eating meals during the 2-3 hours before bedtime.  Avoid lying down right after you eat.  Do not exercise right after you eat. General instructions  Pay attention to any changes in your symptoms.  Take over-the-counter and prescription medicines only as told by your health care provider. Do not take aspirin, ibuprofen, or other NSAIDs unless your health care provider told you to do so.  Do not use any tobacco products, including cigarettes, chewing tobacco, and e-cigarettes. If you need help quitting, ask your health care provider.  Wear loose-fitting clothing. Do not wear anything tight around your waist that causes pressure on your abdomen.  Raise (elevate) the head of your bed 6 inches (15cm).  Try to reduce your stress, such as with yoga or meditation. If you need help reducing stress, ask your health care provider.  If you are overweight, reduce your weight to an amount that is healthy for you. Ask your health care provider for guidance about a safe weight loss goal.  Keep all follow-up visits as told by your health care provider. This is important. Contact a health care provider if:  You have new symptoms.  You have unexplained weight loss.  You have difficulty swallowing, or it hurts to swallow.  You have wheezing or a persistent cough.  Your symptoms do not improve with  treatment.  You have a hoarse voice. Get help right away if:  You have pain in your arms, neck, jaw, teeth, or back.  You feel sweaty, dizzy, or light-headed.  You have chest pain or shortness of breath.  You vomit and your vomit looks like blood or coffee grounds.  You faint.  Your stool is bloody or black.  You cannot swallow, drink, or eat. This information is not intended to replace advice given to you by your health care provider. Make sure you discuss any questions you have with your health care provider. Document Released: 10/12/2004 Document Revised: 06/02/2015 Document Reviewed: 04/29/2014 Elsevier Interactive Patient Education  2017 [employer].

## 2017-01-03 ENCOUNTER — Telehealth: Payer: Self-pay | Admitting: Family Medicine

## 2017-01-03 ENCOUNTER — Ambulatory Visit (HOSPITAL_COMMUNITY)
Admission: RE | Admit: 2017-01-03 | Discharge: 2017-01-03 | Disposition: A | Discharge status: Home / Self Care | Payer: BLUE CROSS/BLUE SHIELD | Source: Ambulatory Visit | Attending: Gastroenterology | Admitting: Gastroenterology

## 2017-01-03 DIAGNOSIS — A6920 Lyme disease, unspecified: Secondary | ICD-10-CM

## 2017-01-03 DIAGNOSIS — R1013 Epigastric pain: Secondary | ICD-10-CM | POA: Diagnosis not present

## 2017-01-03 NOTE — Telephone Encounter (Signed)
Called patient states she has been on antibiotic x 3 days. Still having extreme fatigue. She is also concerned that she is still having b/l thigh "ache" She states it is front and back does not go past the knee. She states that it is sometimes back of thigh and sometimes front of both legs. No redness, swelling or tenderness to the touch. She wanted to know if this could be from the lyme disease or should she be concerned.

## 2017-01-03 NOTE — Telephone Encounter (Signed)
Called patient answered all questions. She will call if any new symptoms or no improvement in a week.

## 2017-01-03 NOTE — Telephone Encounter (Signed)
If the antibiotic helps, she will not see any improvement for another week. If symptoms worsening, let's have her come in again for evaluation. I did not find red flags after the last visit. Inquire about pregnancy possibilities (not safe for patient to take Doxy if pregnant).

## 2017-01-03 NOTE — Telephone Encounter (Signed)
Copied from [place] [id number]. Topic: General - Other >> Jan 03, 2017  8:27 AM Carolyn Stare wrote:  Pt would like a call back she said she has questions about the lyme disease  pt is in a meeting until after 2 pm today    [phone]

## 2017-01-05 ENCOUNTER — Encounter: Payer: Self-pay | Admitting: Family Medicine

## 2017-01-05 ENCOUNTER — Ambulatory Visit: Payer: Self-pay | Admitting: *Deleted

## 2017-01-05 ENCOUNTER — Telehealth: Payer: Self-pay | Admitting: Family Medicine

## 2017-01-05 ENCOUNTER — Ambulatory Visit (INDEPENDENT_AMBULATORY_CARE_PROVIDER_SITE_OTHER): Payer: BLUE CROSS/BLUE SHIELD | Admitting: Family Medicine

## 2017-01-05 VITALS — BP 116/64 | HR 92 | Temp 98.4°F | Ht 66.0 in | Wt 149.2 lb

## 2017-01-05 DIAGNOSIS — R591 Generalized enlarged lymph nodes: Secondary | ICD-10-CM | POA: Diagnosis not present

## 2017-01-05 NOTE — Telephone Encounter (Signed)
Opened in error

## 2017-01-05 NOTE — Progress Notes (Signed)
   Subjective:  Alison Fleming is a 41 y.o. female who presents today with a chief complaint of lymphadenopathy.   HPI:  Lymphadenopathy, New Issue Patient first noted lymphadenopathy to her bilateral groin about 5 days ago.  Patient notes that she was recently started on doxycycline by her PCP for possible Lyme disease.  A day after starting her antibiotics she noticed the lymph nodes in her groin.  She also notes one lymph node in her right axilla.  No fevers or chills.  She had a little bit of nausea yesterday.  Also a little bit of loose stools yesterday.  No night sweats.  No weight loss.  In general, she is felt very fatigued and tired lately.  Also reports migratory muscular pains over the last several weeks which led to her diagnosis of Lyme disease.  No rashes.  No dysuria.  No skin lesions.  ROS: Per HPI  Objective:  Physical Exam: BP 116/64 (BP Location: Left Arm, Patient Position: Sitting, Cuff Size: Normal)   Pulse 92   Temp 98.4 F (36.9 C) (Oral)   Ht 5' 6"  (1.676 m)   Wt 149 lb 3.2 oz (67.7 kg)   LMP 12/26/2016   SpO2 99%   BMI 24.08 kg/m   Gen: NAD, resting comfortably CV: RRR with no murmurs appreciated Pulm: NWOB, CTAB with no crackles, wheezes, or rhonchi GI: Normal bowel sounds present. Soft, Nontender, Nondistended. MSK: Inguinal lymphadenopathy noted bilaterally.  Single mobile, tender lymph node palpated in right axilla.  Left axilla without any lymphadenopathy.  No supraclavicular lymphadenopathy.  No submandibular or cervical lymphadenopathy. Skin: Warm, dry Neuro: Grossly normal, moves all extremities Psych: Normal affect and thought content  Assessment/Plan:  Lymphadenopathy Patient with recent blood work 9 days ago that showed normal CBC.  Given that her lymph nodes have been only present for for 5 days and that she is currently being treated for Lyme disease, it is reasonable to continue with watchful waiting at this time.  She does not have any red  flag signs or symptoms including no unexplained weight loss, no fevers or chills, and no night sweats.  Reassured patient.  Discussed that there are several causes for lymph node swelling and that the vast majority are benign.  Her current lymphadenopathy is likely secondary to being started on doxycycline for Lyme disease.  Discussed warning signs and reasons to return to care including development of fevers or chills, unexplained weight loss, and night sweats.  Patient will follow-up with her PCP in approximately 4 weeks.  Time Spent: I spent 15 minutes face-to-face with the patient, with more than half spent on counseling for etiology of her lymphadenopathy and signs/symptoms to watch out for over the next several weeks.  Algis Greenhouse. Jerline Pain, MD 01/05/2017 11:06 AM

## 2017-01-05 NOTE — Telephone Encounter (Signed)
Pt called requesting appt today. Dx with Lymes Disease. Reports increased pain "all over" and swollen lymph nodes at groin. Currently on antibiotics. Appt made for 1040 with Dr.Parker.

## 2017-01-16 HISTORY — PX: COLONOSCOPY: SHX174

## 2017-01-26 ENCOUNTER — Encounter: Payer: Self-pay | Admitting: Family Medicine

## 2017-01-26 ENCOUNTER — Ambulatory Visit (INDEPENDENT_AMBULATORY_CARE_PROVIDER_SITE_OTHER): Payer: BLUE CROSS/BLUE SHIELD | Admitting: Family Medicine

## 2017-01-26 VITALS — BP 110/76 | HR 84 | Temp 98.6°F | Wt 147.4 lb

## 2017-01-26 DIAGNOSIS — R5383 Other fatigue: Secondary | ICD-10-CM | POA: Diagnosis not present

## 2017-01-26 LAB — CBC WITH DIFFERENTIAL/PLATELET
Basophils Absolute: 0.1 10*3/uL (ref 0.0–0.1)
Basophils Relative: 1.1 % (ref 0.0–3.0)
Eosinophils Absolute: 0.1 10*3/uL (ref 0.0–0.7)
Eosinophils Relative: 2.6 % (ref 0.0–5.0)
HCT: 38.9 % (ref 36.0–46.0)
Hemoglobin: 13.4 g/dL (ref 12.0–15.0)
Lymphocytes Relative: 25.7 % (ref 12.0–46.0)
Lymphs Abs: 1.2 10*3/uL (ref 0.7–4.0)
MCHC: 34.5 g/dL (ref 30.0–36.0)
MCV: 91.8 fl (ref 78.0–100.0)
Monocytes Absolute: 0.4 10*3/uL (ref 0.1–1.0)
Monocytes Relative: 7.8 % (ref 3.0–12.0)
Neutro Abs: 3 10*3/uL (ref 1.4–7.7)
Neutrophils Relative %: 62.8 % (ref 43.0–77.0)
Platelets: 303 10*3/uL (ref 150.0–400.0)
RBC: 4.24 Mil/uL (ref 3.87–5.11)
RDW: 13 % (ref 11.5–15.5)
WBC: 4.7 10*3/uL (ref 4.0–10.5)

## 2017-01-26 LAB — COMPREHENSIVE METABOLIC PANEL
ALT: 28 U/L (ref 0–35)
AST: 24 U/L (ref 0–37)
Albumin: 4.3 g/dL (ref 3.5–5.2)
Alkaline Phosphatase: 34 U/L — ABNORMAL LOW (ref 39–117)
BUN: 12 mg/dL (ref 6–23)
CO2: 27 mEq/L (ref 19–32)
Calcium: 9.1 mg/dL (ref 8.4–10.5)
Chloride: 101 mEq/L (ref 96–112)
Creatinine, Ser: 0.82 mg/dL (ref 0.40–1.20)
GFR: 82.02 mL/min (ref >60.00)
Glucose, Bld: 85 mg/dL (ref 70–99)
Potassium: 3.7 mEq/L (ref 3.5–5.1)
Sodium: 137 mEq/L (ref 135–145)
Total Bilirubin: 0.6 mg/dL (ref 0.2–1.2)
Total Protein: 7.4 g/dL (ref 6.0–8.3)

## 2017-01-26 LAB — C-REACTIVE PROTEIN: CRP: 0.2 mg/dL — ABNORMAL LOW (ref 0.5–20.0)

## 2017-01-26 LAB — SEDIMENTATION RATE: Sed Rate: 4 mm/hr (ref 0–20)

## 2017-01-26 NOTE — Progress Notes (Signed)
Alison Fleming is a 42 y.o. female is here for follow up.  History of Present Illness:   HPI: Alison Fleming presents for follow-up.  She has recently finished doxycycline for presumed Lyme's disease.  She endorses improvement in myalgias and arthralgias.  Her fatigue is also improved somewhat.  After starting the doxycycline, she had complaints of lymphadenopathy and was evaluated in this clinic.  This improved with further treatment.  She continues to have some dyspepsia and epigastric pain.  This was worsened by the doxycycline.  She does have an appointment with gastroenterology for an EGD.  She continues to take a proton pump inhibitor daily with Zantac as needed.  There are no preventive care reminders to display for this patient.   Depression screen PHQ 2/9 10/12/2016  Decreased Interest 0  Down, Depressed, Hopeless 0  PHQ - 2 Score 0   PMHx, SurgHx, SocialHx, FamHx, Medications, and Allergies were reviewed in the Visit Navigator and updated as appropriate.   Patient Active Problem List   Diagnosis Date Noted  . GAD (generalized anxiety disorder) 05/10/2016   Social History   Tobacco Use  . Smoking status: Never Smoker  . Smokeless tobacco: Never Used  Substance Use Topics  . Alcohol use: Yes    Alcohol/week: 1.2 oz    Types: 2 Glasses of wine per week  . Drug use: No   Current Medications and Allergies:   .  acyclovir (ZOVIRAX) 400 MG tablet, Take 1 tablet (400 mg total) by mouth 3 (three) times daily. Take for 5 days as needed., Disp: 30 tablet, Rfl: 12 .  cholecalciferol (VITAMIN D) 1000 units tablet, Take 1,000 Units by mouth daily., Disp: , Rfl:  .  Cranberry 600 MG TABS, Take 3,600 mg by mouth daily., Disp: , Rfl:  .  cyanocobalamin 100 MCG tablet, Take 100 mcg by mouth daily., Disp: , Rfl:  .  Norethindrone Acetate-Ethinyl Estradiol (MICROGESTIN) 1.5-30 MG-MCG tablet, Take 1 tablet by mouth daily., Disp: 3 Package, Rfl: 1 .  omeprazole (PRILOSEC) 40 MG capsule, Take  1 capsule (40 mg total) by mouth daily., Disp: 30 capsule, Rfl: 3 .  Prenatal Vit-Fe Fumarate-FA (MULTIVITAMIN-PRENATAL) 27-0.8 MG TABS, Take 1 tablet by mouth daily., Disp: , Rfl:   No Known Allergies   Review of Systems   Pertinent items are noted in the HPI. Otherwise, ROS is negative.  Vitals:   Vitals:   01/26/17 0727  BP: 110/76  Pulse: 84  Temp: 98.6 F (37 C)  TempSrc: Oral  SpO2: 98%  Weight: 147 lb 6.4 oz (66.9 kg)     Body mass index is 23.79 kg/m.   Physical Exam:   Physical Exam  Constitutional: She is oriented to person, place, and time. She appears well-developed and well-nourished. No distress.  HENT:  Head: Normocephalic and atraumatic.  Right Ear: External ear normal.  Left Ear: External ear normal.  Nose: Nose normal.  Mouth/Throat: Oropharynx is clear and moist.  Eyes: Conjunctivae and EOM are normal. Pupils are equal, round, and reactive to light.  Neck: Normal range of motion. Neck supple. No thyromegaly present.  Cardiovascular: Normal rate, regular rhythm, normal heart sounds and intact distal pulses.  Pulmonary/Chest: Effort normal and breath sounds normal.  Abdominal: Soft. Bowel sounds are normal.  Musculoskeletal: Normal range of motion.  Lymphadenopathy:    She has no cervical adenopathy.  Neurological: She is alert and oriented to person, place, and time.  Skin: Skin is warm and dry. Capillary refill takes less  than 2 seconds.  Psychiatric: She has a normal mood and affect. Her behavior is normal.  Nursing note and vitals reviewed.  Assessment and Plan:   Alison Fleming was seen today for follow-up.  Diagnoses and all orders for this visit:  Other fatigue Comments: Improving.  Patient would like retesting.  See below. Orders: -     Sedimentation rate -     C-reactive protein -     CBC with Differential/Platelet -     Comprehensive metabolic panel -     ANA -     Rheumatoid Factor -     B. Burgdorfi Antibodies by WB   . Reviewed  expectations re: course of current medical issues. . Discussed self-management of symptoms. . Outlined signs and symptoms indicating need for more acute intervention. . Patient verbalized understanding and all questions were answered. [name] Health Maintenance issues including appropriate healthy diet, exercise, and smoking avoidance were discussed with patient. . See orders for this visit as documented in the electronic medical record. . Patient received an After Visit Summary.  [name], DO [facility] 01/27/2017  Future Appointments  Date Time Provider [place]  02/23/2017  1:00 PM Regina Eck, CNM [place] None  03/08/2017  9:00 AM Mauri Pole, MD [facility]  04/27/2017  7:20 AM [name], DO [facility]   Records requested if needed. Time spent with the patient: 25 minutes, of which >50% was spent in obtaining information about her symptoms, reviewing her previous labs, evaluations, and treatments, counseling her about her condition (please see the discussed topics above), and developing a plan to further investigate it; she had a number of questions which I addressed.

## 2017-01-29 ENCOUNTER — Telehealth: Payer: Self-pay | Admitting: Family Medicine

## 2017-01-29 NOTE — Telephone Encounter (Signed)
All labs are not back.

## 2017-01-29 NOTE — Telephone Encounter (Signed)
Please call patient to update once her lab results are released in [place].

## 2017-01-29 NOTE — Telephone Encounter (Signed)
Patient called and let her know that all results would be released and we will call her after Dr. Juleen Fleming receives and reviews.

## 2017-01-29 NOTE — Telephone Encounter (Signed)
Copied from [place] [id number]. Topic: Quick Communication - Lab Results >> Jan 29, 2017  2:30 PM Robina Ade, Helene Kelp D wrote: Patient called and would like to know her lab results for 01/26/17. If the results can also be release to her mychart also, that would be great. Please call patient back, thanks.

## 2017-01-29 NOTE — Telephone Encounter (Signed)
See note

## 2017-01-30 LAB — B. BURGDORFI ANTIBODIES BY WB
B burgdorferi IgG Abs (IB): NEGATIVE
B burgdorferi IgM Abs (IB): POSITIVE — AB
Lyme Disease 18 kD IgG: NONREACTIVE
Lyme Disease 23 kD IgG: NONREACTIVE
Lyme Disease 23 kD IgM: REACTIVE — AB
Lyme Disease 28 kD IgG: NONREACTIVE
Lyme Disease 30 kD IgG: NONREACTIVE
Lyme Disease 39 kD IgG: NONREACTIVE
Lyme Disease 39 kD IgM: NONREACTIVE
Lyme Disease 41 kD IgG: NONREACTIVE
Lyme Disease 41 kD IgM: REACTIVE — AB
Lyme Disease 45 kD IgG: NONREACTIVE
Lyme Disease 58 kD IgG: NONREACTIVE
Lyme Disease 66 kD IgG: NONREACTIVE
Lyme Disease 93 kD IgG: NONREACTIVE

## 2017-01-30 LAB — RHEUMATOID FACTOR: Rhuematoid fact SerPl-aCnc: <14 IU/mL (ref <14)

## 2017-01-30 LAB — ANA: Anti Nuclear Antibody(ANA): NEGATIVE

## 2017-02-16 ENCOUNTER — Encounter: Payer: BLUE CROSS/BLUE SHIELD | Admitting: Gastroenterology

## 2017-02-22 ENCOUNTER — Encounter: Payer: Self-pay | Admitting: Gastroenterology

## 2017-02-23 ENCOUNTER — Ambulatory Visit (INDEPENDENT_AMBULATORY_CARE_PROVIDER_SITE_OTHER): Payer: BLUE CROSS/BLUE SHIELD | Admitting: Certified Nurse Midwife

## 2017-02-23 ENCOUNTER — Other Ambulatory Visit (HOSPITAL_COMMUNITY)
Admission: RE | Admit: 2017-02-23 | Discharge: 2017-02-23 | Disposition: A | Discharge status: Home / Self Care | Payer: BLUE CROSS/BLUE SHIELD | Source: Ambulatory Visit | Attending: Certified Nurse Midwife | Admitting: Certified Nurse Midwife

## 2017-02-23 ENCOUNTER — Ambulatory Visit: Payer: BLUE CROSS/BLUE SHIELD | Admitting: Nurse Practitioner

## 2017-02-23 ENCOUNTER — Encounter: Payer: Self-pay | Admitting: Certified Nurse Midwife

## 2017-02-23 ENCOUNTER — Other Ambulatory Visit: Payer: Self-pay

## 2017-02-23 VITALS — BP 100/64 | HR 64 | Resp 16 | Ht 66.25 in | Wt 145.0 lb

## 2017-02-23 DIAGNOSIS — Z01419 Encounter for gynecological examination (general) (routine) without abnormal findings: Secondary | ICD-10-CM

## 2017-02-23 DIAGNOSIS — N898 Other specified noninflammatory disorders of vagina: Secondary | ICD-10-CM | POA: Insufficient documentation

## 2017-02-23 DIAGNOSIS — Z3041 Encounter for surveillance of contraceptive pills: Secondary | ICD-10-CM | POA: Diagnosis not present

## 2017-02-23 DIAGNOSIS — Z124 Encounter for screening for malignant neoplasm of cervix: Secondary | ICD-10-CM | POA: Diagnosis not present

## 2017-02-23 MED ORDER — NORETHINDRONE ACET-ETHINYL EST 1.5-30 MG-MCG PO TABS: 1.0000 | ORAL_TABLET | Freq: Every day | ORAL | 4 refills | Status: DC

## 2017-02-23 NOTE — Progress Notes (Signed)
42 y.o. [id number] Married  Caucasian Fe here for annual exam. Periods normal, with spotting still occurring randomly only 3-4 times per month. Not with sexual activity. Having vaginal itching off and on and would like checked today. No new personal products or changes.Contraception OCP working well, desires continuation even though she has BTB occasionally( took a long time to find one that worked for her). Has noted lump in right arm pit several hours after shaving and then is gone the next morning.? Etiology. No redness or tenderness, when present. Mammogram this year normal. Perineal bump still present after two years. Seen by Dr. Quincy Simmonds, no issues unless patient is having pain with, which she is not. Sees PCP for aex, labs yearly. No other health issues today.  Patient's last menstrual period was 02/20/2017 (exact date).          Sexually active: Yes.    The current method of family planning is OCP (estrogen/progesterone).    Exercising: No.  exercise Smoker:  no  Health Maintenance: Pap:  02-02-15 neg, 02-15-16 neg request pap yearly History of Abnormal Pap: no MMG:  12-22-16 category d density birads 1:neg Self Breast exams: pt tries Colonoscopy:  none BMD:   none TDaP:  2017 Shingles: no Pneumonia: no Hep C and HIV: done during pregnancy Labs: no   reports that  has never smoked. she has never used smokeless tobacco. She reports that she drinks about 0.6 - 1.2 oz of alcohol per week. She reports that she does not use drugs.  Past Medical History:  Diagnosis Date  . Anxiety   . Positive Lyme disease serology   . Seasonal allergies     Past Surgical History:  Procedure Laterality Date  . DILATATION & CURRETTAGE/HYSTEROSCOPY WITH RESECTOCOPE  02/12/2012   Procedure: [place];  Surgeon: Azalia Bilis, MD;  Location: [facility];  Service: Gynecology;  Laterality: N/A;  . DILATION AND CURETTAGE OF UTERUS  2010  . DILATION AND CURETTAGE OF UTERUS   2012  . SPINAL FUSION  2008   rods on both sides of entire spinal column for scoliocis    Current Outpatient Medications  Medication Sig Dispense Refill  . cholecalciferol (VITAMIN D) 1000 units tablet Take 1,000 Units by mouth daily.    . Cranberry 600 MG TABS Take 3,600 mg by mouth daily.    . cyanocobalamin 100 MCG tablet Take 100 mcg by mouth daily.    . Norethindrone Acetate-Ethinyl Estradiol (MICROGESTIN) 1.5-30 MG-MCG tablet Take 1 tablet by mouth daily. 3 Package 1  . omeprazole (PRILOSEC) 40 MG capsule Take 1 capsule (40 mg total) by mouth daily. 30 capsule 3  . Prenatal Vit-Fe Fumarate-FA (MULTIVITAMIN-PRENATAL) 27-0.8 MG TABS Take 1 tablet by mouth daily.    . RaNITidine HCl (ZANTAC PO) Take by mouth.    [name] acyclovir (ZOVIRAX) 400 MG tablet Take 1 tablet (400 mg total) by mouth 3 (three) times daily. Take for 5 days as needed. (Patient not taking: Reported on 02/23/2017) 30 tablet 12   No current facility-administered medications for this visit.     Family History  Problem Relation Age of Onset  . Diabetes Maternal Grandmother   . Hypertension Maternal Grandmother   . Heart disease Maternal Grandmother        Heart attack  . Heart attack Maternal Grandmother   . Osteoporosis Maternal Grandmother   . Diabetes Maternal Grandfather   . Hypertension Maternal Grandfather   . Heart disease Maternal Grandfather  heart attack  . Heart attack Maternal Grandfather   . Hypertension Mother   . Anxiety disorder Mother     ROS:  Pertinent items are noted in HPI.  Otherwise, a comprehensive ROS was negative.  Exam:   BP 100/64   Pulse 64   Resp 16   Ht 5' 6.25" (1.683 m)   Wt 145 lb (65.8 kg)   LMP 02/20/2017 (Exact Date)   BMI 23.23 kg/m  Height: 5' 6.25" (168.3 cm) Ht Readings from Last 3 Encounters:  02/23/17 5' 6.25" (1.683 m)  01/05/17 5' 6"  (1.676 m)  01/01/17 5' 6"  (1.676 m)    General appearance: alert, cooperative and appears stated age Head:  Normocephalic, without obvious abnormality, atraumatic Neck: no adenopathy, supple, symmetrical, trachea midline and thyroid normal to inspection and palpation Lungs: clear to auscultation bilaterally Breasts: normal appearance, no masses or tenderness, No nipple retraction or dimpling, No nipple discharge or bleeding, No axillary or supraclavicular adenopathy Heart: regular rate and rhythm Abdomen: soft, non-tender; no masses,  no organomegaly Extremities: extremities normal, atraumatic, no cyanosis or edema Skin: Skin color, texture, turgor normal. No rashes or lesions Lymph nodes: Cervical, supraclavicular, and axillary nodes normal. No abnormal inguinal nodes palpated Neurologic: Grossly normal   Pelvic: External genitalia:  no lesions              Urethra:  normal appearing urethra with no masses, tenderness or lesions              Bartholin's and Skene's: normal                 Vagina: normal appearing vagina with normal color and discharge, no lesions              Cervix: no cervical motion tenderness, no lesions and ectropian appearance              Pap taken: Yes.   Bimanual Exam:  Uterus:  normal size, contour, position, consistency, mobility, non-tender and anteverted              Adnexa: normal adnexa and no mass, fullness, tenderness               Rectovaginal: Confirms               Anus:  normal sphincter tone, no lesions  Chaperone present: yes  A:  Well Woman with normal exam  Contraception OCP desired  Perineal ?cyst/varicosity, not symptomatic unchanged per patient  Vaginal itching R/O vaginal infection  Lymph node, Axillary enlargement with shaving and deodorant use, resolves spontaneous   MD management of Vitamin D and Zovirax  P:   Reviewed health and wellness pertinent to exam  Discussed risks/benefits/warning signs with OCP. Desires continuance.  Rx Microgestin 1.5/30 see order with instructions  Discussed finding and per notes from previous exam and  patient input unchanged. Patient will advise if change or tenderness.  Labs:Affirm  Discussed reactive lymph node with deodorant use immediately after shaving suspected. Leave of deodorant overnight and see if does not occur. Needs to advise if lymph nodes remain enlarged. Patient voiced understanding.  Continue follow up with PCP as indicated  Pap smear: yes   counseled on breast self exam, mammography screening, use and side effects of OCP's, adequate intake of calcium and vitamin D, diet and exercise  return annually or prn  An After Visit Summary was printed and given to the patient.

## 2017-02-24 LAB — VAGINITIS/VAGINOSIS, DNA PROBE
CANDIDA SPECIES: NEGATIVE
Gardnerella vaginalis: NEGATIVE
Trichomonas vaginosis: NEGATIVE

## 2017-02-27 LAB — CYTOLOGY - PAP
DIAGNOSIS: NEGATIVE
HPV (WINDOPATH): NOT DETECTED

## 2017-03-02 ENCOUNTER — Encounter: Payer: BLUE CROSS/BLUE SHIELD | Admitting: Gastroenterology

## 2017-03-08 ENCOUNTER — Ambulatory Visit (AMBULATORY_SURGERY_CENTER): Payer: BLUE CROSS/BLUE SHIELD | Admitting: Gastroenterology

## 2017-03-08 ENCOUNTER — Encounter: Payer: Self-pay | Admitting: Gastroenterology

## 2017-03-08 ENCOUNTER — Other Ambulatory Visit: Payer: Self-pay

## 2017-03-08 VITALS — BP 108/66 | HR 65 | Temp 98.4°F | Resp 15 | Ht 66.0 in | Wt 149.0 lb

## 2017-03-08 DIAGNOSIS — R1013 Epigastric pain: Secondary | ICD-10-CM | POA: Diagnosis not present

## 2017-03-08 MED ORDER — SODIUM CHLORIDE 0.9 % IV SOLN: 500.0000 mL | Freq: Once | INTRAVENOUS | Status: DC

## 2017-03-08 MED ORDER — OMEPRAZOLE 40 MG PO CPDR: 40.0000 mg | DELAYED_RELEASE_CAPSULE | Freq: Every day | ORAL | 11 refills | Status: DC

## 2017-03-08 NOTE — Op Note (Signed)
[place] Patient Name: Alison Fleming Procedure Date: 03/08/2017 9:07 AM MRN: [id number] Endoscopist: Mauri Pole , MD Age: 42 Referring MD:  Date of Birth: 12/02/1975 Gender: Female Account #: [id number] Procedure:                Upper GI endoscopy Indications:              Epigastric abdominal pain, Dyspepsia, Heartburn Medicines:                Monitored Anesthesia Care Procedure:                Pre-Anesthesia Assessment:                           - Prior to the procedure, a History and Physical                            was performed, and patient medications and                            allergies were reviewed. The patient's tolerance of                            previous anesthesia was also reviewed. The risks                            and benefits of the procedure and the sedation                            options and risks were discussed with the patient.                            All questions were answered, and informed consent                            was obtained. Prior Anticoagulants: The patient has                            taken no previous anticoagulant or antiplatelet                            agents. ASA Grade Assessment: I - A normal, healthy                            patient. After reviewing the risks and benefits,                            the patient was deemed in satisfactory condition to                            undergo the procedure.                           After obtaining informed consent, the endoscope was  passed under direct vision. Throughout the                            procedure, the patient's blood pressure, pulse, and                            oxygen saturations were monitored continuously. The                            Model GIF-HQ190 [id number]) scope was introduced                            through the mouth, and advanced to the second part                            of duodenum.  The upper GI endoscopy was                            accomplished without difficulty. The patient                            tolerated the procedure well. Scope In: Scope Out: Findings:                 Esophagogastric landmarks were identified: the                            Z-line was found at 35 cm and the gastroesophageal                            junction was found at 35 cm from the incisors.                           No gross lesions were noted in the entire esophagus.                           The stomach was normal.                           The examined duodenum was normal. Complications:            No immediate complications. Estimated Blood Loss:     Estimated blood loss: none. Impression:               - Esophagogastric landmarks identified.                           - No gross lesions in esophagus.                           - Normal stomach.                           - Normal examined duodenum.                           - No specimens collected.  Recommendation:           - Patient has a contact number available for                            emergencies. The signs and symptoms of potential                            delayed complications were discussed with the                            patient. Return to normal activities tomorrow.                            Written discharge instructions were provided to the                            patient.                           - Resume previous diet.                           - Continue present medications.                           - Follow an antireflux regimen for the rest of the                            patient's life.                           - Use Prilosec (omeprazole) 20 mg PO daily PRN. Mauri Pole, MD 03/08/2017 9:16:14 AM This report has been signed electronically.

## 2017-03-08 NOTE — Progress Notes (Signed)
Report given to PACU, vss 

## 2017-03-08 NOTE — Patient Instructions (Signed)
YOU HAD AN ENDOSCOPIC PROCEDURE TODAY AT THE [facility]:   Refer to the procedure report that was given to you for any specific questions about what was found during the examination.  If the procedure report does not answer your questions, please call your gastroenterologist to clarify.  If you requested that your care partner not be given the details of your procedure findings, then the procedure report has been included in a sealed envelope for you to review at your convenience later.  YOU SHOULD EXPECT: Some feelings of bloating in the abdomen. Passage of more gas than usual.  Walking can help get rid of the air that was put into your GI tract during the procedure and reduce the bloating. If you had a lower endoscopy (such as a colonoscopy or flexible sigmoidoscopy) you may notice spotting of blood in your stool or on the toilet paper. If you underwent a bowel prep for your procedure, you may not have a normal bowel movement for a few days.  Please Note:  You might notice some irritation and congestion in your nose or some drainage.  This is from the oxygen used during your procedure.  There is no need for concern and it should clear up in a day or so.  SYMPTOMS TO REPORT IMMEDIATELY:   Following upper endoscopy (EGD)  Vomiting of blood or coffee ground material  New chest pain or pain under the shoulder blades  Painful or persistently difficult swallowing  New shortness of breath  Fever of 100F or higher  Black, tarry-looking stools  For urgent or emergent issues, a gastroenterologist can be reached at any hour by calling [phone].   DIET:  We do recommend a small meal at first, but then you may proceed to your regular diet.  Drink plenty of fluids but you should avoid alcoholic beverages for 24 hours.  ACTIVITY:  You should plan to take it easy for the rest of today and you should NOT DRIVE or use heavy machinery until tomorrow (because of the sedation medicines used  during the test).    FOLLOW UP: Our staff will call the number listed on your records the next business day following your procedure to check on you and address any questions or concerns that you may have regarding the information given to you following your procedure. If we do not reach you, we will leave a message.  However, if you are feeling well and you are not experiencing any problems, there is no need to return our call.  We will assume that you have returned to your regular daily activities without incident.  If any biopsies were taken you will be contacted by phone or by letter within the next 1-3 weeks.  Please call us at [phone] if you have not heard about the biopsies in 3 weeks.    SIGNATURES/CONFIDENTIALITY: You and/or your care partner have signed paperwork which will be entered into your electronic medical record.  These signatures attest to the fact that that the information above on your After Visit Summary has been reviewed and is understood.  Full responsibility of the confidentiality of this discharge information lies with you and/or your care-partner. 

## 2017-03-09 ENCOUNTER — Telehealth: Payer: Self-pay | Admitting: *Deleted

## 2017-03-09 NOTE — Telephone Encounter (Signed)
  Follow up Call-  Call back number 03/08/2017  Post procedure Call Back phone  # [phone]  Permission to leave phone message Yes  Some recent data might be hidden     Patient questions:  Do you have a fever, pain , or abdominal swelling? No. Pain Score  0 *  Have you tolerated food without any problems? Yes.    Have you been able to return to your normal activities? Yes.    Do you have any questions about your discharge instructions: Diet   No. Medications  No. Follow up visit  No.  Do you have questions or concerns about your Care? No.  Actions: * If pain score is 4 or above: No action needed, pain <4.

## 2017-03-16 ENCOUNTER — Telehealth: Payer: Self-pay | Admitting: Certified Nurse Midwife

## 2017-03-16 ENCOUNTER — Encounter: Payer: Self-pay | Admitting: Certified Nurse Midwife

## 2017-03-16 NOTE — Telephone Encounter (Addendum)
Spoke with patient in regards to MyChart message as seen below. Patient confirms spotting from vagina, notices with bowel movements. Last BM this morning, normal, no straining. LMP 02/20/17. On current OCP for over a year, no missed or late pills. Denies any other GYN symptoms or pain. Patient states she was advised to return call if continued. Advised will review with Melvia Heaps, CNM and return call.  Melvia Heaps, CNM please review and advise?

## 2017-03-16 NOTE — Telephone Encounter (Signed)
Left detailed message, ok per current dpr. Advised OV recommended for further evaluation, return call to office to schedule.   Routing to provider for final review. Patient is agreeable to disposition. Will close encounter.

## 2017-03-16 NOTE — Telephone Encounter (Signed)
I think she needs an OV to assess.

## 2017-03-16 NOTE — Telephone Encounter (Signed)
-----   Message from [place], Generic sent at 03/16/2017 10:04 AM EST -----    Hello [name],   As a follow up from my recent visit, I am nearing the end of my 3rd week of birth control pills for this month. You may recall in my records I've had on again, off again light spotting between periods for a while now. Typically its accompanied by a large or strenuous bowel movement and the bright red vaginal blood is just on the toilet paper when I wipe. Today, with a normal, not strenuous, not overly large bowel movement, I had vaginal bleeding in the toilet and more than normal on the toilet paper. I have no other symptoms, no cramps or anything and I haven't missed a pill or taken one at a different time than usual, it's typically taken within the same hour each morning. Is this something I should be concerned about? Thank you very much for your time and help!  Alison Fleming

## 2017-03-19 NOTE — Telephone Encounter (Signed)
Patient returning [place] call. Call back [phone].

## 2017-03-23 ENCOUNTER — Telehealth: Payer: Self-pay | Admitting: Certified Nurse Midwife

## 2017-03-23 NOTE — Telephone Encounter (Signed)
Spoke with patient, OV scheduled for 03/28/17 at 10am with Melvia Heaps, CNM.   Routing to provider for final review. Patient is agreeable to disposition. Will close encounter.

## 2017-03-23 NOTE — Telephone Encounter (Signed)
Message   ----- Message from [place], Generic sent at 03/21/2017 8:42 AM EST -----    Thank you for the message. Should I wait until next week after my period?  ----- Message -----  From: Nurse Naaman Plummer  Sent: 03/21/2017 8:14 AM EST  To: Alison Fleming  Subject: RE: Visit Follow-Up Question  Pryor Montes CNM has reviewed your message and recommends that you return to the office for further evaluation. Please contact the office to schedule an appointment with Melvia Heaps CNM.    Sincerely,    Reesa Chew, RN    ----- Message -----   From: Alison Fleming   Sent: 03/16/2017 10:04 AM EST    To: Melvia Heaps, CNM  Subject: Visit Follow-Up Question    Hello [name],   As a follow up from my recent visit, I am nearing the end of my 3rd week of birth control pills for this month. You may recall in my records I've had on again, off again light spotting between periods for a while now. Typically its accompanied by a large or strenuous bowel movement and the bright red vaginal blood is just on the toilet paper when I wipe. Today, with a normal, not strenuous, not overly large bowel movement, I had vaginal bleeding in the toilet and more than normal on the toilet paper. I have no other symptoms, no cramps or anything and I haven't missed a pill or taken one at a different time than usual, it's typically taken within the same hour each morning. Is this something I should be concerned about? Thank you very much for your time and help!  Alison Fleming

## 2017-03-25 NOTE — Progress Notes (Signed)
Alison Fleming is a 42 y.o. female is here for follow up.  History of Present Illness:   HPI:   1. GAD (generalized anxiety disorder). Ongoing. Worsening. Trouble going to sleep and staying asleep due to racing thoughts. She acknowledges that her IBS, tension headaches, and malaise are likely related to or at least exacerbated by her anxiety.    2. Dyspepsia, s/p normal EGD 03/08/17, with recommendation for lifetime anti-reflux regimen. Was doing well. Now, noting some RUQ discomfort. Still on PPI/Zantac.    3. History of spinal fusion for scoliosis. No concerns.   4. Malaise and fatigue. As above. Also associated with polyarthralgia.    5. Left upper quadrant pain. New, x 1-2 weeks, intermittent, cramping. No radiation. Possibly associated with constipation.    6. Tension headache. New. Bandlike. Starts in the neck.   7. Adjustment insomnia. As above.   ROS: Per HPI, otherwise negative.  There are no preventive care reminders to display for this patient.   Depression screen PHQ 2/9 10/12/2016  Decreased Interest 0  Down, Depressed, Hopeless 0  PHQ - 2 Score 0   PMHx, SurgHx, SocialHx, FamHx, Medications, and Allergies were reviewed in the Visit Navigator and updated as appropriate.   Patient Active Problem List   Diagnosis Date Noted  . Dyspepsia, s/p normal EGD 03/08/17, with recommendation for lifetime anti-reflux regimen 03/26/2017  . History of spinal fusion for scoliosis 03/26/2017  . Malaise and fatigue 03/26/2017  . Left upper quadrant pain 03/26/2017  . Tension headache 03/26/2017  . 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 GAD (generalized anxiety disorder) 05/10/2016   Social History   Tobacco Use  . Smoking status: Never Smoker  . Smokeless tobacco: Never Used  Substance Use Topics  . Alcohol use: Yes    Alcohol/week: 0.6 - 1.2 oz    Types: 1 - 2 Standard drinks or equivalent per week  . Drug use: No   Current Medications and Allergies:   .  acyclovir  (ZOVIRAX) 400 MG tablet, Take 1 tablet (400 mg total) by mouth 3 (three) times daily. Take for 5 days as needed. (Patient not taking: Reported on 02/23/2017), Disp: 30 tablet, Rfl: 12 .  cholecalciferol (VITAMIN D) 1000 units tablet, Take 1,000 Units by mouth daily., Disp: , Rfl:  .  Cranberry 600 MG TABS, Take 3,600 mg by mouth daily., Disp: , Rfl:  .  cyanocobalamin 100 MCG tablet, Take 100 mcg by mouth daily., Disp: , Rfl:  .  Norethindrone Acetate-Ethinyl Estradiol (MICROGESTIN) 1.5-30 MG-MCG tablet, Take 1 tablet by mouth daily., Disp: 3 Package, Rfl: 4 .  omeprazole (PRILOSEC) 40 MG capsule, Take 1 capsule (40 mg total) by mouth daily., Disp: 30 capsule, Rfl: 11 .  Prenatal Vit-Fe Fumarate-FA (MULTIVITAMIN-PRENATAL) 27-0.8 MG TABS, Take 1 tablet by mouth daily., Disp: , Rfl:  .  RaNITidine HCl (ZANTAC PO), Take by mouth., Disp: , Rfl:   No Known Allergies   Review of Systems   Pertinent items are noted in the HPI. Otherwise, ROS is negative.  Vitals:   Vitals:   03/26/17 0727  BP: 110/66  Pulse: 78  Temp: 98.1 F (36.7 C)  SpO2: 98%  Weight: 147 lb 3.2 oz (66.8 kg)     Body mass index is 23.76 kg/m.  Physical Exam:   Physical Exam  Constitutional: She is oriented to person, place, and time. She appears well-developed and well-nourished. No distress.  HENT:  Head: Normocephalic and atraumatic.  Right Ear: External ear normal.  Left  Ear: External ear normal.  Nose: Nose normal.  Mouth/Throat: Oropharynx is clear and moist.  Eyes: Conjunctivae and EOM are normal. Pupils are equal, round, and reactive to light.  Neck: Normal range of motion. Neck supple. No thyromegaly present.  Cardiovascular: Normal rate, regular rhythm, normal heart sounds and intact distal pulses.  Pulmonary/Chest: Effort normal and breath sounds normal.  Abdominal: Soft. Bowel sounds are normal.  Musculoskeletal: Normal range of motion.  Lymphadenopathy:    She has no cervical adenopathy.    Neurological: She is alert and oriented to person, place, and time.  Skin: Skin is warm and dry. Capillary refill takes less than 2 seconds.  Psychiatric: She has a normal mood and affect. Her behavior is normal.  Nursing note and vitals reviewed.  Assessment and Plan:   Patient Active Problem List   Diagnosis Date Noted  . Dyspepsia, s/p normal EGD 03/08/17, with recommendation for lifetime anti-reflux regimen 03/26/2017  . History of spinal fusion for scoliosis 03/26/2017  . Malaise and fatigue 03/26/2017  . Left upper quadrant pain 03/26/2017  . Tension headache 03/26/2017  . 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 GAD (generalized anxiety disorder) 05/10/2016   Diagnoses and all orders for this visit:  GAD (generalized anxiety disorder) Comments: We reviewed options for treatment.  Orders: -     FLUoxetine (PROZAC) 20 MG tablet; Take 1 tablet (20 mg total) by mouth daily. (Patient not taking: Reported on 03/28/2017)  Dyspepsia, s/p normal EGD 03/08/17, with recommendation for lifetime anti-reflux regimen Comments: Reviewed recent EGD with patient. She is doing fairly well with the PPI.  History of spinal fusion for scoliosis Comments: With chronic intermittent pain in thoracolumbar spine, without current issues.  Malaise and fatigue Comments: Ongoing.  Left upper quadrant pain Comments: Likely due to gas/constipation versus IBS. Labs pending. Orders: -     CBC with Differential/Platelet -     Comprehensive metabolic panel -     Lipase  Tension headache Comments: New. Associated with other stress-assoicated symptoms. Reviewed stretches, meditation, red flags.  Adjustment insomnia Comments: Having a hard going to and staying asleep. Likely stress related and exacerbating all other somatic complaints.   Orders Placed This Encounter  Procedures  . CBC with Differential/Platelet  . Comprehensive metabolic panel  . Lipase   Meds ordered this encounter  Medications   . FLUoxetine (PROZAC) 20 MG tablet    Sig: Take 1 tablet (20 mg total) by mouth daily.    Dispense:  30 tablet    Refill:  3    . Reviewed expectations re: course of current medical issues. . Discussed self-management of symptoms. . Outlined signs and symptoms indicating need for more acute intervention. . Patient verbalized understanding and all questions were answered. [name] Health Maintenance issues including appropriate healthy diet, exercise, and smoking avoidance were discussed with patient. . See orders for this visit as documented in the electronic medical record. . Patient received an After Visit Summary.  CMA served as Education administrator during this visit. History, Physical, and Plan performed by medical provider. The above documentation has been reviewed and is accurate and complete. [name], D.O.  [name], DO [place], [place] [facility] 03/28/2017

## 2017-03-26 ENCOUNTER — Ambulatory Visit (INDEPENDENT_AMBULATORY_CARE_PROVIDER_SITE_OTHER): Payer: BLUE CROSS/BLUE SHIELD | Admitting: Family Medicine

## 2017-03-26 ENCOUNTER — Encounter: Payer: Self-pay | Admitting: Family Medicine

## 2017-03-26 VITALS — BP 110/66 | HR 78 | Temp 98.1°F | Wt 147.2 lb

## 2017-03-26 DIAGNOSIS — F411 Generalized anxiety disorder: Secondary | ICD-10-CM | POA: Diagnosis not present

## 2017-03-26 DIAGNOSIS — M2550 Pain in unspecified joint: Secondary | ICD-10-CM | POA: Diagnosis not present

## 2017-03-26 DIAGNOSIS — M542 Cervicalgia: Secondary | ICD-10-CM | POA: Insufficient documentation

## 2017-03-26 DIAGNOSIS — G44209 Tension-type headache, unspecified, not intractable: Secondary | ICD-10-CM | POA: Insufficient documentation

## 2017-03-26 DIAGNOSIS — F5102 Adjustment insomnia: Secondary | ICD-10-CM | POA: Diagnosis not present

## 2017-03-26 DIAGNOSIS — R5383 Other fatigue: Secondary | ICD-10-CM

## 2017-03-26 DIAGNOSIS — R1012 Left upper quadrant pain: Secondary | ICD-10-CM | POA: Diagnosis not present

## 2017-03-26 DIAGNOSIS — R1013 Epigastric pain: Secondary | ICD-10-CM | POA: Diagnosis not present

## 2017-03-26 DIAGNOSIS — Z8739 Personal history of other diseases of the musculoskeletal system and connective tissue: Secondary | ICD-10-CM | POA: Diagnosis not present

## 2017-03-26 DIAGNOSIS — R5381 Other malaise: Secondary | ICD-10-CM | POA: Insufficient documentation

## 2017-03-26 DIAGNOSIS — Z981 Arthrodesis status: Secondary | ICD-10-CM | POA: Diagnosis not present

## 2017-03-26 HISTORY — DX: Epigastric pain: R10.13

## 2017-03-26 HISTORY — DX: Other fatigue: R53.83

## 2017-03-26 HISTORY — DX: Left upper quadrant pain: R10.12

## 2017-03-26 HISTORY — DX: Adjustment insomnia: F51.02

## 2017-03-26 HISTORY — DX: Personal history of other diseases of the musculoskeletal system and connective tissue: Z87.39

## 2017-03-26 HISTORY — DX: Other malaise: R53.81

## 2017-03-26 HISTORY — DX: Tension-type headache, unspecified, not intractable: G44.209

## 2017-03-26 LAB — CBC WITH DIFFERENTIAL/PLATELET
Basophils Absolute: 0.1 10*3/uL (ref 0.0–0.1)
Basophils Relative: 1.2 % (ref 0.0–3.0)
Eosinophils Absolute: 0.1 10*3/uL (ref 0.0–0.7)
Eosinophils Relative: 2.3 % (ref 0.0–5.0)
HCT: 38.1 % (ref 36.0–46.0)
Hemoglobin: 13.1 g/dL (ref 12.0–15.0)
Lymphocytes Relative: 31.1 % (ref 12.0–46.0)
Lymphs Abs: 1.5 10*3/uL (ref 0.7–4.0)
MCHC: 34.4 g/dL (ref 30.0–36.0)
MCV: 89.9 fl (ref 78.0–100.0)
Monocytes Absolute: 0.4 10*3/uL (ref 0.1–1.0)
Monocytes Relative: 8.4 % (ref 3.0–12.0)
Neutro Abs: 2.7 10*3/uL (ref 1.4–7.7)
Neutrophils Relative %: 57 % (ref 43.0–77.0)
Platelets: 284 10*3/uL (ref 150.0–400.0)
RBC: 4.23 Mil/uL (ref 3.87–5.11)
RDW: 12.6 % (ref 11.5–15.5)
WBC: 4.7 10*3/uL (ref 4.0–10.5)

## 2017-03-26 LAB — COMPREHENSIVE METABOLIC PANEL
ALT: 24 U/L (ref 0–35)
AST: 17 U/L (ref 0–37)
Albumin: 4.3 g/dL (ref 3.5–5.2)
Alkaline Phosphatase: 38 U/L — ABNORMAL LOW (ref 39–117)
BUN: 15 mg/dL (ref 6–23)
CO2: 27 mEq/L (ref 19–32)
Calcium: 9.5 mg/dL (ref 8.4–10.5)
Chloride: 103 mEq/L (ref 96–112)
Creatinine, Ser: 0.78 mg/dL (ref 0.40–1.20)
GFR: 86.82 mL/min (ref >60.00)
Glucose, Bld: 90 mg/dL (ref 70–99)
Potassium: 3.9 mEq/L (ref 3.5–5.1)
Sodium: 138 mEq/L (ref 135–145)
Total Bilirubin: 0.8 mg/dL (ref 0.2–1.2)
Total Protein: 6.9 g/dL (ref 6.0–8.3)

## 2017-03-26 LAB — LIPASE: Lipase: 23 U/L (ref 11.0–59.0)

## 2017-03-26 MED ORDER — FLUOXETINE HCL 20 MG PO TABS: 20.0000 mg | ORAL_TABLET | Freq: Every day | ORAL | 3 refills | Status: DC

## 2017-03-26 NOTE — Assessment & Plan Note (Signed)
Patient endorses feeling restless at night.  She wakes up multiple times throughout the night.

## 2017-03-26 NOTE — Assessment & Plan Note (Signed)
Previous EGD was negative.  She has been told to continue with omeprazole, which she does still use daily.  She is working on identifying triggers that cause exacerbation of her pain.

## 2017-03-26 NOTE — Assessment & Plan Note (Signed)
New.  This is becoming more of an issue over the past few months.  He describes the pain as coming from the base of her neck wrapping around her head.  She usually takes Motrin with relief.

## 2017-03-28 ENCOUNTER — Encounter: Payer: Self-pay | Admitting: Certified Nurse Midwife

## 2017-03-28 ENCOUNTER — Other Ambulatory Visit: Payer: Self-pay

## 2017-03-28 ENCOUNTER — Telehealth: Payer: Self-pay | Admitting: Family Medicine

## 2017-03-28 ENCOUNTER — Ambulatory Visit (INDEPENDENT_AMBULATORY_CARE_PROVIDER_SITE_OTHER): Payer: BLUE CROSS/BLUE SHIELD | Admitting: Certified Nurse Midwife

## 2017-03-28 ENCOUNTER — Encounter: Payer: Self-pay | Admitting: Family Medicine

## 2017-03-28 VITALS — BP 104/62 | HR 68 | Resp 16 | Ht 66.25 in | Wt 147.0 lb

## 2017-03-28 DIAGNOSIS — Z3041 Encounter for surveillance of contraceptive pills: Secondary | ICD-10-CM | POA: Diagnosis not present

## 2017-03-28 DIAGNOSIS — M2550 Pain in unspecified joint: Secondary | ICD-10-CM | POA: Insufficient documentation

## 2017-03-28 DIAGNOSIS — N898 Other specified noninflammatory disorders of vagina: Secondary | ICD-10-CM

## 2017-03-28 HISTORY — DX: Pain in unspecified joint: M25.50

## 2017-03-28 NOTE — Telephone Encounter (Signed)
See note

## 2017-03-28 NOTE — Patient Instructions (Signed)
Oral Contraception Use Oral contraceptive pills (OCPs) are medicines taken to prevent pregnancy. OCPs work by preventing the ovaries from releasing eggs. The hormones in OCPs also cause the cervical mucus to thicken, preventing the sperm from entering the uterus. The hormones also cause the uterine lining to become thin, not allowing a fertilized egg to attach to the inside of the uterus. OCPs are highly effective when taken exactly as prescribed. However, OCPs do not prevent sexually transmitted diseases (STDs). Safe sex practices, such as using condoms along with an OCP, can help prevent STDs. Before taking OCPs, you may have a physical exam and Pap test. Your health care provider may also order blood tests if necessary. Your health care provider will make sure you are a good candidate for oral contraception. Discuss with your health care provider the possible side effects of the OCP you may be prescribed. When starting an OCP, it can take 2 to 3 months for the body to adjust to the changes in hormone levels in your body. How to take oral contraceptive pills Your health care provider may advise you on how to start taking the first cycle of OCPs. Otherwise, you can:  Start on day 1 of your menstrual period. You will not need any backup contraceptive protection with this start time.  Start on the first Sunday after your menstrual period or the day you get your prescription. In these cases, you will need to use backup contraceptive protection for the first week.  Start the pill at any time of your cycle. If you take the pill within 5 days of the start of your period, you are protected against pregnancy right away. In this case, you will not need a backup form of birth control. If you start at any other time of your menstrual cycle, you will need to use another form of birth control for 7 days. If your OCP is the type called a minipill, it will protect you from pregnancy after taking it for 2 days (48  hours).  After you have started taking OCPs:  If you forget to take 1 pill, take it as soon as you remember. Take the next pill at the regular time.  If you miss 2 or more pills, call your health care provider because different pills have different instructions for missed doses. Use backup birth control until your next menstrual period starts.  If you use a 28-day pack that contains inactive pills and you miss 1 of the last 7 pills (pills with no hormones), it will not matter. Throw away the rest of the non-hormone pills and start a new pill pack.  No matter which day you start the OCP, you will always start a new pack on that same day of the week. Have an extra pack of OCPs and a backup contraceptive method available in case you miss some pills or lose your OCP pack. Follow these instructions at home:  Do not smoke.  Always use a condom to protect against STDs. OCPs do not protect against STDs.  Use a calendar to mark your menstrual period days.  Read the information and directions that came with your OCP. Talk to your health care provider if you have questions. Contact a health care provider if:  You develop nausea and vomiting.  You have abnormal vaginal discharge or bleeding.  You develop a rash.  You miss your menstrual period.  You are losing your hair.  You need treatment for mood swings or depression.  You  get dizzy when taking the OCP.  You develop acne from taking the OCP.  You become pregnant. Get help right away if:  You develop chest pain.  You develop shortness of breath.  You have an uncontrolled or severe headache.  You develop numbness or slurred speech.  You develop visual problems.  You develop pain, redness, and swelling in the legs. This information is not intended to replace advice given to you by your health care provider. Make sure you discuss any questions you have with your health care provider. Document Released: 12/22/2010 Document  Revised: 06/10/2015 Document Reviewed: 06/23/2012 Elsevier Interactive Patient Education  2017 [employer].

## 2017-03-28 NOTE — Progress Notes (Signed)
Patient ID: [name], female   DOB: 1975/05/10, 42 y.o.   MRN: [id number]  Chief Complaint  Patient presents with  . btb    btb on ocps//jj    HPI Alison Fleming is a 42 y.o. female.  Patient here with complaint of BTB 4 days prior to onset of period and last with light amount with wiping did not pad or tampon. Lasted very short while. Denies any vaginal symptoms or recent sexual activity. Was on the second to last pill of pack. This has occurred previously, just slight more blood noted. Occurs same time each month for the past 2-3 months. No other health issues today.      Past Medical History:  Diagnosis Date  . Anxiety   . Positive Lyme disease serology   . Seasonal allergies     Past Surgical History:  Procedure Laterality Date  . DILATATION & CURRETTAGE/HYSTEROSCOPY WITH RESECTOCOPE  02/12/2012   Procedure: [place];  Surgeon: Azalia Bilis, MD;  Location: [facility];  Service: Gynecology;  Laterality: N/A;  . DILATION AND CURETTAGE OF UTERUS  2010  . DILATION AND CURETTAGE OF UTERUS  2012  . SPINAL FUSION  2008    Family History  Problem Relation Age of Onset  . Diabetes Maternal Grandmother   . Hypertension Maternal Grandmother   . Heart disease Maternal Grandmother   . Heart attack Maternal Grandmother   . Osteoporosis Maternal Grandmother   . Diabetes Maternal Grandfather   . Hypertension Maternal Grandfather   . Heart disease Maternal Grandfather   . Heart attack Maternal Grandfather   . Hypertension Mother   . Anxiety disorder Mother   . Diabetes Mother     Social History Social History   Tobacco Use  . Smoking status: Never Smoker  . Smokeless tobacco: Never Used  Substance Use Topics  . Alcohol use: Yes    Alcohol/week: 0.6 - 1.2 oz    Types: 1 - 2 Standard drinks or equivalent per week  . Drug use: No    No Known Allergies  Current Outpatient Medications  Medication Sig Dispense Refill  .  cholecalciferol (VITAMIN D) 1000 units tablet Take 1,000 Units by mouth daily.    [name] CRANBERRY PO Take by mouth every other day.    . cyanocobalamin 100 MCG tablet Take 100 mcg by mouth every other day.     . Norethindrone Acetate-Ethinyl Estradiol (MICROGESTIN) 1.5-30 MG-MCG tablet Take 1 tablet by mouth daily. 3 Package 4  . omeprazole (PRILOSEC) 40 MG capsule Take 1 capsule (40 mg total) by mouth daily. 30 capsule 11  . Prenatal Vit-Fe Fumarate-FA (MULTIVITAMIN-PRENATAL) 27-0.8 MG TABS Take 1 tablet by mouth daily.    . RaNITidine HCl (ZANTAC PO) Take by mouth as needed.     [name] FLUoxetine (PROZAC) 20 MG tablet Take 1 tablet (20 mg total) by mouth daily. (Patient not taking: Reported on 03/28/2017) 30 tablet 3   No current facility-administered medications for this visit.     Review of Systems Review of Systems  Constitutional: Negative.   Gastrointestinal: Negative for abdominal pain.  Genitourinary: Positive for vaginal bleeding. Negative for vaginal discharge and vaginal pain.       Blood with wiping only 3 days prior to period  Skin: Negative.     Blood pressure 104/62, pulse 68, resp. rate 16, height 5' 6.25" (1.683 m), weight 147 lb (66.7 kg), last menstrual period 03/20/2017.  Physical Exam Physical Exam  Constitutional: She appears well-developed  and well-nourished.  Abdominal: Soft. She exhibits no mass. There is no tenderness. There is no rebound and no guarding.  Genitourinary: Uterus normal. There is no rash, tenderness, lesion or injury on the right labia. There is no rash, tenderness, lesion or injury on the left labia. Uterus is not enlarged and not tender. Cervix exhibits no motion tenderness, no discharge and no friability. Right adnexum displays no mass, no tenderness and no fullness. Left adnexum displays no mass, no tenderness and no fullness. No erythema, tenderness or bleeding in the vagina. Vaginal discharge found.  Genitourinary Comments: Scant clear vaginal  discharge with slight odor, affirm taken. No blood noted  Lymphadenopathy:       Right: No inguinal adenopathy present.       Left: No inguinal adenopathy present.  Neurological: She is alert. She has normal reflexes.  Skin: Skin is warm and dry.  Psychiatric: She has a normal mood and affect. Her behavior is normal. Judgment and thought content normal.     Assessment    Normal pelvic exam Contraception OCP with occasional BTB, taking pills correctly R/O vaginal infection  Plan: Discussed normal pelvic exam finding and not unusual to have occasional blood with last 2-3 pills, if taken correctly. Discussed this has happened before and pills were changed, with occasional  Re-occurrence again. Stressed no concerning finding. Discussed if occurs again to just stop OCP and go ahead and have menses as an option to see if this will stop reoccurrence or continue pills and aware this is normal for her. Patient will consider, but will call first and advise. Questions addressed. Lab: affirm Will treat if indicated.   Rv prn

## 2017-03-28 NOTE — Telephone Encounter (Signed)
Copied from [place]. Topic: General - Other >> Mar 28, 2017  1:11 PM Yvette Rack wrote: Reason for CRM: patient calling for lab results

## 2017-03-29 LAB — VAGINITIS/VAGINOSIS, DNA PROBE
Candida Species: NEGATIVE
Gardnerella vaginalis: NEGATIVE
Trichomonas vaginosis: NEGATIVE

## 2017-03-29 NOTE — Telephone Encounter (Signed)
Called patient she received results she wanted on my chart last night. Will call if any further questions.

## 2017-04-04 ENCOUNTER — Other Ambulatory Visit: Payer: Self-pay | Admitting: Obstetrics and Gynecology

## 2017-04-13 ENCOUNTER — Ambulatory Visit: Payer: Self-pay | Admitting: *Deleted

## 2017-04-13 NOTE — Telephone Encounter (Signed)
Stop the prozac. Let's meet next week.

## 2017-04-13 NOTE — Telephone Encounter (Signed)
Patient informed and follow up made. Will call or get evaluated if any changes in symptoms or increase. Will let us know if any more questions.

## 2017-04-13 NOTE — Telephone Encounter (Signed)
Called in c/o having adverse effects that started when she started her new Prozac medication 2 weeks ago.    I have routed a high priority note to Dr. Juleen China making her aware of the situation.   Reason for Disposition . Caller has URGENT medication question about med that PCP prescribed and triager unable to answer question  Answer Assessment - Initial Assessment Questions 1. SYMPTOMS: "Do you have any symptoms?"     Started Prozac almost 2 weeks ago by Dr. Juleen China.   I'm having dizziness, racing heart, trouble sleeping, like a tight muscles in my chest  and very tired.      2. SEVERITY: If symptoms are present, ask "Are they mild, moderate or severe?"     Pretty severe.    Should I stop the Prozac?  Protocols used: MEDICATION QUESTION CALL-A-AH

## 2017-04-13 NOTE — Telephone Encounter (Signed)
Please review

## 2017-04-18 ENCOUNTER — Encounter: Payer: Self-pay | Admitting: Family Medicine

## 2017-04-18 ENCOUNTER — Ambulatory Visit (INDEPENDENT_AMBULATORY_CARE_PROVIDER_SITE_OTHER): Payer: BLUE CROSS/BLUE SHIELD | Admitting: Family Medicine

## 2017-04-18 VITALS — BP 106/64 | HR 75 | Temp 98.0°F | Ht 66.25 in | Wt 145.4 lb

## 2017-04-18 DIAGNOSIS — F411 Generalized anxiety disorder: Secondary | ICD-10-CM | POA: Diagnosis not present

## 2017-04-18 DIAGNOSIS — Z981 Arthrodesis status: Secondary | ICD-10-CM

## 2017-04-18 DIAGNOSIS — R0789 Other chest pain: Secondary | ICD-10-CM | POA: Diagnosis not present

## 2017-04-18 DIAGNOSIS — Z8739 Personal history of other diseases of the musculoskeletal system and connective tissue: Secondary | ICD-10-CM

## 2017-04-18 DIAGNOSIS — R209 Unspecified disturbances of skin sensation: Secondary | ICD-10-CM

## 2017-04-18 DIAGNOSIS — F5102 Adjustment insomnia: Secondary | ICD-10-CM | POA: Diagnosis not present

## 2017-04-18 DIAGNOSIS — IMO0001 Reserved for inherently not codable concepts without codable children: Secondary | ICD-10-CM

## 2017-04-18 MED ORDER — BUPROPION HCL ER (XL) 150 MG PO TB24: 150.0000 mg | ORAL_TABLET | Freq: Every day | ORAL | 3 refills | Status: DC

## 2017-04-18 MED ORDER — PROPRANOLOL HCL 10 MG PO TABS: 10.0000 mg | ORAL_TABLET | Freq: Three times a day (TID) | ORAL | 0 refills | Status: DC | PRN

## 2017-04-18 NOTE — Progress Notes (Signed)
Alison Fleming is a 42 y.o. female is here for follow up.  History of Present Illness:   [name] CMA acting as scribe for Dr. Juleen China.  HPI: Patient comes in today for reaction that she was having with her medication. She started taking the Prozac about 2 weeks ago. She was feeling a lot of fatigue with the medication. She then started developing chest pressure and palpitations as well. She stopped take the medication on Friday and her symptoms have stopped.   Patient states that she took Wellbutrin before her last pregnancy with helpful effects and no side effects.  She is interested in restarting this medication.  Epigastric pain has improved somewhat.  Unfortunately, her chest pain persists and is worse in the morning.  She also continues to have paresthesias of both arms from the elbow to the hands.  She does at times feel weakness in her legs.  She continues to have intermittent tension headaches.  She states multiple times during her visit that she understands that this may be due to anxiety, but she would like to make sure that every other issue is ruled out prior to focusing on her anxiety.  There are no preventive care reminders to display for this patient.   Depression screen PHQ 2/9 10/12/2016  Decreased Interest 0  Down, Depressed, Hopeless 0  PHQ - 2 Score 0   PMHx, SurgHx, SocialHx, FamHx, Medications, and Allergies were reviewed in the Visit Navigator and updated as appropriate.   Patient Active Problem List   Diagnosis Date Noted  . Polyarthralgia 03/28/2017  . Dyspepsia, s/p normal EGD 03/08/17, with recommendation for lifetime anti-reflux regimen 03/26/2017  . History of spinal fusion for scoliosis 03/26/2017  . Malaise and fatigue 03/26/2017  . Left upper quadrant pain 03/26/2017  . Tension headache 03/26/2017  . 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 GAD (generalized anxiety disorder) 05/10/2016   Social History   Tobacco Use  . Smoking status: Never Smoker   . Smokeless tobacco: Never Used  Substance Use Topics  . Alcohol use: Yes    Alcohol/week: 0.6 - 1.2 oz    Types: 1 - 2 Standard drinks or equivalent per week  . Drug use: No   Current Medications and Allergies:   .  cholecalciferol (VITAMIN D) 1000 units tablet, Take 1,000 Units by mouth daily., Disp: , Rfl:  .  CRANBERRY PO, Take by mouth every other day., Disp: , Rfl:  .  cyanocobalamin 100 MCG tablet, Take 100 mcg by mouth every other day. , Disp: , Rfl:  .  Norethindrone Acetate-Ethinyl Estradiol (MICROGESTIN) 1.5-30 MG-MCG tablet, Take 1 tablet by mouth daily., Disp: 3 Package, Rfl: 4 .  omeprazole (PRILOSEC) 40 MG capsule, Take 1 capsule (40 mg total) by mouth daily., Disp: 30 capsule, Rfl: 11 .  Prenatal Vit-Fe Fumarate-FA (MULTIVITAMIN-PRENATAL) 27-0.8 MG TABS, Take 1 tablet by mouth daily., Disp: , Rfl:  .  RaNITidine HCl (ZANTAC PO), Take by mouth as needed. , Disp: , Rfl:   Allergies  Allergen Reactions  . Zoloft [Sertraline Hcl] Other (See Comments)    Sedation   Review of Systems   Pertinent items are noted in the HPI. Otherwise, ROS is negative.  Vitals:   Vitals:   04/18/17 0735  BP: 106/64  Pulse: 75  Temp: 98 F (36.7 C)  TempSrc: Oral  SpO2: 98%  Weight: 145 lb 6.4 oz (66 kg)  Height: 5' 6.25" (1.683 m)     Body mass index is 23.29 kg/m.  Physical Exam:   Physical Exam  Constitutional: She appears well-nourished.  HENT:  Head: Normocephalic and atraumatic.  Eyes: Pupils are equal, round, and reactive to light. EOM are normal.  Neck: Normal range of motion. Neck supple.  Cardiovascular: Normal rate, regular rhythm, normal heart sounds and intact distal pulses.  Pulmonary/Chest: Effort normal.  Abdominal: Soft.  Skin: Skin is warm.  Psychiatric: She has a normal mood and affect. Her behavior is normal.  Nursing note and vitals reviewed.   Assessment and Plan:   Alison Fleming was seen today for follow-up.  Diagnoses and all orders for this  visit:  GAD (generalized anxiety disorder) Comments: Okay to trial Wellbutrin.  I did let her know that this is also an activating medication.  We will also trial propranolol as needed for panic. Orders: -     propranolol (INDERAL) 10 MG tablet; Take 1 tablet (10 mg total) by mouth 3 (three) times daily as needed. -     buPROPion (WELLBUTRIN XL) 150 MG 24 hr tablet; Take 1 tablet (150 mg total) by mouth daily. Start 150 mg ER PO qam, increase after 3 days to 150 mg ER PO BID.  Atypical chest pain Comments: Ongoing.  Unlikely cardiac in nature but will have cardiology evaluate based on her high risk family history. Orders: -     Ambulatory referral to Cardiology  Paresthesias/numbness Comments: I would like for the patient to be evaluated by neurology at least once to see if there is something that I may be missing.  We have considered MS in the differential.  We have also discussed myofascial pain syndromes. Orders: -     Ambulatory referral to Neurology  Adjustment insomnia Comments: She continues to have insomnia most nights.  She feels daytime fatigue and is feeling guilty because she is sitting during the afternoon when her children want to play. Orders: -     Ambulatory referral to Neurology  History of spinal fusion for scoliosis Comments: Possibly contributing to her about issues. Orders: -     Ambulatory referral to Neurology   . Reviewed expectations re: course of current medical issues. . Discussed self-management of symptoms. . Outlined signs and symptoms indicating need for more acute intervention. . Patient verbalized understanding and all questions were answered. [name] Health Maintenance issues including appropriate healthy diet, exercise, and smoking avoidance were discussed with patient. . See orders for this visit as documented in the electronic medical record. . Patient received an After Visit Summary.  [name], DO [place] 04/18/2017  Future Appointments  Date Time Provider [place]  05/18/2017  7:00 AM [name], DO [facility]  03/12/2018  9:30 AM Regina Eck, CNM [place] None

## 2017-04-24 DIAGNOSIS — R0789 Other chest pain: Secondary | ICD-10-CM

## 2017-04-24 DIAGNOSIS — IMO0001 Reserved for inherently not codable concepts without codable children: Secondary | ICD-10-CM | POA: Insufficient documentation

## 2017-04-24 DIAGNOSIS — R079 Chest pain, unspecified: Secondary | ICD-10-CM | POA: Insufficient documentation

## 2017-04-24 DIAGNOSIS — R209 Unspecified disturbances of skin sensation: Secondary | ICD-10-CM

## 2017-04-24 HISTORY — DX: Reserved for inherently not codable concepts without codable children: IMO0001

## 2017-04-24 HISTORY — DX: Other chest pain: R07.89

## 2017-04-25 ENCOUNTER — Ambulatory Visit: Payer: BLUE CROSS/BLUE SHIELD | Admitting: Family Medicine

## 2017-04-27 ENCOUNTER — Ambulatory Visit: Payer: BLUE CROSS/BLUE SHIELD | Admitting: Family Medicine

## 2017-04-29 NOTE — Progress Notes (Signed)
Cardiology Office Note:    Date:  04/30/2017   ID:  Alison Fleming, DOB [birth year], MRN [id number]  PCP:  [name], DO  Cardiologist:  Shirlee More, MD   Referring MD: [name], DO  ASSESSMENT:    1. Chest pain in adult   2. Palpitation    PLAN:    In order of problems listed above:  1. Her symptoms are suggestive of musculoskeletal etiology consistent with her scoliosis and surgical intervention.  For reassurance echocardiogram be performed and have asked her to start taking over-the-counter nonsteroidal anti-inflammatory drugs daily.  Physical therapy modalities may be helpful if symptoms persist.  I do not think she needs an ischemia evaluation 2. Holter monitor 48 hours.  Next appointment 4 weeks   Medication Adjustments/Labs and Tests Ordered: Current medicines are reviewed at length with the patient today.  Concerns regarding medicines are outlined above.  Orders Placed This Encounter  Procedures  . HOLTER MONITOR - 48 HOUR  . EKG 12-Lead  . ECHOCARDIOGRAM COMPLETE   Meds ordered this encounter  Medications  . naproxen sodium (ALEVE) 220 MG tablet    Sig: Take 1 tablet (220 mg total) by mouth daily as needed.     Chief Complaint  Patient presents with  . New Patient (Initial Visit)    to evaluate 'chest tightness"     History of Present Illness:    Alison Fleming is a 42 y.o. female with scoliosis and surgery 2008 who is being seen today for the evaluation of chest pain at the request of [name], DO.  She has experienced chest discomfort intermittently for over a year.  She tells me in general she does not feel well and has had problems with anxiety.  She describes fleeting discomfort through the left chest into her brought back sharp nonexertional unrelated to activity.  No shortness of breath she also has palpitation with and without chest discomfort describes a sensation of irregular heartbeat and awareness of her heart beating when she  does physical activity.  She has no underlying heart disease congenital or rheumatic.  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  Family history is noteworthy for early CAD but no history of cardiomyopathy or sudden cardiac death.  She has no risk factors other for heart disease CAD Past Medical History:  Diagnosis Date  . 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 Anxiety   . Atypical chest pain 04/24/2017  . Dyspepsia, s/p normal EGD 03/08/17, with recommendation for lifetime anti-reflux regimen 03/26/2017  . GAD (generalized anxiety disorder) 05/10/2016  . History of spinal fusion for scoliosis 03/26/2017  . Left upper quadrant pain 03/26/2017  . Malaise and fatigue 03/26/2017  . Paresthesias/numbness 04/24/2017  . Polyarthralgia 03/28/2017  . Positive Lyme disease serology   . Seasonal allergies   . Tension headache 03/26/2017    Past Surgical History:  Procedure Laterality Date  . DILATATION & CURRETTAGE/HYSTEROSCOPY WITH RESECTOCOPE  02/12/2012   Procedure: [place];  Surgeon: Azalia Bilis, MD;  Location: [facility];  Service: Gynecology;  Laterality: N/A;  . DILATION AND CURETTAGE OF UTERUS  2010  . DILATION AND CURETTAGE OF UTERUS  2012  . SPINAL FUSION  2008    Current Medications: Current Meds  Medication Sig  . cholecalciferol (VITAMIN D) 1000 units tablet Take 1,000 Units by mouth daily.  [name] CRANBERRY PO Take by mouth every other day.  . cyanocobalamin 100 MCG  tablet Take 100 mcg by mouth every other day.   . Norethindrone Acetate-Ethinyl Estradiol (MICROGESTIN) 1.5-30 MG-MCG tablet Take 1 tablet by mouth daily.  [name] omeprazole (PRILOSEC) 40 MG capsule Take 1 capsule (40 mg total) by mouth daily.  . Prenatal Vit-Fe Fumarate-FA (MULTIVITAMIN-PRENATAL) 27-0.8 MG TABS Take 1 tablet by mouth daily.  . propranolol (INDERAL) 10 MG tablet Take 1 tablet (10 mg total) by mouth 3 (three) times  daily as needed.  . RaNITidine HCl (ZANTAC PO) Take by mouth as needed.      Allergies:   Zoloft [sertraline hcl]   Social History   Socioeconomic History  . Marital status: Married    Spouse name: Not on file  . Number of children: 2  . Years of education: Not on file  . Highest education level: Not on file  Occupational History  . Occupation: HUMAN RESOURCES    Employer: [employer]  Social Needs  . Financial resource strain: Not on file  . Food insecurity:    Worry: Not on file    Inability: Not on file  . Transportation needs:    Medical: Not on file    Non-medical: Not on file  Tobacco Use  . Smoking status: Never Smoker  . Smokeless tobacco: Never Used  Substance and Sexual Activity  . Alcohol use: Yes    Alcohol/week: 0.6 - 1.2 oz    Types: 1 - 2 Standard drinks or equivalent per week  . Drug use: No  . Sexual activity: Yes    Partners: Male    Birth control/protection: OCP  Lifestyle  . Physical activity:    Days per week: Not on file    Minutes per session: Not on file  . Stress: Not on file  Relationships  . Social connections:    Talks on phone: Not on file    Gets together: Not on file    Attends religious service: Not on file    Active member of club or organization: Not on file    Attends meetings of clubs or organizations: Not on file    Relationship status: Not on file  Other Topics Concern  . Not on file  Social History Narrative  . Not on file     Family History: The patient's family history includes Anxiety disorder in her mother; Diabetes in her maternal grandfather, maternal grandmother, and mother; Heart attack in her maternal grandfather and maternal grandmother; Heart disease in her maternal grandfather and maternal grandmother; Heart murmur in her brother; Hypertension in her maternal grandfather, maternal grandmother, and mother; Osteoporosis in her maternal grandmother.  ROS:   Review of Systems  Constitution:  Negative.  HENT: Negative.   Eyes: Negative.   Cardiovascular: Positive for chest pain and palpitations.  Respiratory: Negative.   Endocrine: Negative.   Hematologic/Lymphatic: Negative.   Skin: Negative.   Musculoskeletal: Positive for back pain and myalgias.  Gastrointestinal: Negative.   Genitourinary: Negative.   Neurological: Negative.   Psychiatric/Behavioral: The patient is nervous/anxious.   Allergic/Immunologic: Negative.    Please see the history of present illness.     All other systems reviewed and are negative.  EKGs/Labs/Other Studies Reviewed:    The following studies were reviewed today:   EKG:  EKG is  ordered today.  The ekg ordered today demonstrates sinus rhythm normal  Recent Labs: 07/25/2016: TSH 1.25 03/26/2017: ALT 24; BUN 15; Creatinine, Ser 0.78; Hemoglobin 13.1; Platelets 284.0; Potassium 3.9; Sodium 138  Recent Lipid Panel  Component Value Date/Time   CHOL 170 07/25/2016 1039   TRIG 112.0 07/25/2016 1039   HDL 60.80 07/25/2016 1039   CHOLHDL 3 07/25/2016 1039   VLDL 22.4 07/25/2016 1039   LDLCALC 86 07/25/2016 1039    Physical Exam:    VS:  BP 110/72 (BP Location: Left Arm, Patient Position: Sitting, Cuff Size: Normal)   Pulse 76   Ht 5' 6.25" (1.683 m)   Wt 148 lb 12.8 oz (67.5 kg)   SpO2 98%   BMI 23.84 kg/m     Wt Readings from Last 3 Encounters:  04/30/17 148 lb 12.8 oz (67.5 kg)  04/18/17 145 lb 6.4 oz (66 kg)  03/28/17 147 lb (66.7 kg)     GEN: mild scoliosis, pectus deformity  Well nourished, well developed in no acute distress HEENT: Normal NECK: No JVD; No carotid bruits LYMPHATICS: No lymphadenopathy CARDIAC: RRR, no murmurs, rubs, gallops RESPIRATORY:  Clear to auscultation without rales, wheezing or rhonchi  ABDOMEN: Soft, non-tender, non-distended MUSCULOSKELETAL:  No edema; No deformity  SKIN: Warm and dry NEUROLOGIC:  Alert and oriented x 3 PSYCHIATRIC:  Normal affect     Signed, Shirlee More, MD    04/30/2017 10:41 AM    [place]

## 2017-04-30 ENCOUNTER — Ambulatory Visit (INDEPENDENT_AMBULATORY_CARE_PROVIDER_SITE_OTHER): Payer: BLUE CROSS/BLUE SHIELD | Admitting: Cardiology

## 2017-04-30 ENCOUNTER — Encounter: Payer: Self-pay | Admitting: Cardiology

## 2017-04-30 VITALS — BP 110/72 | HR 76 | Ht 66.25 in | Wt 148.8 lb

## 2017-04-30 DIAGNOSIS — R079 Chest pain, unspecified: Secondary | ICD-10-CM

## 2017-04-30 DIAGNOSIS — R002 Palpitations: Secondary | ICD-10-CM | POA: Diagnosis not present

## 2017-04-30 MED ORDER — NAPROXEN SODIUM 220 MG PO TABS: 220.0000 mg | ORAL_TABLET | Freq: Every day | ORAL | Status: DC | PRN

## 2017-04-30 NOTE — Patient Instructions (Signed)
Medication Instructions:  Your physician has recommended you make the following change in your medication:  START Aleve once daily as needed.  Labwork: None  Testing/Procedures: You had an EKG today.  Your physician has requested that you have an echocardiogram. Echocardiography is a painless test that uses sound waves to create images of your heart. It provides your doctor with information about the size and shape of your heart and how well your heart's chambers and valves are working. This procedure takes approximately one hour. There are no restrictions for this procedure.  Your physician has recommended that you wear a holter monitor. Holter monitors are medical devices that record the heart's electrical activity. Doctors most often use these monitors to diagnose arrhythmias. Arrhythmias are problems with the speed or rhythm of the heartbeat. The monitor is a small, portable device. You can wear one while you do your normal daily activities. This is usually used to diagnose what is causing palpitations/syncope (passing out). 48 hours.  Follow-Up: Your physician recommends that you schedule a follow-up appointment in: 4 weeks.  Any Other Special Instructions Will Be Listed Below (If Applicable).     If you need a refill on your cardiac medications before your next appointment, please call your pharmacy.

## 2017-05-18 ENCOUNTER — Ambulatory Visit (INDEPENDENT_AMBULATORY_CARE_PROVIDER_SITE_OTHER): Payer: BLUE CROSS/BLUE SHIELD | Admitting: Family Medicine

## 2017-05-18 ENCOUNTER — Encounter: Payer: Self-pay | Admitting: Family Medicine

## 2017-05-18 VITALS — BP 108/74 | HR 77 | Temp 97.6°F | Ht 66.25 in | Wt 145.6 lb

## 2017-05-18 DIAGNOSIS — R1013 Epigastric pain: Secondary | ICD-10-CM | POA: Diagnosis not present

## 2017-05-18 DIAGNOSIS — F411 Generalized anxiety disorder: Secondary | ICD-10-CM

## 2017-05-18 DIAGNOSIS — M5410 Radiculopathy, site unspecified: Secondary | ICD-10-CM | POA: Diagnosis not present

## 2017-05-18 DIAGNOSIS — B001 Herpesviral vesicular dermatitis: Secondary | ICD-10-CM

## 2017-05-18 MED ORDER — OMEPRAZOLE 20 MG PO CPDR: 20.0000 mg | DELAYED_RELEASE_CAPSULE | Freq: Every day | ORAL | 3 refills | Status: DC

## 2017-05-18 MED ORDER — VALACYCLOVIR HCL 1 G PO TABS: 1000.0000 mg | ORAL_TABLET | Freq: Two times a day (BID) | ORAL | 0 refills | Status: DC

## 2017-05-18 NOTE — Progress Notes (Signed)
Alison Fleming is a 42 y.o. female is here for follow up.  History of Present Illness:   Alison Fleming, Alison Fleming acting as scribe for Dr. [name].   HPI: Patient in office for follow up. Felling some better from last visit. She did not start wellbutin yet she was traveling and has history of bad reactions to medications. She has follow up with cardiology next week for Holter and echo. Cardiology stated that they believed her was coming from her back.  She has had 2 episodes of panic attacks over the past month.  She was able to breathe through these.  She has not tried the propranolol.  She has an appointment with Dr. Felecia Shelling in June.  Interested in going down to omeprazole 20 mg daily.  Still having pain in bilateral elbows with radiation to her hands.  This is worse at night when she is trying to go to sleep but improved by holding a pillow.  History of scoliosis status post.  Has been told that she may have an issue with the screw in her upper back.  There are no preventive care reminders to display for this patient. Depression screen [facility] 2/9 05/18/2017 10/12/2016  Decreased Interest 1 0  Down, Depressed, Hopeless 0 0  PHQ - 2 Score 1 0  Altered sleeping 2 -  Tired, decreased energy 2 -  Change in appetite 3 -  Feeling bad or failure about yourself  0 -  Trouble concentrating 3 -  Moving slowly or fidgety/restless 0 -  Suicidal thoughts 0 -  PHQ-9 Score 11 -  Difficult doing work/chores Somewhat difficult -   PMHx, SurgHx, SocialHx, FamHx, Medications, and Allergies were reviewed in the Visit Navigator and updated as appropriate.   Patient Active Problem List   Diagnosis Date Noted  . Palpitation 04/30/2017  . Chest pain in adult 04/24/2017  . Paresthesias/numbness 04/24/2017  . Polyarthralgia 03/28/2017  . Dyspepsia, s/p normal EGD 03/08/17, with recommendation for lifetime anti-reflux regimen 03/26/2017  . History of spinal fusion for scoliosis 03/26/2017  . Malaise  and fatigue 03/26/2017  . Left upper quadrant pain 03/26/2017  . Tension headache 03/26/2017  . 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 GAD (generalized anxiety disorder) 05/10/2016   Social History   Tobacco Use  . Smoking status: Never Smoker  . Smokeless tobacco: Never Used  Substance Use Topics  . Alcohol use: Yes    Alcohol/week: 0.6 - 1.2 oz    Types: 1 - 2 Standard drinks or equivalent per week  . Drug use: No   Current Medications and Allergies:   .  buPROPion (WELLBUTRIN XL) 150 MG 24 hr tablet, Take 1 tablet (150 mg total) by mouth daily. Start 150 mg ER PO qam, increase after 3 days to 150 mg ER PO BID. (Patient not taking: Reported on 04/30/2017), Disp: 30 tablet, Rfl: 3 .  cholecalciferol (VITAMIN D) 1000 units tablet, Take 1,000 Units by mouth daily., Disp: , Rfl:  .  CRANBERRY PO, Take by mouth every other day., Disp: , Rfl:  .  cyanocobalamin 100 MCG tablet, Take 100 mcg by mouth every other day. , Disp: , Rfl:  .  naproxen sodium (ALEVE) 220 MG tablet, Take 1 tablet (220 mg total) by mouth daily as needed., Disp: , Rfl:  .  Norethindrone Acetate-Ethinyl Estradiol (MICROGESTIN) 1.5-30 MG-MCG tablet, Take 1 tablet by mouth daily., Disp: 3 Package, Rfl: 4 .  omeprazole (PRILOSEC) 40 MG capsule, Take 1 capsule (40 mg total) by  mouth daily., Disp: 30 capsule, Rfl: 11 .  Prenatal Vit-Fe Fumarate-FA (MULTIVITAMIN-PRENATAL) 27-0.8 MG TABS, Take 1 tablet by mouth daily., Disp: , Rfl:  .  propranolol (INDERAL) 10 MG tablet, Take 1 tablet (10 mg total) by mouth 3 (three) times daily as needed., Disp: 30 tablet, Rfl: 0 .  RaNITidine HCl (ZANTAC PO), Take by mouth as needed. , Disp: , Rfl:    Allergies  Allergen Reactions  . Zoloft [Sertraline Hcl] Other (See Comments)    Sedation   Review of Systems   Pertinent items are noted in the HPI. Otherwise, ROS is negative.  Vitals:   Vitals:   05/18/17 0707  BP: 108/74  Pulse: 77  Temp: 97.6 F (36.4 C)  TempSrc: Oral  SpO2:  97%  Weight: 145 lb 9.6 oz (66 kg)  Height: 5' 6.25" (1.683 m)     Body mass index is 23.32 kg/m.   Physical Exam:   Physical Exam  Constitutional: She appears well-developed and well-nourished. No distress.  HENT:  Head: Normocephalic and atraumatic.  Eyes: Pupils are equal, round, and reactive to light. EOM are normal.  Neck: Normal range of motion. Neck supple.  Cardiovascular: Normal rate, regular rhythm, normal heart sounds and intact distal pulses.  Pulmonary/Chest: Effort normal.  Abdominal: Soft.  Skin: Skin is warm.  Psychiatric: She has a normal mood and affect. Her behavior is normal.  Nursing note and vitals reviewed.   Assessment and Plan:   Alison Fleming was seen today for follow-up.  Diagnoses and all orders for this visit:  GAD (generalized anxiety disorder) Comments: She is still interested in trying the Wellbutrin.  We will go ahead and start when home.  Recurrent cold sores Comments: Patient asked for refill of Valtrex for as needed cold sores.  None today. Orders: -     valACYclovir (VALTREX) 1000 MG tablet; Take 1 tablet (1,000 mg total) by mouth 2 (two) times daily.  Dyspepsia, s/p normal EGD 03/08/17, with recommendation for lifetime anti-reflux regimen Comments: Okay to decrease to omeprazole 20 mg p.o. daily.  She understands to go back to 40 mg p.o. daily if symptoms worsen. Orders: -     omeprazole (PRILOSEC) 20 MG capsule; Take 1 capsule (20 mg total) by mouth daily.  Radicular pain of upper extremity Comments: Likely from neck.  Will ask neurology to weigh in.    . Reviewed expectations re: course of current medical issues. . Discussed self-management of symptoms. . Outlined signs and symptoms indicating need for more acute intervention. . Patient verbalized understanding and all questions were answered. [name] Health Maintenance issues including appropriate healthy diet, exercise, and smoking avoidance were discussed with patient. . See orders  for this visit as documented in the electronic medical record. . Patient received an After Visit Summary.  [name], DO , [facility] [place] [facility] 05/18/2017  Future Appointments  Date Time Provider Force  05/24/2017 10:15 AM [facility] 1 [facility]  05/24/2017 11:30 AM [facility] None  05/30/2017  9:40 AM Bettina Gavia, Hilton Cork, MD [facility] None  07/02/2017 10:30 AM Sater, Nanine Means, MD [facility] None  08/21/2017  7:20 AM [name], DO [facility]  03/12/2018  9:30 AM Regina Eck, CNM [place] None

## 2017-05-24 ENCOUNTER — Ambulatory Visit: Payer: BLUE CROSS/BLUE SHIELD

## 2017-05-24 ENCOUNTER — Ambulatory Visit (HOSPITAL_BASED_OUTPATIENT_CLINIC_OR_DEPARTMENT_OTHER)
Admission: RE | Admit: 2017-05-24 | Discharge: 2017-05-24 | Disposition: A | Discharge status: Home / Self Care | Payer: BLUE CROSS/BLUE SHIELD | Source: Ambulatory Visit | Attending: Cardiology | Admitting: Cardiology

## 2017-05-24 DIAGNOSIS — R079 Chest pain, unspecified: Secondary | ICD-10-CM

## 2017-05-24 DIAGNOSIS — R0600 Dyspnea, unspecified: Secondary | ICD-10-CM | POA: Diagnosis not present

## 2017-05-24 DIAGNOSIS — R002 Palpitations: Secondary | ICD-10-CM

## 2017-05-24 NOTE — Progress Notes (Signed)
Echocardiogram 2D Echocardiogram has been performed.  [name] 05/24/2017, 10:46 AM

## 2017-05-29 NOTE — Progress Notes (Signed)
Cardiology Office Note:    Date:  05/30/2017   ID:  Alison Fleming, DOB [birth year], MRN [id number]  PCP:  [name], DO  Cardiologist:  Shirlee More, MD    Referring MD: [name], DO    ASSESSMENT:    1. Costochondritis   2. Palpitation    PLAN:    In order of problems listed above:  1. Improved with over-the-counter nonsteroidal anti-inflammatory drugs she is reassured with a normal echocardiogram at this time I do not think she needs physical therapy modalities certainly is an option if symptoms flare in the future can take nonsteroidal over-the-counter as needed 2. Await Holter monitor if she has significant arrhythmia I will bring her back to my office otherwise reassurance   Next appointment: As needed   Medication Adjustments/Labs and Tests Ordered: Current medicines are reviewed at length with the patient today.  Concerns regarding medicines are outlined above.  No orders of the defined types were placed in this encounter.  No orders of the defined types were placed in this encounter.   Chief Complaint  Patient presents with  . Follow-up    after echo     History of Present Illness:    Alison Fleming is a 42 y.o. female with a hx of  chest pain andscoliosis and surgery 2008 last seen 04/30/17.  ASSESSMENT:    04/30/17   1. Chest pain in adult   2. Palpitation    PLAN:    1.   Her symptoms are suggestive of musculoskeletal etiology consistent with her scoliosis and surgical intervention.  For reassurance echocardiogram be performed and have asked her to start taking over-the-counter nonsteroidal anti-inflammatory drugs daily.  Physical therapy modalities may be helpful if symptoms persist.  I do not think she needs an ischemia evaluation 3. Holter monitor 48 hours.  Compliance with diet, lifestyle and medications: Yes  She is improved with ibuprofen still has mild CCTA tenderness and will take in the future as needed.  She has had some  palpitation wearing the Holter monitor returned at this morning and I told her we will call her with report and unless she has significant arrhythmia at this time I did offer reassurance and avoiding over-the-counter proarrhythmic drugs given a list.  If symptoms flare of costochondritis physical therapy would be appropriate Past Medical History:  Diagnosis Date  . 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 Anxiety   . Atypical chest pain 04/24/2017  . Dyspepsia, s/p normal EGD 03/08/17, with recommendation for lifetime anti-reflux regimen 03/26/2017  . GAD (generalized anxiety disorder) 05/10/2016  . History of spinal fusion for scoliosis 03/26/2017  . Left upper quadrant pain 03/26/2017  . Malaise and fatigue 03/26/2017  . Paresthesias/numbness 04/24/2017  . Polyarthralgia 03/28/2017  . Positive Lyme disease serology   . Seasonal allergies   . Tension headache 03/26/2017    Past Surgical History:  Procedure Laterality Date  . DILATATION & CURRETTAGE/HYSTEROSCOPY WITH RESECTOCOPE  02/12/2012   Procedure: [place];  Surgeon: Azalia Bilis, MD;  Location: [facility];  Service: Gynecology;  Laterality: N/A;  . DILATION AND CURETTAGE OF UTERUS  2010  . DILATION AND CURETTAGE OF UTERUS  2012  . SPINAL FUSION  2008    Current Medications: Current Meds  Medication Sig  . cholecalciferol (VITAMIN D) 1000 units tablet Take 1,000 Units by mouth daily.  [name] CRANBERRY PO Take by mouth every other day.  . cyanocobalamin 100 MCG tablet Take 100 mcg by mouth every other  day.   . naproxen sodium (ALEVE) 220 MG tablet Take 1 tablet (220 mg total) by mouth daily as needed. (Patient taking differently: Take 220 mg by mouth daily. )  . Norethindrone Acetate-Ethinyl Estradiol (MICROGESTIN) 1.5-30 MG-MCG tablet Take 1 tablet by mouth daily.  [name] omeprazole (PRILOSEC) 20 MG capsule Take 1 capsule (20 mg total) by mouth daily.  . Prenatal Vit-Fe Fumarate-FA (MULTIVITAMIN-PRENATAL) 27-0.8  MG TABS Take 1 tablet by mouth daily.  . propranolol (INDERAL) 10 MG tablet Take 1 tablet (10 mg total) by mouth 3 (three) times daily as needed.  . RaNITidine HCl (ZANTAC PO) Take by mouth as needed.      Allergies:   Zoloft [sertraline hcl]   Social History   Socioeconomic History  . Marital status: Married    Spouse name: Not on file  . Number of children: 2  . Years of education: Not on file  . Highest education level: Not on file  Occupational History  . Occupation: HUMAN RESOURCES    Employer: [employer]  Social Needs  . Financial resource strain: Not on file  . Food insecurity:    Worry: Not on file    Inability: Not on file  . Transportation needs:    Medical: Not on file    Non-medical: Not on file  Tobacco Use  . Smoking status: Never Smoker  . Smokeless tobacco: Never Used  Substance and Sexual Activity  . Alcohol use: Yes    Alcohol/week: 0.6 - 1.2 oz    Types: 1 - 2 Standard drinks or equivalent per week  . Drug use: No  . Sexual activity: Yes    Partners: Male    Birth control/protection: OCP  Lifestyle  . Physical activity:    Days per week: Not on file    Minutes per session: Not on file  . Stress: Not on file  Relationships  . Social connections:    Talks on phone: Not on file    Gets together: Not on file    Attends religious service: Not on file    Active member of club or organization: Not on file    Attends meetings of clubs or organizations: Not on file    Relationship status: Not on file  Other Topics Concern  . Not on file  Social History Narrative  . Not on file     Family History: The patient's family history includes Anxiety disorder in her mother; Diabetes in her maternal grandfather, maternal grandmother, and mother; Heart attack in her maternal grandfather and maternal grandmother; Heart disease in her maternal grandfather and maternal grandmother; Heart murmur in her brother; Hypertension in her maternal  grandfather, maternal grandmother, and mother; Osteoporosis in her maternal grandmother. ROS:   Please see the history of present illness.    All other systems reviewed and are negative.  EKGs/Labs/Other Studies Reviewed:    The following studies were reviewed today:   Echo 05/24/17: - Left ventricle: The cavity size was normal. Systolic function was   normal. The estimated ejection fraction was in the range of 50%   to 55%. Wall motion was normal; there were no regional wall   motion abnormalities. Left ventricular diastolic function   parameters were normal. Impressions: - Normal echocardiogram.  Recent Labs: 07/25/2016: TSH 1.25 03/26/2017: ALT 24; BUN 15; Creatinine, Ser 0.78; Hemoglobin 13.1; Platelets 284.0; Potassium 3.9; Sodium 138  Recent Lipid Panel    Component Value Date/Time   CHOL 170 07/25/2016 1039  TRIG 112.0 07/25/2016 1039   HDL 60.80 07/25/2016 1039   CHOLHDL 3 07/25/2016 1039   VLDL 22.4 07/25/2016 1039   LDLCALC 86 07/25/2016 1039    Physical Exam:    VS:  BP 104/70 (BP Location: Right Arm, Patient Position: Sitting, Cuff Size: Normal)   Pulse 87   Ht 5' 6.25" (1.683 m)   Wt 148 lb 12.8 oz (67.5 kg)   LMP 05/16/2017   SpO2 98%   BMI 23.84 kg/m     Wt Readings from Last 3 Encounters:  05/30/17 148 lb 12.8 oz (67.5 kg)  05/18/17 145 lb 9.6 oz (66 kg)  04/30/17 148 lb 12.8 oz (67.5 kg)     GEN:  Well nourished, well developed in no acute distress HEENT: Normal NECK: No JVD; No carotid bruits LYMPHATICS: No lymphadenopathy CARDIAC: My RRR, no murmurs, rubs, gallops RESPIRATORY:  Clear to auscultation without rales, wheezing or rhonchi  ABDOMEN: Soft, non-tender, non-distended MUSCULOSKELETAL:  No edema; No deformity  SKIN: Warm and dry NEUROLOGIC:  Alert and oriented x 3 PSYCHIATRIC:  Normal affect    Signed, Shirlee More, MD  05/30/2017 11:02 AM    [place]

## 2017-05-30 ENCOUNTER — Encounter: Payer: Self-pay | Admitting: Cardiology

## 2017-05-30 ENCOUNTER — Ambulatory Visit (INDEPENDENT_AMBULATORY_CARE_PROVIDER_SITE_OTHER): Payer: BLUE CROSS/BLUE SHIELD | Admitting: Cardiology

## 2017-05-30 VITALS — BP 104/70 | HR 87 | Ht 66.25 in | Wt 148.8 lb

## 2017-05-30 DIAGNOSIS — R002 Palpitations: Secondary | ICD-10-CM | POA: Diagnosis not present

## 2017-05-30 DIAGNOSIS — M940 Chondrocostal junction syndrome [Tietze]: Secondary | ICD-10-CM | POA: Diagnosis not present

## 2017-05-30 NOTE — Patient Instructions (Addendum)
Medication Instructions:  Your physician recommends that you continue on your current medications as directed. Please refer to the Current Medication list given to you today.  Labwork: None  Testing/Procedures: None  Follow-Up: Your physician recommends that you schedule a follow-up appointment as needed if symptoms worsen or fail to improve.  Any Other Special Instructions Will Be Listed Below (If Applicable).     If you need a refill on your cardiac medications before your next appointment, please call your pharmacy.    1. Avoid all over-the-counter antihistamines except Claritin/Loratadine and Zyrtec/Cetrizine. 2. Avoid all combination including cold sinus allergies flu decongestant and sleep medications 3. You can use Robitussin DM Mucinex and Mucinex DM for cough. 4. can use Tylenol aspirin ibuprofen and naproxen but no combinations such as sleep or sinus.

## 2017-06-12 ENCOUNTER — Encounter: Payer: Self-pay | Admitting: Family Medicine

## 2017-06-12 NOTE — Telephone Encounter (Signed)
Please disregard previous note about [facility] patient. I will route to [facility].

## 2017-06-12 NOTE — Telephone Encounter (Signed)
Please advise 

## 2017-06-13 ENCOUNTER — Other Ambulatory Visit: Payer: Self-pay | Admitting: Surgical

## 2017-06-13 DIAGNOSIS — R1013 Epigastric pain: Secondary | ICD-10-CM

## 2017-06-13 MED ORDER — OMEPRAZOLE 40 MG PO CPDR: 40.0000 mg | DELAYED_RELEASE_CAPSULE | Freq: Every day | ORAL | 3 refills | Status: DC

## 2017-07-02 ENCOUNTER — Ambulatory Visit: Payer: BLUE CROSS/BLUE SHIELD | Admitting: Neurology

## 2017-07-10 ENCOUNTER — Telehealth: Payer: Self-pay | Admitting: Family Medicine

## 2017-07-10 DIAGNOSIS — Z981 Arthrodesis status: Principal | ICD-10-CM

## 2017-07-10 DIAGNOSIS — Z8739 Personal history of other diseases of the musculoskeletal system and connective tissue: Secondary | ICD-10-CM

## 2017-07-10 NOTE — Telephone Encounter (Signed)
Copied from [place] [id number]. Topic: Referral - Request >> Jul 10, 2017 12:07 PM Hewitt Shorts wrote: Pt is wanting to see someone here locally for her back problems instead of back where she is from  [place] number [phone]

## 2017-07-10 NOTE — Telephone Encounter (Signed)
I have placed a referral for the patient. Told patent that [facility] will contact her with appointment.

## 2017-07-16 ENCOUNTER — Encounter: Payer: Self-pay | Admitting: Family Medicine

## 2017-07-17 NOTE — Progress Notes (Signed)
Alison Fleming is a 42 y.o. female here for an acute visit.  History of Present Illness:   HPI: FROM MYCHART EMAIL: I've had a headache stemming from the back of my head for almost 2 weeks straight, aspirin, ibuprofen, aleve do not touch it. It does feel better most nights once I fall asleep and doesn't start again until I've been awake for about 20-30 minutes. It starts in the lower back of my head and goes around both sides of my head, over my ears and up to my eyes. It feels like I'm wearing a super tight headband. When I feel it in my face it's almost like a sinus headache, but the pain is worst in the back and sides of my head. My appt with the neurologist isn't until Aug 1st, I wasn't sure what else I should try? Sometimes it's nauseating, sometimes it makes me super tired and of course I have a hard time concentrating with work and such. She had friend that is Product/process development scientist that did some massages that made if feel little better. She is still having neck pain.   PMHx, SurgHx, SocialHx, Medications, and Allergies were reviewed in the Visit Navigator and updated as appropriate.  Current Medications:   .  cholecalciferol (VITAMIN D) 1000 units tablet, Take 1,000 Units by mouth daily., Disp: , Rfl:  .  CRANBERRY PO, Take by mouth every other day., Disp: , Rfl:  .  cyanocobalamin 100 MCG tablet, Take 100 mcg by mouth every other day. , Disp: , Rfl:  .  naproxen sodium (ALEVE) 220 MG tablet, Take 1 tablet (220 mg total) by mouth daily as needed. (Patient taking differently: Take 220 mg by mouth daily. ), Disp: , Rfl:  .  Norethindrone Acetate-Ethinyl Estradiol (MICROGESTIN) 1.5-30 MG-MCG tablet, Take 1 tablet by mouth daily., Disp: 3 Package, Rfl: 4 .  omeprazole (PRILOSEC) 40 MG capsule, Take 1 capsule (40 mg total) by mouth daily., Disp: 30 capsule, Rfl: 3 .  Prenatal Vit-Fe Fumarate-FA (MULTIVITAMIN-PRENATAL) 27-0.8 MG TABS, Take 1 tablet by mouth daily., Disp: , Rfl:  .  propranolol  (INDERAL) 10 MG tablet, Take 1 tablet (10 mg total) by mouth 3 (three) times daily as needed., Disp: 30 tablet, Rfl: 0 .  RaNITidine HCl (ZANTAC PO), Take by mouth as needed. , Disp: , Rfl:  .  valACYclovir (VALTREX) 1000 MG tablet, Take 1 tablet (1,000 mg total) by mouth 2 (two) times daily. (Patient taking differently: Take 1,000 mg by mouth 2 (two) times daily. As needed), Disp: 20 tablet, Rfl: 0 .  buPROPion (WELLBUTRIN XL) 150 MG 24 hr tablet, Take 1 tablet (150 mg total) by mouth daily. Start 150 mg ER PO qam, increase after 3 days to 150 mg ER PO BID. (Patient not taking: Reported on 05/18/2017), Disp: 30 tablet, Rfl: 3  Allergies  Allergen Reactions  . Zoloft [Sertraline Hcl] Other (See Comments)    Sedation   Review of Systems:   Pertinent items are noted in the HPI. Otherwise, ROS is negative.  Vitals:   Vitals:   07/18/17 0757  BP: 104/68  Pulse: 95  Temp: 98 F (36.7 C)  TempSrc: Oral  SpO2: 96%  Weight: 144 lb 9.6 oz (65.6 kg)  Height: 5' 6.25" (1.683 m)     Body mass index is 23.16 kg/m.  Physical Exam:   Physical Exam  Constitutional: She is oriented to person, place, and time. She appears well-nourished.  HENT:  Head: Normocephalic and atraumatic.  Eyes:  Pupils are equal, round, and reactive to light. EOM are normal.  Neck: Normal range of motion. Neck supple.  Cardiovascular: Normal rate, regular rhythm, normal heart sounds and intact distal pulses.  Pulmonary/Chest: Effort normal.  Abdominal: Soft.  Neurological: She is alert and oriented to person, place, and time. No cranial nerve deficit or sensory deficit.  Skin: Skin is warm.  Psychiatric: She has a normal mood and affect. Her behavior is normal.  Nursing note and vitals reviewed.   Assessment and Plan:   [name] was seen today for headache.  Diagnoses and all orders for this visit:  Cervicogenic headache Comments: Recommended stretches (handout provided) Orders: -     cyclobenzaprine  (FLEXERIL) 5 MG tablet; Take 1 tablet (5 mg total) by mouth 3 (three) times daily as needed for muscle spasms.    . Reviewed expectations re: course of current medical issues. . Discussed self-management of symptoms. . Outlined signs and symptoms indicating need for more acute intervention. . Patient verbalized understanding and all questions were answered. [name] Health Maintenance issues including appropriate healthy diet, exercise, and smoking avoidance were discussed with patient. . See orders for this visit as documented in the electronic medical record. . Patient received an After Visit Summary.   [name], DO [place], [facility] 07/18/2017

## 2017-07-18 ENCOUNTER — Ambulatory Visit (INDEPENDENT_AMBULATORY_CARE_PROVIDER_SITE_OTHER): Payer: BLUE CROSS/BLUE SHIELD | Admitting: Family Medicine

## 2017-07-18 ENCOUNTER — Encounter: Payer: Self-pay | Admitting: Family Medicine

## 2017-07-18 VITALS — BP 104/68 | HR 95 | Temp 98.0°F | Ht 66.25 in | Wt 144.6 lb

## 2017-07-18 DIAGNOSIS — R51 Headache: Secondary | ICD-10-CM | POA: Diagnosis not present

## 2017-07-18 DIAGNOSIS — G4486 Cervicogenic headache: Secondary | ICD-10-CM

## 2017-07-18 MED ORDER — CYCLOBENZAPRINE HCL 5 MG PO TABS: 5.0000 mg | ORAL_TABLET | Freq: Three times a day (TID) | ORAL | 0 refills | Status: DC | PRN

## 2017-07-18 NOTE — Patient Instructions (Addendum)
Spine and scoliosis specialist in [place]. Phone: [phone]  [facility]

## 2017-08-01 DIAGNOSIS — M545 Low back pain: Secondary | ICD-10-CM | POA: Diagnosis not present

## 2017-08-01 DIAGNOSIS — G5600 Carpal tunnel syndrome, unspecified upper limb: Secondary | ICD-10-CM | POA: Diagnosis not present

## 2017-08-01 DIAGNOSIS — M546 Pain in thoracic spine: Secondary | ICD-10-CM | POA: Diagnosis not present

## 2017-08-01 DIAGNOSIS — M4120 Other idiopathic scoliosis, site unspecified: Secondary | ICD-10-CM | POA: Diagnosis not present

## 2017-08-09 ENCOUNTER — Other Ambulatory Visit: Payer: Self-pay | Admitting: Rehabilitation

## 2017-08-10 ENCOUNTER — Other Ambulatory Visit: Payer: Self-pay | Admitting: Rehabilitation

## 2017-08-10 DIAGNOSIS — M546 Pain in thoracic spine: Secondary | ICD-10-CM

## 2017-08-10 DIAGNOSIS — M4120 Other idiopathic scoliosis, site unspecified: Secondary | ICD-10-CM

## 2017-08-16 ENCOUNTER — Encounter: Payer: Self-pay | Admitting: Neurology

## 2017-08-16 ENCOUNTER — Ambulatory Visit (INDEPENDENT_AMBULATORY_CARE_PROVIDER_SITE_OTHER): Payer: BLUE CROSS/BLUE SHIELD | Admitting: Neurology

## 2017-08-16 ENCOUNTER — Encounter

## 2017-08-16 VITALS — BP 107/68 | HR 75 | Ht 66.25 in | Wt 146.0 lb

## 2017-08-16 DIAGNOSIS — IMO0001 Reserved for inherently not codable concepts without codable children: Secondary | ICD-10-CM

## 2017-08-16 DIAGNOSIS — M542 Cervicalgia: Secondary | ICD-10-CM | POA: Diagnosis not present

## 2017-08-16 DIAGNOSIS — R209 Unspecified disturbances of skin sensation: Secondary | ICD-10-CM | POA: Diagnosis not present

## 2017-08-16 DIAGNOSIS — G44209 Tension-type headache, unspecified, not intractable: Secondary | ICD-10-CM | POA: Diagnosis not present

## 2017-08-16 MED ORDER — METHYLPREDNISOLONE 4 MG PO TABS: ORAL_TABLET | ORAL | 0 refills | Status: DC

## 2017-08-16 NOTE — Progress Notes (Signed)
[facility]  PATIENT: Alison Fleming DOB: [birth year]  REFERRING DOCTOR OR PCP:  [name] SOURCE: Patient, notes from Dr. Juleen China, laboratory results  _________________________________   HISTORICAL  CHIEF COMPLAINT:  Chief Complaint  Patient presents with  . New Patient (Initial Visit)    Patient reports having "random pains" in the tops of her forearms and thighs. She also reports frequent headaches and pains in the back of her head. Fatigue and focus have been an issue as well.     HISTORY OF PRESENT ILLNESS:  I had the pleasure seeing a patient, Alison Fleming, at [facility] for neurologic consultation regarding her limb pain and other neurologic symptoms.  She is a 42 year old woman who has had random pains in her arms and legs x 1 year.   Pain is in the anterior thigh and the forearm.    She also sometimes notes pain in the left upper arm and chest but this has not occurred much.    She currently denies weakness.  However, last winter, she felt generally weak, prompting a workup.   The Lyme IgM was elevated with a positive Western blot December 2018 and the same pattern was seen a month later.  Of note, the IgG was negative, implying that the IgM results were a false positive..    Pain is random and not associated with activity.    She has tried NSAIDs and does not note much benefit.    Additionally, she is feeling very fatigued.    She sleeps well (7-8 hours) most nights.    She has some sleep maintenance issues but most nights this is minimal.      She had a left > right occipital headache that was more severe x  2 weeks and now is milder.   The current pain is in the back of the head and going towards the vertex.   She will occasionally get mild vertigo with changes in position for 30-60 seconds but never consistently.      She had scoliosis surgery and had rods placed in her late 36s.    She is seeing the [place] and is  scheduled for a CT.      She denies any change in bladder function.  She does not note any weakness in the limbs.   She has not had recorded with her vision.  There is no ataxia.  There is no FH of autoimmune disorders.   REVIEW OF SYSTEMS: Constitutional: No fevers, chills, sweats, or change in appetite Eyes: No visual changes, double vision, eye pain Ear, nose and throat: No hearing loss, ear pain, nasal congestion, sore throat Cardiovascular: No chest pain, palpitations Respiratory: No shortness of breath at rest or with exertion.   No wheezes GastrointestinaI: No nausea, vomiting, diarrhea, abdominal pain, fecal incontinence Genitourinary: No dysuria, urinary retention or frequency.  No nocturia. Musculoskeletal:She reports neck pain/occipital pain.   Integumentary: No rash, pruritus, skin lesions Neurological: as above Psychiatric: No depression at this time.  No anxiety Endocrine: No palpitations, diaphoresis, change in appetite, change in weigh or increased thirst Hematologic/Lymphatic: No anemia, purpura, petechiae. Allergic/Immunologic: No itchy/runny eyes, nasal congestion, recent allergic reactions, rashes  ALLERGIES: Allergies  Allergen Reactions  . Zoloft [Sertraline Hcl] Other (See Comments)    Sedation    HOME MEDICATIONS:  Current Outpatient Medications:  .  cholecalciferol (VITAMIN D) 1000 units tablet, Take 1,000 Units by mouth daily., Disp: , Rfl:  .  CRANBERRY PO,  Take by mouth every other day., Disp: , Rfl:  .  cyanocobalamin 100 MCG tablet, Take 100 mcg by mouth every other day. , Disp: , Rfl:  .  cyclobenzaprine (FLEXERIL) 5 MG tablet, Take 1 tablet (5 mg total) by mouth 3 (three) times daily as needed for muscle spasms., Disp: 20 tablet, Rfl: 0 .  naproxen sodium (ALEVE) 220 MG tablet, Take 1 tablet (220 mg total) by mouth daily as needed. (Patient taking differently: Take 220 mg by mouth daily. ), Disp: , Rfl:  .  Norethindrone Acetate-Ethinyl  Estradiol (MICROGESTIN) 1.5-30 MG-MCG tablet, Take 1 tablet by mouth daily., Disp: 3 Package, Rfl: 4 .  omeprazole (PRILOSEC) 40 MG capsule, Take 1 capsule (40 mg total) by mouth daily., Disp: 30 capsule, Rfl: 3 .  Prenatal Vit-Fe Fumarate-FA (MULTIVITAMIN-PRENATAL) 27-0.8 MG TABS, Take 1 tablet by mouth daily., Disp: , Rfl:  .  RaNITidine HCl (ZANTAC PO), Take by mouth as needed. , Disp: , Rfl:  .  valACYclovir (VALTREX) 1000 MG tablet, Take 1 tablet (1,000 mg total) by mouth 2 (two) times daily. (Patient taking differently: Take 1,000 mg by mouth 2 (two) times daily. As needed), Disp: 20 tablet, Rfl: 0 .  buPROPion (WELLBUTRIN XL) 150 MG 24 hr tablet, Take 1 tablet (150 mg total) by mouth daily. Start 150 mg ER PO qam, increase after 3 days to 150 mg ER PO BID. (Patient not taking: Reported on 05/18/2017), Disp: 30 tablet, Rfl: 3 .  propranolol (INDERAL) 10 MG tablet, Take 1 tablet (10 mg total) by mouth 3 (three) times daily as needed. (Patient not taking: Reported on 08/16/2017), Disp: 30 tablet, Rfl: 0  PAST MEDICAL HISTORY: Past Medical History:  Diagnosis Date  . 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 Anxiety   . Atypical chest pain 04/24/2017  . Dyspepsia, s/p normal EGD 03/08/17, with recommendation for lifetime anti-reflux regimen 03/26/2017  . GAD (generalized anxiety disorder) 05/10/2016  . History of spinal fusion for scoliosis 03/26/2017  . Left upper quadrant pain 03/26/2017  . Malaise and fatigue 03/26/2017  . Paresthesias/numbness 04/24/2017  . Polyarthralgia 03/28/2017  . Positive Lyme disease serology   . Seasonal allergies   . Tension headache 03/26/2017    PAST SURGICAL HISTORY: Past Surgical History:  Procedure Laterality Date  . DILATATION & CURRETTAGE/HYSTEROSCOPY WITH RESECTOCOPE  02/12/2012   Procedure: [place];  Surgeon: Azalia Bilis, MD;  Location: [facility];  Service: Gynecology;  Laterality: N/A;  . DILATION AND CURETTAGE OF UTERUS   2010  . DILATION AND CURETTAGE OF UTERUS  2012  . SPINAL FUSION  2008    FAMILY HISTORY: Family History  Problem Relation Age of Onset  . Diabetes Maternal Grandmother   . Hypertension Maternal Grandmother   . Heart disease Maternal Grandmother   . Heart attack Maternal Grandmother   . Osteoporosis Maternal Grandmother   . Diabetes Maternal Grandfather   . Hypertension Maternal Grandfather   . Heart disease Maternal Grandfather   . Heart attack Maternal Grandfather   . Hypertension Mother   . Anxiety disorder Mother   . Diabetes Mother   . Heart murmur Brother     SOCIAL HISTORY:  Social History   Socioeconomic History  . Marital status: Married    Spouse name: Not on file  . Number of children: 2  . Years of education: Not on file  . Highest education level: Not on file  Occupational History  . Occupation: HUMAN RESOURCES    Employer:  [employer]  Social Needs  . Financial resource strain: Not on file  . Food insecurity:    Worry: Not on file    Inability: Not on file  . Transportation needs:    Medical: Not on file    Non-medical: Not on file  Tobacco Use  . Smoking status: Never Smoker  . Smokeless tobacco: Never Used  Substance and Sexual Activity  . Alcohol use: Yes    Alcohol/week: 0.6 - 1.2 oz    Types: 1 - 2 Standard drinks or equivalent per week  . Drug use: No  . Sexual activity: Yes    Partners: Male    Birth control/protection: OCP  Lifestyle  . Physical activity:    Days per week: Not on file    Minutes per session: Not on file  . Stress: Not on file  Relationships  . Social connections:    Talks on phone: Not on file    Gets together: Not on file    Attends religious service: Not on file    Active member of club or organization: Not on file    Attends meetings of clubs or organizations: Not on file    Relationship status: Not on file  . Intimate partner violence:    Fear of current or ex partner: Not on file     Emotionally abused: Not on file    Physically abused: Not on file    Forced sexual activity: Not on file  Other Topics Concern  . Not on file  Social History Narrative  . Not on file     PHYSICAL EXAM  Vitals:   08/16/17 1015  BP: 107/68  Pulse: 75  Weight: 146 lb (66.2 kg)  Height: 5' 6.25" (1.683 m)    Body mass index is 23.39 kg/m.   General: The patient is well-developed and well-nourished and in no acute distress  Eyes:  Funduscopic exam shows normal optic discs and retinal vessels.  Neck: The neck is supple, no carotid bruits are noted.  She has tenderness in the upper cervical region/occiput..  Cardiovascular: The heart has a regular rate and rhythm with a normal S1 and S2. There were no murmurs, gallops or rubs. Lungs are clear to auscultation.  Skin: Extremities are without significant edema.  Musculoskeletal:  Back is nontender  Neurologic Exam  Mental status: The patient is alert and oriented x 3 at the time of the examination. The patient has apparent normal recent and remote memory, with an apparently normal attention span and concentration ability.   Speech is normal.  Cranial nerves: Extraocular movements are full. Pupils are equal, round, and reactive to light and accomodation.  Visual fields are full.  Facial symmetry is present. There is good facial sensation to soft touch bilaterally.Facial strength is normal.  Trapezius and sternocleidomastoid strength is normal. No dysarthria is noted.  The tongue is midline, and the patient has symmetric elevation of the soft palate. No obvious hearing deficits are noted.  Motor:  Muscle bulk is normal.   Tone is normal. Strength is  5 / 5 in all 4 extremities.   Sensory: Sensory testing is intact to pinprick, soft touch and vibration sensation in all 4 extremities.  Coordination: Cerebellar testing reveals good finger-nose-finger and heel-to-shin bilaterally.  Gait and station: Station is normal.   Gait is  normal. Tandem gait is normal. Romberg is negative.   Reflexes: Deep tendon reflexes are symmetric and normal bilaterally.   Plantar responses are flexor.  DIAGNOSTIC DATA (LABS, IMAGING, TESTING) - I reviewed patient records, labs, notes, testing and imaging myself where available.  Lab Results  Component Value Date   WBC 4.7 03/26/2017   HGB 13.1 03/26/2017   HCT 38.1 03/26/2017   MCV 89.9 03/26/2017   PLT 284.0 03/26/2017      Component Value Date/Time   NA 138 03/26/2017 0752   K 3.9 03/26/2017 0752   CL 103 03/26/2017 0752   CO2 27 03/26/2017 0752   GLUCOSE 90 03/26/2017 0752   BUN 15 03/26/2017 0752   CREATININE 0.78 03/26/2017 0752   CREATININE 0.73 12/26/2012 1324   CALCIUM 9.5 03/26/2017 0752   PROT 6.9 03/26/2017 0752   ALBUMIN 4.3 03/26/2017 0752   AST 17 03/26/2017 0752   ALT 24 03/26/2017 0752   ALKPHOS 38 (L) 03/26/2017 0752   BILITOT 0.8 03/26/2017 0752   GFRNONAA >60 05/03/2016 0931   GFRAA >60 05/03/2016 0931   Lab Results  Component Value Date   CHOL 170 07/25/2016   HDL 60.80 07/25/2016   LDLCALC 86 07/25/2016   TRIG 112.0 07/25/2016   CHOLHDL 3 07/25/2016   No results found for: HGBA1C Lab Results  Component Value Date   LYYTKPTW65 681 07/25/2016   Lab Results  Component Value Date   TSH 1.25 07/25/2016       ASSESSMENT AND PLAN  Paresthesias/numbness  Cervicalgia - Plan: MR CERVICAL SPINE WO CONTRAST  Tension headache  Neck pain   In summary, Alison Fleming is a 42 year old woman with dysesthesias in the arms and legs.  Additionally she has upper neck/occipital pain.  Given the combination of the symptoms, a cervical spine or spinal cord process such as degenerative change or myelopathy needs to be ruled out and we will check an MRI of the cervical spine without contrast.  I will have her take a steroid pack to see if that will reduce some of her symptoms including the neck and occipital pain.  We will call her with the results  of the MRI and bring her in based on the findings.  Otherwise, she should call us back if she has new or worsening neurologic symptoms.  Thank you for asking me to see Alison Fleming.  Please let me know if I can be of further assistance with her or other patients in the future.   Gwyndolyn Guilford A. Felecia Shelling, MD, [facility] 02/23/5168, 01:74 AM Certified in Neurology, Clinical Neurophysiology, Sleep Medicine, Pain Medicine and Neuroimaging  [facility] [address] [place], [place] [address] [phone]

## 2017-08-20 ENCOUNTER — Telehealth: Payer: Self-pay | Admitting: Neurology

## 2017-08-20 NOTE — Telephone Encounter (Signed)
[employer]  pending faxed clinical notes to [facility]

## 2017-08-21 ENCOUNTER — Encounter: Payer: Self-pay | Admitting: Family Medicine

## 2017-08-21 ENCOUNTER — Ambulatory Visit (INDEPENDENT_AMBULATORY_CARE_PROVIDER_SITE_OTHER): Payer: BLUE CROSS/BLUE SHIELD | Admitting: Family Medicine

## 2017-08-21 VITALS — BP 114/66 | HR 87 | Temp 98.5°F | Ht 66.25 in | Wt 144.0 lb

## 2017-08-21 DIAGNOSIS — R5381 Other malaise: Secondary | ICD-10-CM | POA: Diagnosis not present

## 2017-08-21 DIAGNOSIS — R209 Unspecified disturbances of skin sensation: Secondary | ICD-10-CM

## 2017-08-21 DIAGNOSIS — R591 Generalized enlarged lymph nodes: Secondary | ICD-10-CM

## 2017-08-21 DIAGNOSIS — R5383 Other fatigue: Secondary | ICD-10-CM

## 2017-08-21 DIAGNOSIS — Z0000 Encounter for general adult medical examination without abnormal findings: Secondary | ICD-10-CM | POA: Diagnosis not present

## 2017-08-21 DIAGNOSIS — M778 Other enthesopathies, not elsewhere classified: Secondary | ICD-10-CM

## 2017-08-21 DIAGNOSIS — IMO0001 Reserved for inherently not codable concepts without codable children: Secondary | ICD-10-CM

## 2017-08-21 DIAGNOSIS — F411 Generalized anxiety disorder: Secondary | ICD-10-CM

## 2017-08-21 DIAGNOSIS — Z981 Arthrodesis status: Secondary | ICD-10-CM

## 2017-08-21 DIAGNOSIS — Z8739 Personal history of other diseases of the musculoskeletal system and connective tissue: Secondary | ICD-10-CM

## 2017-08-21 DIAGNOSIS — G5603 Carpal tunnel syndrome, bilateral upper limbs: Secondary | ICD-10-CM

## 2017-08-21 LAB — COMPREHENSIVE METABOLIC PANEL
ALT: 18 U/L (ref 0–35)
AST: 15 U/L (ref 0–37)
Albumin: 4.7 g/dL (ref 3.5–5.2)
Alkaline Phosphatase: 36 U/L — ABNORMAL LOW (ref 39–117)
BUN: 12 mg/dL (ref 6–23)
CO2: 26 mEq/L (ref 19–32)
Calcium: 9.9 mg/dL (ref 8.4–10.5)
Chloride: 101 mEq/L (ref 96–112)
Creatinine, Ser: 0.88 mg/dL (ref 0.40–1.20)
GFR: 75.39 mL/min (ref >60.00)
Glucose, Bld: 107 mg/dL — ABNORMAL HIGH (ref 70–99)
Potassium: 3.7 mEq/L (ref 3.5–5.1)
Sodium: 137 mEq/L (ref 135–145)
Total Bilirubin: 0.6 mg/dL (ref 0.2–1.2)
Total Protein: 7.8 g/dL (ref 6.0–8.3)

## 2017-08-21 LAB — LIPID PANEL
Cholesterol: 175 mg/dL (ref 0–200)
HDL: 58.5 mg/dL (ref >39.00)
LDL Cholesterol: 95 mg/dL (ref 0–99)
NonHDL: 116.17
Total CHOL/HDL Ratio: 3
Triglycerides: 106 mg/dL (ref 0.0–149.0)
VLDL: 21.2 mg/dL (ref 0.0–40.0)

## 2017-08-21 LAB — CBC WITH DIFFERENTIAL/PLATELET
Basophils Absolute: 0 10*3/uL (ref 0.0–0.1)
Basophils Relative: 0.2 % (ref 0.0–3.0)
Eosinophils Absolute: 0 10*3/uL (ref 0.0–0.7)
Eosinophils Relative: 0.2 % (ref 0.0–5.0)
HCT: 38 % (ref 36.0–46.0)
Hemoglobin: 13.3 g/dL (ref 12.0–15.0)
Lymphocytes Relative: 14.6 % (ref 12.0–46.0)
Lymphs Abs: 1.3 10*3/uL (ref 0.7–4.0)
MCHC: 34.9 g/dL (ref 30.0–36.0)
MCV: 89.2 fl (ref 78.0–100.0)
Monocytes Absolute: 0.4 10*3/uL (ref 0.1–1.0)
Monocytes Relative: 4.9 % (ref 3.0–12.0)
Neutro Abs: 7 10*3/uL (ref 1.4–7.7)
Neutrophils Relative %: 80.1 % — ABNORMAL HIGH (ref 43.0–77.0)
Platelets: 289 10*3/uL (ref 150.0–400.0)
RBC: 4.26 Mil/uL (ref 3.87–5.11)
RDW: 12.7 % (ref 11.5–15.5)
WBC: 8.7 10*3/uL (ref 4.0–10.5)

## 2017-08-21 LAB — VITAMIN B12: Vitamin B-12: 575 pg/mL (ref 211–911)

## 2017-08-21 LAB — VITAMIN D 25 HYDROXY (VIT D DEFICIENCY, FRACTURES): VITD: 42.74 ng/mL (ref 30.00–100.00)

## 2017-08-21 LAB — TSH: TSH: 0.79 u[IU]/mL (ref 0.35–4.50)

## 2017-08-21 NOTE — Telephone Encounter (Signed)
I called to check the status they did receive my fax and it is still pending.

## 2017-08-21 NOTE — Progress Notes (Signed)
Alison Fleming is a 42 y.o. female is here for follow up.  History of Present Illness:   HPI:  Evaluation by Dr. Felecia Shelling: In summary, Alison Fleming is a 42 year old woman with dysesthesias in the arms and legs.  Additionally she has upper neck/occipital pain.  Given the combination of the symptoms, a cervical spine or spinal cord process such as degenerative change or myelopathy needs to be ruled out and we will check an MRI of the cervical spine without contrast.  I will have her take a steroid pack to see if that will reduce some of her symptoms including the neck and occipital pain.  We will call her with the results of the MRI and bring her in based on the findings.  Otherwise, she should call us back if she has new or worsening neurologic symptoms.  There are no preventive care reminders to display for this patient.   Depression screen [facility] 2/9 05/18/2017 10/12/2016  Decreased Interest 1 0  Down, Depressed, Hopeless 0 0  PHQ - 2 Score 1 0  Altered sleeping 2 -  Tired, decreased energy 2 -  Change in appetite 3 -  Feeling bad or failure about yourself  0 -  Trouble concentrating 3 -  Moving slowly or fidgety/restless 0 -  Suicidal thoughts 0 -  PHQ-9 Score 11 -  Difficult doing work/chores Somewhat difficult -   PMHx, SurgHx, SocialHx, FamHx, Medications, and Allergies were reviewed in the Visit Navigator and updated as appropriate.   Patient Active Problem List   Diagnosis Date Noted  . Neck pain 08/16/2017  . Palpitation 04/30/2017  . Chest pain in adult 04/24/2017  . Paresthesias/numbness 04/24/2017  . Polyarthralgia 03/28/2017  . Dyspepsia, s/p normal EGD 03/08/17, with recommendation for lifetime anti-reflux regimen 03/26/2017  . History of spinal fusion for scoliosis 03/26/2017  . Malaise and fatigue 03/26/2017  . Left upper quadrant pain 03/26/2017  . Tension headache 03/26/2017  . 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 GAD (generalized anxiety disorder) 05/10/2016    Social History   Tobacco Use  . Smoking status: Never Smoker  . Smokeless tobacco: Never Used  Substance Use Topics  . Alcohol use: Yes    Alcohol/week: 0.6 - 1.2 oz    Types: 1 - 2 Standard drinks or equivalent per week  . Drug use: No   Current Medications and Allergies:   .  cholecalciferol (VITAMIN D) 1000 units tablet, Take 1,000 Units by mouth daily., Disp: , Rfl:  .  CRANBERRY PO, Take by mouth every other day., Disp: , Rfl:  .  cyanocobalamin 100 MCG tablet, Take 100 mcg by mouth every other day. , Disp: , Rfl:  .  cyclobenzaprine (FLEXERIL) 5 MG tablet, Take 1 tablet (5 mg total) by mouth 3 (three) times daily as needed for muscle spasms., Disp: 20 tablet, Rfl: 0 .  methylPREDNISolone (MEDROL) 4 MG tablet, Taper from 6 pills po for one day to 1 pill po the last day over 6 days, Disp: 21 tablet, Rfl: 0 .  naproxen sodium (ALEVE) 220 MG tablet, Take 1 tablet (220 mg total) by mouth daily as needed. (Patient taking differently: Take 220 mg by mouth daily. ), Disp: , Rfl:  .  Norethindrone Acetate-Ethinyl Estradiol (MICROGESTIN) 1.5-30 MG-MCG tablet, Take 1 tablet by mouth daily., Disp: 3 Package, Rfl: 4 .  omeprazole (PRILOSEC) 40 MG capsule, Take 1 capsule (40 mg total) by mouth daily., Disp: 30 capsule, Rfl: 3 .  Prenatal Vit-Fe Fumarate-FA (MULTIVITAMIN-PRENATAL) 27-0.8  MG TABS, Take 1 tablet by mouth daily., Disp: , Rfl:  .  propranolol (INDERAL) 10 MG tablet, Take 1 tablet (10 mg total) by mouth 3 (three) times daily as needed., Disp: 30 tablet, Rfl: 0 .  RaNITidine HCl (ZANTAC PO), Take by mouth as needed. , Disp: , Rfl:  .  valACYclovir (VALTREX) 1000 MG tablet, Take 1 tablet (1,000 mg total) by mouth 2 (two) times daily. (Patient taking differently: Take 1,000 mg by mouth 2 (two) times daily. As needed), Disp: 20 tablet, Rfl: 0   Allergies  Allergen Reactions  . Zoloft [Sertraline Hcl] Other (See Comments)    Sedation   Review of Systems   Pertinent items are noted  in the HPI. Otherwise, ROS is negative.  Vitals:   Vitals:   08/21/17 0716  BP: 114/66  Pulse: 87  Temp: 98.5 F (36.9 C)  TempSrc: Oral  SpO2: 97%  Weight: 144 lb (65.3 kg)  Height: 5' 6.25" (1.683 m)     Body mass index is 23.07 kg/m.  Physical Exam:   Physical Exam  Constitutional: She appears well-nourished.  HENT:  Head: Normocephalic and atraumatic.  Eyes: Pupils are equal, round, and reactive to light. EOM are normal.  Neck: Normal range of motion. Neck supple.  Cardiovascular: Normal rate, regular rhythm, normal heart sounds and intact distal pulses.  Pulmonary/Chest: Effort normal. Right breast exhibits skin change. Right breast exhibits no inverted nipple, no mass, no nipple discharge and no tenderness. Left breast exhibits no inverted nipple, no mass, no nipple discharge, no skin change and no tenderness.  Dry skin on left nipple, chronic per patient.   Abdominal: Soft.  Skin: Skin is warm.  Psychiatric: She has a normal mood and affect. Her behavior is normal.  Nursing note and vitals reviewed.  Assessment and Plan:   Alison Fleming was seen today for annual exam.  Diagnoses and all orders for this visit:  Routine physical examination  Malaise and fatigue -     CBC with Differential/Platelet -     Comprehensive metabolic panel -     Lipid panel -     Vitamin B12 -     VITAMIN D 25 Hydroxy (Vit-D Deficiency, Fractures) -     TSH -     Thyroid peroxidase antibody  Paresthesias/numbness Comments: Continues. MRI pending by Neurology. Orders: -     CBC with Differential/Platelet -     Comprehensive metabolic panel -     Lipid panel -     Vitamin B12 -     VITAMIN D 25 Hydroxy (Vit-D Deficiency, Fractures) -     TSH -     Thyroid peroxidase antibody  History of spinal fusion for scoliosis Comments: Feeling "cracking" in area of fusion. Orthopedics ordered CT to be done this week.  GAD (generalized anxiety disorder) Comments: Not on medication.  Working on coping mechanisms while finishing work-ups.  Lymphadenopathy Comments: Left upper arm. Noticed by patient daily for a few months now. CT this week - will see if it would be visualized.  Bilateral carpal tunnel syndrome Comments: Wearing splints with some relief.  Right elbow tendinitis Comments: Wearing splint with some relief.    . Reviewed expectations re: course of current medical issues. . Discussed self-management of symptoms. . Outlined signs and symptoms indicating need for more acute intervention. . Patient verbalized understanding and all questions were answered. [name] Health Maintenance issues including appropriate healthy diet, exercise, and smoking avoidance were discussed with patient. . See orders  for this visit as documented in the electronic medical record. . Patient received an After Visit Summary.  [name], DO [place], [place] 08/21/2017  Patient Counseling: [x]    Nutrition: Stressed importance of moderation in sodium/caffeine intake, saturated fat and cholesterol, caloric balance, sufficient intake of fresh fruits, vegetables, fiber, calcium, iron, and 1 mg of folate supplement per day (for females capable of pregnancy).  [x]    Stressed the importance of regular exercise.   [x]    Substance Abuse: Discussed cessation/primary prevention of tobacco, alcohol, or other drug use; driving or other dangerous activities under the influence; availability of treatment for abuse.   [x]    Injury prevention: Discussed safety belts, safety helmets, smoke detector, smoking near bedding or upholstery.   [x]    Sexuality: Discussed sexually transmitted diseases, partner selection, use of condoms, avoidance of unintended pregnancy  and contraceptive alternatives.  [x]    Dental health: Discussed importance of regular tooth brushing, flossing, and dental visits.  [x]    Health maintenance and immunizations reviewed. Please refer to Health maintenance section.    [name], DO [place]

## 2017-08-22 LAB — THYROID PEROXIDASE ANTIBODY: Thyroperoxidase Ab SerPl-aCnc: 3 IU/mL (ref <9)

## 2017-08-23 NOTE — Telephone Encounter (Signed)
I called and provided more information.  MRI approved 08/23/17 thru 10/07/17.  Approval# [id number]

## 2017-08-23 NOTE — Telephone Encounter (Signed)
Noted, thank you

## 2017-08-23 NOTE — Telephone Encounter (Signed)
I called to check the status on the pending case and they informed me that it has been denied because it has not been 6 weeks of treatment with a physician guided care with a clinical re-evaluation either by phone or office visit. A P2P is an option the phone number is [phone] option 1 and the case number is [id number].

## 2017-08-24 ENCOUNTER — Ambulatory Visit
Admission: RE | Admit: 2017-08-24 | Discharge: 2017-08-24 | Disposition: A | Discharge status: Home / Self Care | Payer: BLUE CROSS/BLUE SHIELD | Source: Ambulatory Visit | Attending: Rehabilitation | Admitting: Rehabilitation

## 2017-08-24 DIAGNOSIS — M546 Pain in thoracic spine: Secondary | ICD-10-CM

## 2017-08-24 DIAGNOSIS — M4186 Other forms of scoliosis, lumbar region: Secondary | ICD-10-CM | POA: Diagnosis not present

## 2017-08-24 DIAGNOSIS — M47814 Spondylosis without myelopathy or radiculopathy, thoracic region: Secondary | ICD-10-CM | POA: Diagnosis not present

## 2017-08-24 DIAGNOSIS — M4120 Other idiopathic scoliosis, site unspecified: Secondary | ICD-10-CM

## 2017-08-28 ENCOUNTER — Other Ambulatory Visit: Payer: Self-pay

## 2017-08-28 DIAGNOSIS — M4120 Other idiopathic scoliosis, site unspecified: Secondary | ICD-10-CM | POA: Diagnosis not present

## 2017-08-28 DIAGNOSIS — R591 Generalized enlarged lymph nodes: Secondary | ICD-10-CM

## 2017-08-28 DIAGNOSIS — M546 Pain in thoracic spine: Secondary | ICD-10-CM | POA: Diagnosis not present

## 2017-09-06 ENCOUNTER — Encounter: Payer: Self-pay | Admitting: Family Medicine

## 2017-09-08 ENCOUNTER — Ambulatory Visit
Admission: RE | Admit: 2017-09-08 | Discharge: 2017-09-08 | Disposition: A | Discharge status: Home / Self Care | Payer: BLUE CROSS/BLUE SHIELD | Source: Ambulatory Visit | Attending: Neurology | Admitting: Neurology

## 2017-09-08 DIAGNOSIS — M542 Cervicalgia: Secondary | ICD-10-CM | POA: Diagnosis not present

## 2017-09-10 ENCOUNTER — Telehealth: Payer: Self-pay | Admitting: *Deleted

## 2017-09-10 NOTE — Telephone Encounter (Signed)
Spoke to patient - she is aware of result and verbalized understanding.

## 2017-09-10 NOTE — Telephone Encounter (Signed)
-----   Message from Britt Bottom, MD sent at 09/09/2017  7:59 PM EDT ----- Please let her know that the MRI of the cervical spine shows some mild disc bulges but there is no nerve root compression.

## 2017-09-12 ENCOUNTER — Encounter: Payer: Self-pay | Admitting: Family Medicine

## 2017-09-20 ENCOUNTER — Telehealth: Payer: Self-pay | Admitting: Certified Nurse Midwife

## 2017-09-20 NOTE — Telephone Encounter (Signed)
Patient called to report her bleeding in between cycles is getting worse. She said she is also having discharge and burning. She said she is not available to come in today.  Last seen: 03/28/17

## 2017-09-20 NOTE — Telephone Encounter (Signed)
Spoke with patient she states that her cycles are getting lighter while intermenstrual spotting has increased. Reports ongoing urinary frequency.  No pelvic pain or fevers, no dysuria. States this has been ongoing for a "few months" and "woild like to get this checked out."  [place] office visit today. Office visit for tomorrow with Melvia Heaps CNM scheduled for 09/21/17.

## 2017-09-21 ENCOUNTER — Encounter: Payer: Self-pay | Admitting: Certified Nurse Midwife

## 2017-09-21 ENCOUNTER — Other Ambulatory Visit: Payer: Self-pay

## 2017-09-21 ENCOUNTER — Ambulatory Visit (INDEPENDENT_AMBULATORY_CARE_PROVIDER_SITE_OTHER): Payer: BLUE CROSS/BLUE SHIELD | Admitting: Certified Nurse Midwife

## 2017-09-21 VITALS — BP 100/62 | Temp 98.9°F | Ht 66.25 in | Wt 145.0 lb

## 2017-09-21 DIAGNOSIS — N938 Other specified abnormal uterine and vaginal bleeding: Secondary | ICD-10-CM | POA: Diagnosis not present

## 2017-09-21 DIAGNOSIS — Z0000 Encounter for general adult medical examination without abnormal findings: Secondary | ICD-10-CM | POA: Diagnosis not present

## 2017-09-21 DIAGNOSIS — Z01419 Encounter for gynecological examination (general) (routine) without abnormal findings: Secondary | ICD-10-CM

## 2017-09-21 DIAGNOSIS — R350 Frequency of micturition: Secondary | ICD-10-CM | POA: Diagnosis not present

## 2017-09-21 DIAGNOSIS — N898 Other specified noninflammatory disorders of vagina: Secondary | ICD-10-CM

## 2017-09-21 LAB — POCT URINALYSIS DIPSTICK
Bilirubin, UA: NEGATIVE
Glucose, UA: NEGATIVE
KETONES UA: NEGATIVE
Leukocytes, UA: NEGATIVE
Nitrite, UA: NEGATIVE
PH UA: 5 (ref 5.0–8.0)
Protein, UA: NEGATIVE
RBC UA: NEGATIVE
UROBILINOGEN UA: NEGATIVE U/dL — AB

## 2017-09-21 NOTE — Progress Notes (Signed)
42 y.o. Married Caucasian female [phone] here with complaint of ? UTI, with urinary urgency over the past months. Patient has not noted any pain with urination. Patient denies fever, chills, nausea or back pain. No new personal products Also has noted increase discharge appears white, clear on underwear and is getting old having discharge... Patient feels not related to sexual activity. Denies any vaginal itching.    Contraception is OCP Microgestin,no changes. Had noted slight lower pelvic pain yesterday.  She is past mid cycle in her pill pack. Spotting is still occurring between cycles and has noted more over the past few months, occasional bright red to brown. No pelvic pain with occurrence. Has [place] scheduled per PCP for enlarged axillary lymph node. Has not had breast exam. No other issues today.  Review of Systems  Genitourinary: Positive for urgency.       Pelvic discomfort, spotting between cycles, vaginal discharge    O: Healthy female WDWN Affect: Normal, orientation x 3 Skin : warm and dry Breasts: bilateral breast exam, no masses, tenderness or nipple discharge, no axillary lymph node palpated in left, slightly ? Enlarged lymph node in axilla on outside of arm pit, non tender(this is the one to be [place]) CVAT: negative bilateral Abdomen: negative for suprapubic tenderness or rebound tenderness, no masses noted Pelvic exam: External genital area: normal, no lesions Bladder,Urethra non tender, Urethral meatus: non tender, slight pink, no lesions Vagina: normal white appearing vaginal non odorous discharge, normal appearance  Affirm takne Cervix: normal, non tender, friable ectropian cervix noted with Q tip touch only, no bleeding prior Uterus:normal,non tender Adnexa: normal non tender, no fullness or masses Rectum: normal appearance   A: Normal pelvic exam Ectropian cervix, R/O vaginal infection DUB with OCP,, has increased R/O UTI Enlarged axillary lymph node on right, [place] scheduled  per PCP Normal breast exam  P: Reviewed findings of normal pelvic exam and will do affirm screening to see if this causing the urinary frequency. Also discussed cervical finding, but do feel this is the cause of spotting. Feel due to increase that PUS is warranted. If negative would consider changing OCP. Patient agreeable, does not want to change OCP unless needed. Patient will be called with insurance infor and scheduled. Lab: Affirm   Discussed no indication of UTI with physical findings and negative urine. Discussed vaginal irritation can sometimes cause urinary frequency also. Increase water intake and warning signs of UTI given. Lab: Urine culture Keep appoint to evaluate lymph node. Stopped shaving and deodorant use on this side to see if reduces in size.  Rv prn

## 2017-09-22 LAB — VAGINITIS/VAGINOSIS, DNA PROBE
Candida Species: NEGATIVE
GARDNERELLA VAGINALIS: NEGATIVE
Trichomonas vaginosis: NEGATIVE

## 2017-09-22 LAB — URINE CULTURE: Organism ID, Bacteria: NO GROWTH

## 2017-09-24 ENCOUNTER — Other Ambulatory Visit: Payer: Self-pay | Admitting: *Deleted

## 2017-09-24 DIAGNOSIS — N938 Other specified abnormal uterine and vaginal bleeding: Secondary | ICD-10-CM

## 2017-09-27 ENCOUNTER — Telehealth: Payer: Self-pay | Admitting: Obstetrics & Gynecology

## 2017-09-27 NOTE — Telephone Encounter (Signed)
Spoke with patient. Advised ok to keep PUS as scheduled while on menses as long as patient is comfortable having PUS during menses. Patient will keep PUS as scheduled, thankful for return call.   Encounter closed.

## 2017-09-27 NOTE — Telephone Encounter (Signed)
Patient has a PUS scheduled for 10/04/17 and she thinks she may be at the end of her cycle. Patent is asking if she will need to cancel if she on her cycle? Patient said you may leave details on her voicemail.

## 2017-09-28 ENCOUNTER — Ambulatory Visit (INDEPENDENT_AMBULATORY_CARE_PROVIDER_SITE_OTHER): Payer: BLUE CROSS/BLUE SHIELD | Admitting: Physician Assistant

## 2017-09-28 ENCOUNTER — Other Ambulatory Visit: Payer: BLUE CROSS/BLUE SHIELD

## 2017-09-28 ENCOUNTER — Encounter: Payer: Self-pay | Admitting: Physician Assistant

## 2017-09-28 VITALS — BP 110/78 | HR 72 | Ht 66.25 in | Wt 144.0 lb

## 2017-09-28 DIAGNOSIS — K640 First degree hemorrhoids: Secondary | ICD-10-CM

## 2017-09-28 DIAGNOSIS — R194 Change in bowel habit: Secondary | ICD-10-CM | POA: Diagnosis not present

## 2017-09-28 NOTE — Patient Instructions (Addendum)
Try a daily probiotic such as Align.   Your provider suggest that you drink more water. Try to have at least 6-8 8 oz glasses of water daily.   Your provider has requested that you go to the basement level for lab work before leaving today. Press "B" on the elevator. The lab is located at the first door on the left as you exit the elevator.  High-Fiber Diet Fiber, also called dietary fiber, is a type of carbohydrate found in fruits, vegetables, whole grains, and beans. A high-fiber diet can have many health benefits. Your health care provider may recommend a high-fiber diet to help:  Prevent constipation. Fiber can make your bowel movements more regular.  Lower your cholesterol.  Relieve hemorrhoids, uncomplicated diverticulosis, or irritable bowel syndrome.  Prevent overeating as part of a weight-loss plan.  Prevent heart disease, type 2 diabetes, and certain cancers.  What is my plan? The recommended daily intake of fiber includes:  38 grams for men under age 5.  42 grams for men over age 1.  34 grams for women under age 80.  29 grams for women over age 66.  You can get the recommended daily intake of dietary fiber by eating a variety of fruits, vegetables, grains, and beans. Your health care provider may also recommend a fiber supplement if it is not possible to get enough fiber through your diet. What do I need to know about a high-fiber diet?  Fiber supplements have not been widely studied for their effectiveness, so it is better to get fiber through food sources.  Always check the fiber content on thenutrition facts label of any prepackaged food. Look for foods that contain at least 5 grams of fiber per serving.  Ask your dietitian if you have questions about specific foods that are related to your condition, especially if those foods are not listed in the following section.  Increase your daily fiber consumption gradually. Increasing your intake of dietary fiber too  quickly may cause bloating, cramping, or gas.  Drink plenty of water. Water helps you to digest fiber. What foods can I eat? Grains Whole-grain breads. Multigrain cereal. Oats and oatmeal. Brown rice. Barley. Bulgur wheat. Laie. Bran muffins. Popcorn. Rye wafer crackers. Vegetables Sweet potatoes. Spinach. Kale. Artichokes. Cabbage. Broccoli. Green peas. Carrots. Squash. Fruits Berries. Pears. Apples. Oranges. Avocados. Prunes and raisins. Dried figs. Meats and Other Protein Sources Navy, kidney, pinto, and soy beans. Split peas. Lentils. Nuts and seeds. Dairy Fiber-fortified yogurt. Beverages Fiber-fortified soy milk. Fiber-fortified orange juice. Other Fiber bars. The items listed above may not be a complete list of recommended foods or beverages. Contact your dietitian for more options. What foods are not recommended? Grains White bread. Pasta made with refined flour. White rice. Vegetables Fried potatoes. Canned vegetables. Well-cooked vegetables. Fruits Fruit juice. Cooked, strained fruit. Meats and Other Protein Sources Fatty cuts of meat. Fried Sales executive or fried fish. Dairy Milk. Yogurt. Cream cheese. Sour cream. Beverages Soft drinks. Other Cakes and pastries. Butter and oils. The items listed above may not be a complete list of foods and beverages to avoid. Contact your dietitian for more information. What are some tips for including high-fiber foods in my diet?  Eat a wide variety of high-fiber foods.  Make sure that half of all grains consumed each day are whole grains.  Replace breads and cereals made from refined flour or white flour with whole-grain breads and cereals.  Replace white rice with brown rice, bulgur wheat, or millet.  Start the day with a breakfast that is high in fiber, such as a cereal that contains at least 5 grams of fiber per serving.  Use beans in place of meat in soups, salads, or pasta.  Eat high-fiber snacks, such as berries, raw  vegetables, nuts, or popcorn. This information is not intended to replace advice given to you by your health care provider. Make sure you discuss any questions you have with your health care provider. Document Released: 01/02/2005 Document Revised: 06/10/2015 Document Reviewed: 06/17/2013 Elsevier Interactive Patient Education  [employer].

## 2017-09-28 NOTE — Progress Notes (Signed)
Chief Complaint: Change in bowel habits, hemorrhoids  HPI:    Alison Fleming is a 42 year old female with a past medical history as listed below, known to Dr. Silverio Decamp, who presents to clinic today with a complaint of change in bowel habits and hemorrhoids.      03/08/2017 EGD with Dr. Silverio Decamp was normal.  Patient continued on Prilosec 20 mg daily.    Today, the patient tells me that over the past 2 to 3 months she has noticed a change in her bowel habits.  Typically, they were "little balls stuck together" and now when she has what she calls her normal stool, it is covered in mucous.  Patient tells me that at the start of all this she had about a week of diarrhea and since then has not gotten back to normal.  Continues to feel like she is going to have diarrhea every time she has a bowel movement with some lower abdominal cramping and discomfort and her stools are "just messy and not normal".  Also tells me that they smell bad.  Her cramping is typically relieved after bowel movement.  Patient cannot think of anything new that she is eating in her diet or being on any antibiotics before this time or any increases in stress or anxiety.  She has never had any problems like this before.    Also tells me today that she has had hemorrhoids ever since she was pregnant.  She wants to make sure that these are "normal".  These did bother her some more and " stuck out" when she was having her diarrhea initially, but now are back to her normal.    Denies fever, chills, blood in her stool, melena, weight loss, anorexia, nausea, vomiting or continued heartburn or reflux.  Past Medical History:  Diagnosis Date  . 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 Anxiety   . Atypical chest pain 04/24/2017  . Dyspepsia, s/p normal EGD 03/08/17, with recommendation for lifetime anti-reflux regimen 03/26/2017  . GAD (generalized anxiety disorder) 05/10/2016  . History of spinal fusion for scoliosis 03/26/2017  . Left upper quadrant pain  03/26/2017  . Malaise and fatigue 03/26/2017  . Paresthesias/numbness 04/24/2017  . Polyarthralgia 03/28/2017  . Positive Lyme disease serology   . Seasonal allergies   . Tension headache 03/26/2017    Past Surgical History:  Procedure Laterality Date  . DILATATION & CURRETTAGE/HYSTEROSCOPY WITH RESECTOCOPE  02/12/2012   Procedure: [place];  Surgeon: Azalia Bilis, MD;  Location: [facility];  Service: Gynecology;  Laterality: N/A;  . DILATION AND CURETTAGE OF UTERUS  2010  . DILATION AND CURETTAGE OF UTERUS  2012  . SPINAL FUSION  2008    Current Outpatient Medications  Medication Sig Dispense Refill  . cholecalciferol (VITAMIN D) 1000 units tablet Take 1,000 Units by mouth daily.    [name] CRANBERRY PO Take by mouth every other day.    . cyanocobalamin 100 MCG tablet Take 100 mcg by mouth every other day.     . naproxen sodium (ALEVE) 220 MG tablet Take 1 tablet (220 mg total) by mouth daily as needed. (Patient taking differently: Take 220 mg by mouth daily. )    . Norethindrone Acetate-Ethinyl Estradiol (MICROGESTIN) 1.5-30 MG-MCG tablet Take 1 tablet by mouth daily. 3 Package 4  . omeprazole (PRILOSEC) 40 MG capsule Take 1 capsule (40 mg total) by mouth daily. 30 capsule 3  . Prenatal Vit-Fe Fumarate-FA (MULTIVITAMIN-PRENATAL) 27-0.8 MG TABS Take 1 tablet by  mouth daily.    . propranolol (INDERAL) 10 MG tablet Take 1 tablet (10 mg total) by mouth 3 (three) times daily as needed. 30 tablet 0  . RaNITidine HCl (ZANTAC PO) Take by mouth as needed.     . valACYclovir (VALTREX) 1000 MG tablet Take 1 tablet (1,000 mg total) by mouth 2 (two) times daily. (Patient taking differently: Take 1,000 mg by mouth 2 (two) times daily. As needed) 20 tablet 0   No current facility-administered medications for this visit.     Allergies as of 09/28/2017 - Review Complete 09/28/2017  Allergen Reaction Noted  . Zoloft [sertraline hcl] Other (See Comments) 03/28/2017     Family History  Problem Relation Age of Onset  . Diabetes Maternal Grandmother   . Hypertension Maternal Grandmother   . Heart disease Maternal Grandmother   . Heart attack Maternal Grandmother   . Osteoporosis Maternal Grandmother   . Diabetes Maternal Grandfather   . Hypertension Maternal Grandfather   . Heart disease Maternal Grandfather   . Heart attack Maternal Grandfather   . Hypertension Mother   . Anxiety disorder Mother   . Diabetes Mother   . Heart murmur Brother     Social History   Socioeconomic History  . Marital status: Married    Spouse name: Not on file  . Number of children: 2  . Years of education: Not on file  . Highest education level: Not on file  Occupational History  . Occupation: HUMAN RESOURCES    Employer: [employer]  Social Needs  . Financial resource strain: Not on file  . Food insecurity:    Worry: Not on file    Inability: Not on file  . Transportation needs:    Medical: Not on file    Non-medical: Not on file  Tobacco Use  . Smoking status: Never Smoker  . Smokeless tobacco: Never Used  Substance and Sexual Activity  . Alcohol use: Yes    Alcohol/week: 1.0 - 2.0 standard drinks    Types: 1 - 2 Standard drinks or equivalent per week  . Drug use: No  . Sexual activity: Yes    Partners: Male    Birth control/protection: OCP  Lifestyle  . Physical activity:    Days per week: Not on file    Minutes per session: Not on file  . Stress: Not on file  Relationships  . Social connections:    Talks on phone: Not on file    Gets together: Not on file    Attends religious service: Not on file    Active member of club or organization: Not on file    Attends meetings of clubs or organizations: Not on file    Relationship status: Not on file  . Intimate partner violence:    Fear of current or ex partner: Not on file    Emotionally abused: Not on file    Physically abused: Not on file    Forced sexual activity: Not  on file  Other Topics Concern  . Not on file  Social History Narrative  . Not on file    Review of Systems:    Constitutional: No weight loss, fever or chills Cardiovascular: No chest pain   Respiratory: No SOB  Gastrointestinal: See HPI and otherwise negative   Physical Exam:  Vital signs: BP 110/78   Pulse 72   Ht 5' 6.25" (1.683 m)   Wt 144 lb (65.3 kg)   LMP 09/05/2017 (Exact Date)  BMI 23.07 kg/m   Constitutional:   Pleasant Caucasian female appears to be in NAD, Well developed, Well nourished, alert and cooperative Respiratory: Respirations even and unlabored. Lungs clear to auscultation bilaterally.   No wheezes, crackles, or rhonchi.  Cardiovascular: Normal S1, S2. No MRG. Regular rate and rhythm. No peripheral edema, cyanosis or pallor.  Gastrointestinal:  Soft, nondistended, nontender. No rebound or guarding. Normal bowel sounds. No appreciable masses or hepatomegaly. Rectal:  External: no hemorrhoids or hemorrhoid tags, one white appearing what appears to be healing posterior fissure; Internal: no mass or discharge; Anoscopy: Grade I internal hemorrhoids, some mucous Psychiatric: Demonstrates good judgement and reason without abnormal affect or behaviors.  MOST RECENT LABS AND IMAGING: CBC    Component Value Date/Time   WBC 8.7 08/21/2017 0749   RBC 4.26 08/21/2017 0749   HGB 13.3 08/21/2017 0749   HGB 12.7 12/26/2012 1129   HCT 38.0 08/21/2017 0749   PLT 289.0 08/21/2017 0749   MCV 89.2 08/21/2017 0749   MCH 30.4 05/03/2016 0931   MCHC 34.9 08/21/2017 0749   RDW 12.7 08/21/2017 0749   LYMPHSABS 1.3 08/21/2017 0749   MONOABS 0.4 08/21/2017 0749   EOSABS 0.0 08/21/2017 0749   BASOSABS 0.0 08/21/2017 0749    CMP     Component Value Date/Time   NA 137 08/21/2017 0749   K 3.7 08/21/2017 0749   CL 101 08/21/2017 0749   CO2 26 08/21/2017 0749   GLUCOSE 107 (H) 08/21/2017 0749   BUN 12 08/21/2017 0749   CREATININE 0.88 08/21/2017 0749   CREATININE 0.73  12/26/2012 1324   CALCIUM 9.9 08/21/2017 0749   PROT 7.8 08/21/2017 0749   ALBUMIN 4.7 08/21/2017 0749   AST 15 08/21/2017 0749   ALT 18 08/21/2017 0749   ALKPHOS 36 (L) 08/21/2017 0749   BILITOT 0.6 08/21/2017 0749   GFRNONAA >60 05/03/2016 0931   GFRAA >60 05/03/2016 0931    Assessment: 1.  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  Internal hemorrhoids: Grade 1 at time of exam today 3.  Healing posterior fissure  Plan: 1.  Discussed that patient may have postinfectious IBS after she had a viral illness which resulted in diarrhea initially. 2.  Recommend the patient start a daily probiotic such as Align once daily over the next 1-2 months-provided coupon 3 . Also recommend patient increase fiber in her diet to at least 25-35 g/day with use of fiber supplement and/or through her diet 4.  Recommend the patient continue to drink increased amount of water 6-8 eight ounce glasses of water per day 5.  If above measures do not help return the patient's stool back to normal, would recommend that she have a colonoscopy. 6.  Order stool studies to include a GI pathogen panel and O&P 7.  Patient will need recheck of her fissure to ensure that it is healing at time of her next visit 8.  Patient to follow in clinic with me in 4-6 weeks.  Ellouise Newer, PA-C [facility] 09/28/2017, 9:53 AM  Cc: [name], DO

## 2017-10-01 ENCOUNTER — Ambulatory Visit
Admission: RE | Admit: 2017-10-01 | Discharge: 2017-10-01 | Disposition: A | Discharge status: Home / Self Care | Payer: BLUE CROSS/BLUE SHIELD | Source: Ambulatory Visit | Attending: Family Medicine | Admitting: Family Medicine

## 2017-10-01 DIAGNOSIS — R591 Generalized enlarged lymph nodes: Secondary | ICD-10-CM | POA: Diagnosis not present

## 2017-10-01 NOTE — Progress Notes (Signed)
Reviewed and agree with documentation and assessment and plan. K. Veena May Manrique , MD   

## 2017-10-04 ENCOUNTER — Ambulatory Visit (INDEPENDENT_AMBULATORY_CARE_PROVIDER_SITE_OTHER): Payer: BLUE CROSS/BLUE SHIELD

## 2017-10-04 ENCOUNTER — Other Ambulatory Visit: Payer: BLUE CROSS/BLUE SHIELD

## 2017-10-04 ENCOUNTER — Ambulatory Visit (INDEPENDENT_AMBULATORY_CARE_PROVIDER_SITE_OTHER): Payer: BLUE CROSS/BLUE SHIELD | Admitting: Obstetrics & Gynecology

## 2017-10-04 ENCOUNTER — Other Ambulatory Visit: Payer: Self-pay | Admitting: Emergency Medicine

## 2017-10-04 VITALS — BP 102/70 | HR 76 | Resp 14 | Ht 66.25 in | Wt 144.0 lb

## 2017-10-04 DIAGNOSIS — R194 Change in bowel habit: Secondary | ICD-10-CM

## 2017-10-04 DIAGNOSIS — N938 Other specified abnormal uterine and vaginal bleeding: Secondary | ICD-10-CM

## 2017-10-04 DIAGNOSIS — K640 First degree hemorrhoids: Secondary | ICD-10-CM

## 2017-10-04 NOTE — Progress Notes (Signed)
42 y.o. [id number] Married White or Caucasian female here for pelvic ultrasound due to midcycle spotting that has been occurring for several months.  She is on microgestin and her actual cycle only last 1 day and 1/2.  She's really pleased with this.  Does have enough spotting/bleeding during her midcycle, that she doesn't want to change medications unless I can guarantee she won't have a change in her menstrual cycle.  .  Patient's last menstrual period was 10/02/2017.  Contraception:  Findings:  UTERUS: 7 x 4.6 x 4.1cm EMS: 2.579m  ADNEXA: Left ovary:  2.4 x 2.6 x 13.2WQwith 173mollicle       Right ovary: no free fluid CUL DE SAC: no free fluid  Discussion:  Findings reviewed with pt.  Most recent pap smear was normal. On 02/23/17 with neg HR HPV.  She really does not want to change her OCP at this time.  D/w pt options if she changes her mind.  Assessment:  DUB with normal ultrasound in 4056o female Endometrium 2.79m379mPlan:  Will continue to monitor conservatively.  If changes her mind about changing medication, she is going to call and let us [place].  Advised not would be seen to E  ~15 minutes spent with patient >50% of time was in face to face discussion of above.

## 2017-10-06 LAB — TIQ-NTM

## 2017-10-07 ENCOUNTER — Encounter: Payer: Self-pay | Admitting: Obstetrics & Gynecology

## 2017-10-09 LAB — GASTROINTESTINAL PATHOGEN PANEL PCR
C. difficile Tox A/B, PCR: NOT DETECTED
Campylobacter, PCR: NOT DETECTED
Cryptosporidium, PCR: NOT DETECTED
E COLI (ETEC) LT/ST, PCR: NOT DETECTED
E coli (STEC) stx1/stx2, PCR: NOT DETECTED
E coli 0157, PCR: NOT DETECTED
GIARDIA LAMBLIA, PCR: NOT DETECTED
Norovirus, PCR: NOT DETECTED
Rotavirus A, PCR: NOT DETECTED
SHIGELLA, PCR: NOT DETECTED
Salmonella, PCR: NOT DETECTED

## 2017-10-09 LAB — STOOL CULTURE
MICRO NUMBER: 91126303
MICRO NUMBER:: 91126300
MICRO NUMBER:: 91126301
SHIGA RESULT:: NOT DETECTED
SPECIMEN QUALITY: ADEQUATE
SPECIMEN QUALITY: ADEQUATE
SPECIMEN QUALITY:: ADEQUATE

## 2017-10-09 LAB — OVA AND PARASITE EXAMINATION
CONCENTRATE RESULT:: NONE SEEN
MICRO NUMBER:: 91126302
SPECIMEN QUALITY: ADEQUATE
TRICHROME RESULT: NONE SEEN

## 2017-10-10 ENCOUNTER — Telehealth: Payer: Self-pay | Admitting: Physician Assistant

## 2017-10-10 NOTE — Telephone Encounter (Signed)
Yes, stool studies are negative. PLease ask how she is doing? If still with diarrhea can give Lomotil q4-6hrs. Thanks-JLL

## 2017-10-10 NOTE — Telephone Encounter (Signed)
Alison Fleming the pt is calling for stool test results, have you reviewed?

## 2017-10-10 NOTE — Telephone Encounter (Signed)
The patient has been notified of this information and all questions answered. The pt states she is doing very well and has some diarrhea.  She will try imodium every morning before lomotil.  She will call if the imodium does not work.

## 2017-10-17 DIAGNOSIS — M545 Low back pain: Secondary | ICD-10-CM | POA: Diagnosis not present

## 2017-10-26 DIAGNOSIS — M545 Low back pain: Secondary | ICD-10-CM | POA: Diagnosis not present

## 2017-11-02 DIAGNOSIS — M545 Low back pain: Secondary | ICD-10-CM | POA: Diagnosis not present

## 2017-11-13 ENCOUNTER — Ambulatory Visit: Payer: BLUE CROSS/BLUE SHIELD | Admitting: Physician Assistant

## 2017-11-15 ENCOUNTER — Encounter: Payer: Self-pay | Admitting: Physician Assistant

## 2017-11-15 ENCOUNTER — Ambulatory Visit (INDEPENDENT_AMBULATORY_CARE_PROVIDER_SITE_OTHER): Payer: BLUE CROSS/BLUE SHIELD | Admitting: Physician Assistant

## 2017-11-15 VITALS — BP 104/62 | HR 68 | Ht 66.0 in | Wt 144.4 lb

## 2017-11-15 DIAGNOSIS — R194 Change in bowel habit: Secondary | ICD-10-CM

## 2017-11-15 MED ORDER — SUPREP BOWEL PREP KIT 17.5-3.13-1.6 GM/177ML PO SOLN: ORAL | 0 refills | Status: DC

## 2017-11-15 NOTE — Progress Notes (Signed)
Chief Complaint: Change in bowel habits  HPI:    Alison Fleming is a 42 year old female with a past medical history as listed below, known to Dr. Silverio Decamp, who returns to clinic today for follow-up of her change in bowel habits.    09/28/2017 office visit to discuss that her bowel movements have changed to "little balls stuck together" and often covered in mucus.  They are just "messy and not normal".  Also was having some rectal pain.  On rectal exam did have a healing posterior fissure as well as grade 1 hemorrhoids.  At that time it was recommended she increase fiber in her diet with a fiber supplement as well as water and start a daily probiotic.  She had a GI pathogen panel and O&P which returned negative/normal.    Today, the patient tells me that she did the probiotic as recommended for a month and increase fiber in her diet but saw no change in her bowel movements.  She tells me that they are still very messy, "all of them do not always come out".  Tells me that she often has to wipe multiple times and this leads to some rectal irritation.  In general they are just "not the same as they were".  Patient has tried to think if there was anything changed in her diet but tells me that she cannot think of anything new that she is eating or drinking.  She wonders if this is just "my new normal".    Denies fever, chills, weight loss, anorexia, nausea, vomiting, abdominal pain, blood in her stool or symptoms that awaken her from sleep.  Past Medical History:  Diagnosis Date  . 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 Anxiety   . Atypical chest pain 04/24/2017  . Dyspepsia, s/p normal EGD 03/08/17, with recommendation for lifetime anti-reflux regimen 03/26/2017  . GAD (generalized anxiety disorder) 05/10/2016  . History of spinal fusion for scoliosis 03/26/2017  . Left upper quadrant pain 03/26/2017  . Malaise and fatigue 03/26/2017  . Paresthesias/numbness 04/24/2017  . Polyarthralgia 03/28/2017  . Positive Lyme  disease serology   . Seasonal allergies   . Tension headache 03/26/2017    Past Surgical History:  Procedure Laterality Date  . DILATATION & CURRETTAGE/HYSTEROSCOPY WITH RESECTOCOPE  02/12/2012   Procedure: [place];  Surgeon: Azalia Bilis, MD;  Location: [facility];  Service: Gynecology;  Laterality: N/A;  . DILATION AND CURETTAGE OF UTERUS  2010  . DILATION AND CURETTAGE OF UTERUS  2012  . SPINAL FUSION  2008    Current Outpatient Medications  Medication Sig Dispense Refill  . cholecalciferol (VITAMIN D) 1000 units tablet Take 1,000 Units by mouth daily.    [name] CRANBERRY PO Take by mouth every other day.    . cyanocobalamin 100 MCG tablet Take 100 mcg by mouth every other day.     . naproxen sodium (ALEVE) 220 MG tablet Take 1 tablet (220 mg total) by mouth daily as needed. (Patient taking differently: Take 220 mg by mouth daily. )    . Norethindrone Acetate-Ethinyl Estradiol (MICROGESTIN) 1.5-30 MG-MCG tablet Take 1 tablet by mouth daily. 3 Package 4  . omeprazole (PRILOSEC) 40 MG capsule Take 1 capsule (40 mg total) by mouth daily. 30 capsule 3  . Prenatal Vit-Fe Fumarate-FA (MULTIVITAMIN-PRENATAL) 27-0.8 MG TABS Take 1 tablet by mouth daily.    . valACYclovir (VALTREX) 1000 MG tablet Take 1 tablet (1,000 mg total) by mouth 2 (two) times daily. (Patient taking  differently: Take 1,000 mg by mouth 2 (two) times daily as needed. As needed) 20 tablet 0   No current facility-administered medications for this visit.     Allergies as of 11/15/2017 - Review Complete 11/15/2017  Allergen Reaction Noted  . Zoloft [sertraline hcl] Other (See Comments) 03/28/2017    Family History  Problem Relation Age of Onset  . Diabetes Maternal Grandmother   . Hypertension Maternal Grandmother   . Heart disease Maternal Grandmother   . Heart attack Maternal Grandmother   . Osteoporosis Maternal Grandmother   . Diabetes Maternal Grandfather   . Hypertension  Maternal Grandfather   . Heart disease Maternal Grandfather   . Heart attack Maternal Grandfather   . Hypertension Mother   . Anxiety disorder Mother   . Diabetes Mother   . Heart murmur Brother     Social History   Socioeconomic History  . Marital status: Married    Spouse name: Not on file  . Number of children: 2  . Years of education: Not on file  . Highest education level: Not on file  Occupational History  . Occupation: HUMAN RESOURCES    Employer: [employer]  Social Needs  . Financial resource strain: Not on file  . Food insecurity:    Worry: Not on file    Inability: Not on file  . Transportation needs:    Medical: Not on file    Non-medical: Not on file  Tobacco Use  . Smoking status: Never Smoker  . Smokeless tobacco: Never Used  Substance and Sexual Activity  . Alcohol use: Yes    Alcohol/week: 1.0 - 2.0 standard drinks    Types: 1 - 2 Standard drinks or equivalent per week  . Drug use: No  . Sexual activity: Yes    Partners: Male    Birth control/protection: OCP  Lifestyle  . Physical activity:    Days per week: Not on file    Minutes per session: Not on file  . Stress: Not on file  Relationships  . Social connections:    Talks on phone: Not on file    Gets together: Not on file    Attends religious service: Not on file    Active member of club or organization: Not on file    Attends meetings of clubs or organizations: Not on file    Relationship status: Not on file  . Intimate partner violence:    Fear of current or ex partner: Not on file    Emotionally abused: Not on file    Physically abused: Not on file    Forced sexual activity: Not on file  Other Topics Concern  . Not on file  Social History Narrative  . Not on file    Review of Systems:    Constitutional: No weight loss, fever or chills Cardiovascular: No chest pain Respiratory: No SOB  Gastrointestinal: See HPI and otherwise negative   Physical Exam:  Vital  signs: BP 104/62   Pulse 68   Ht 5' 6"  (1.676 m)   Wt 144 lb 6 oz (65.5 kg)   BMI 23.30 kg/m   Constitutional:   Pleasant Caucasian female appears to be in NAD, Well developed, Well nourished, alert and cooperative Respiratory: Respirations even and unlabored. Lungs clear to auscultation bilaterally.   No wheezes, crackles, or rhonchi.  Cardiovascular: Normal S1, S2. No MRG. Regular rate and rhythm. No peripheral edema, cyanosis or pallor.  Gastrointestinal:  Soft, nondistended, nontender. No rebound or  guarding. Normal bowel sounds. No appreciable masses or hepatomegaly. Psychiatric: Demonstrates good judgement and reason without abnormal affect or behaviors.  RELEVANT LABS AND IMAGING: CBC    Component Value Date/Time   WBC 8.7 08/21/2017 0749   RBC 4.26 08/21/2017 0749   HGB 13.3 08/21/2017 0749   HGB 12.7 12/26/2012 1129   HCT 38.0 08/21/2017 0749   PLT 289.0 08/21/2017 0749   MCV 89.2 08/21/2017 0749   MCH 30.4 05/03/2016 0931   MCHC 34.9 08/21/2017 0749   RDW 12.7 08/21/2017 0749   LYMPHSABS 1.3 08/21/2017 0749   MONOABS 0.4 08/21/2017 0749   EOSABS 0.0 08/21/2017 0749   BASOSABS 0.0 08/21/2017 0749    CMP     Component Value Date/Time   NA 137 08/21/2017 0749   K 3.7 08/21/2017 0749   CL 101 08/21/2017 0749   CO2 26 08/21/2017 0749   GLUCOSE 107 (H) 08/21/2017 0749   BUN 12 08/21/2017 0749   CREATININE 0.88 08/21/2017 0749   CREATININE 0.73 12/26/2012 1324   CALCIUM 9.9 08/21/2017 0749   PROT 7.8 08/21/2017 0749   ALBUMIN 4.7 08/21/2017 0749   AST 15 08/21/2017 0749   ALT 18 08/21/2017 0749   ALKPHOS 36 (L) 08/21/2017 0749   BILITOT 0.6 08/21/2017 0749   GFRNONAA >60 05/03/2016 0931   GFRAA >60 05/03/2016 0931    Assessment: 1.  Change in bowel habits: Towards stickier stools which are hard to clean up after, no change after a month probiotic and increased fiber, GI pathogen panel and O&P negative; consider IBD versus IBS versus microscopic colitis  versus other 2.  Rectal irritation: Likely from wiping, the patient does have a history of anal fissure, she did not want me to examine her today  Plan: 1.  Scheduled for colonoscopy due to change in bowel habits with Dr. Silverio Decamp.  Did discuss risks, benefits, limitations and alternatives and patient agrees to proceed. 2.  Patient declined rectal exam today but would like Dr. Silverio Decamp to look at this at time of colonoscopy to ensure that everything is healed. 3.  Patient to follow in clinic per recommendations from Dr. Silverio Decamp after time of procedure.  Alison Newer, PA-C [facility] 11/15/2017, 10:27 AM  Cc: [name], DO

## 2017-11-15 NOTE — Progress Notes (Signed)
Alison Fleming is a 42 y.o. female is here for follow up.  History of Present Illness:   HPI:   1. DUB. Followed by GYN. Normal [place]. On Microgestin. 2. Bowel changes. Upcoming colonoscopy. On Probiotics. Elimination diet did not help. 3. Previous labs reviewed and looked great.  4. Neck pain improved. Followed by Spine and Scoliosis. MRI reviewed again. Doing PT and helping tension HA. 5. Axilla nodule. Reviewed [place].  6. GERD. Tried to go down on Prilosec but had to increase again. 7. CP, radiation to left arm. Acute onset, woke her up. Radiation to left arm - felt like numbness/ache. Did some deep breathing and improved. Another episode when walking her kids to the bus stop. That night, she exercised to make sure that it was not her heart - no issues with CP, SOB. Had her husband massage her back and shoulder with some relief. Hx of Cardiology evaluation - negative workup, including Holter and ECHO.  There are no preventive care reminders to display for this patient.   Depression screen [facility] 2/9 05/18/2017 10/12/2016  Decreased Interest 1 0  Down, Depressed, Hopeless 0 0  PHQ - 2 Score 1 0  Altered sleeping 2 -  Tired, decreased energy 2 -  Change in appetite 3 -  Feeling bad or failure about yourself  0 -  Trouble concentrating 3 -  Moving slowly or fidgety/restless 0 -  Suicidal thoughts 0 -  PHQ-9 Score 11 -  Difficult doing work/chores Somewhat difficult -   PMHx, SurgHx, SocialHx, FamHx, Medications, and Allergies were reviewed in the Visit Navigator and updated as appropriate.   Patient Active Problem List   Diagnosis Date Noted  . Dysfunctional uterine bleeding, with normal [place] 11/16/2017  . Change in bowel habits 11/16/2017  . Palpitation 04/30/2017  . Paresthesias/numbness 04/24/2017  . Polyarthralgia 03/28/2017  . Dyspepsia, s/p normal EGD 03/08/17, with recommendation for lifetime anti-reflux regimen 03/26/2017  . History of spinal fusion for scoliosis 03/26/2017  .  Malaise and fatigue 03/26/2017  . Tension headache 03/26/2017  . 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 GAD (generalized anxiety disorder) 05/10/2016   Social History   Tobacco Use  . Smoking status: Never Smoker  . Smokeless tobacco: Never Used  Substance Use Topics  . Alcohol use: Yes    Alcohol/week: 1.0 - 2.0 standard drinks    Types: 1 - 2 Standard drinks or equivalent per week  . Drug use: No   Current Medications and Allergies:   Current Outpatient Medications:  .  cholecalciferol (VITAMIN D) 1000 units tablet, Take 1,000 Units by mouth daily., Disp: , Rfl:  .  CRANBERRY PO, Take by mouth every other day., Disp: , Rfl:  .  cyanocobalamin 100 MCG tablet, Take 100 mcg by mouth every other day. , Disp: , Rfl:  .  naproxen sodium (ALEVE) 220 MG tablet, Take 1 tablet (220 mg total) by mouth daily as needed. (Patient taking differently: Take 220 mg by mouth daily. ), Disp: , Rfl:  .  Norethindrone Acetate-Ethinyl Estradiol (MICROGESTIN) 1.5-30 MG-MCG tablet, Take 1 tablet by mouth daily., Disp: 3 Package, Rfl: 4 .  omeprazole (PRILOSEC) 40 MG capsule, Take 1 capsule (40 mg total) by mouth daily., Disp: 30 capsule, Rfl: 3 .  Prenatal Vit-Fe Fumarate-FA (MULTIVITAMIN-PRENATAL) 27-0.8 MG TABS, Take 1 tablet by mouth daily., Disp: , Rfl:  .  SUPREP BOWEL PREP KIT 17.5-3.13-1.6 GM/177ML SOLN, Suprep-Use as directed, Disp: 354 mL, Rfl: 0 .  valACYclovir (VALTREX) 1000 MG tablet,  Take 1 tablet (1,000 mg total) by mouth 2 (two) times daily. (Patient taking differently: Take 1,000 mg by mouth 2 (two) times daily as needed. As needed), Disp: 20 tablet, Rfl: 0   Allergies  Allergen Reactions  . Zoloft [Sertraline Hcl] Other (See Comments)    Sedation   Review of Systems   Pertinent items are noted in the HPI. Otherwise, ROS is negative.  Vitals:   Vitals:   11/16/17 0719  BP: 118/72  Pulse: 77  Temp: 98.5 F (36.9 C)  TempSrc: Oral  SpO2: 98%  Weight: 140 lb 8 oz (63.7 kg)  Height:  _0  (1.676 m)     Body mass index is 22.68 kg/m.  Physical Exam:   Physical Exam  Constitutional: She appears well-nourished.  HENT:  Head: Normocephalic and atraumatic.  Eyes: Pupils are equal, round, and reactive to light. EOM are normal.  Neck: Normal range of motion. Neck supple.  Cardiovascular: Normal rate, regular rhythm, normal heart sounds and intact distal pulses.  Pulmonary/Chest: Effort normal.  Abdominal: Soft.  Skin: Skin is warm.  Psychiatric: She has a normal mood and affect. Her behavior is normal.  Nursing note and vitals reviewed.  Assessment and Plan:   Diagnoses and all orders for this visit:  Paresthesias/numbness Comments: Improved with PT.  Dysfunctional uterine bleeding, with normal [place] Comments: Contiues OCP.  Change in bowel habits Comments: Upcoming colonoscopy.   Chest pain, unspecified type Comments: Cardiac CT.  Orders: -     CT CARDIAC SCORING   . Reviewed expectations re: course of current medical issues. . Discussed self-management of symptoms. . Outlined signs and symptoms indicating need for more acute intervention. . Patient verbalized understanding and all questions were answered. [name] Health Maintenance issues including appropriate healthy diet, exercise, and smoking avoidance were discussed with patient. . See orders for this visit as documented in the electronic medical record. . Patient received an After Visit Summary.  CMA served as Education administrator during this visit. History, Physical, and Plan performed by medical provider. The above documentation has been reviewed and is accurate and complete. [name], D.O.  [name], DO [place] [place] 11/16/2017

## 2017-11-15 NOTE — Patient Instructions (Signed)
If you are age 41 or older, your body mass index should be between 23-30. Your Body mass index is 23.3 kg/m. If this is out of the aforementioned range listed, please consider follow up with your Primary Care Provider.  If you are age 41 or younger, your body mass index should be between 19-25. Your Body mass index is 23.3 kg/m. If this is out of the aformentioned range listed, please consider follow up with your Primary Care Provider.   You have been scheduled for a colonoscopy. Please follow written instructions given to you at your visit today.  Please pick up your prep supplies at the pharmacy within the next 1-3 days. If you use inhalers (even only as needed), please bring them with you on the day of your procedure. Your physician has requested that you go to www.startemmi.com and enter the access code given to you at your visit today. This web site gives a general overview about your procedure. However, you should still follow specific instructions given to you by our office regarding your preparation for the procedure.  Thank you for choosing [facility] and for entrusting me with your care, Ellouise Newer, PA-C

## 2017-11-16 ENCOUNTER — Ambulatory Visit (INDEPENDENT_AMBULATORY_CARE_PROVIDER_SITE_OTHER): Payer: BLUE CROSS/BLUE SHIELD | Admitting: Family Medicine

## 2017-11-16 ENCOUNTER — Encounter: Payer: Self-pay | Admitting: Family Medicine

## 2017-11-16 DIAGNOSIS — R194 Change in bowel habit: Secondary | ICD-10-CM | POA: Diagnosis not present

## 2017-11-16 DIAGNOSIS — R209 Unspecified disturbances of skin sensation: Secondary | ICD-10-CM

## 2017-11-16 DIAGNOSIS — N938 Other specified abnormal uterine and vaginal bleeding: Secondary | ICD-10-CM | POA: Diagnosis not present

## 2017-11-16 DIAGNOSIS — R079 Chest pain, unspecified: Secondary | ICD-10-CM

## 2017-11-16 DIAGNOSIS — IMO0001 Reserved for inherently not codable concepts without codable children: Secondary | ICD-10-CM

## 2017-11-21 ENCOUNTER — Ambulatory Visit: Payer: BLUE CROSS/BLUE SHIELD | Admitting: Family Medicine

## 2017-11-23 ENCOUNTER — Ambulatory Visit: Payer: BLUE CROSS/BLUE SHIELD | Admitting: Family Medicine

## 2017-11-26 ENCOUNTER — Telehealth: Payer: Self-pay | Admitting: Family Medicine

## 2017-11-26 ENCOUNTER — Ambulatory Visit (INDEPENDENT_AMBULATORY_CARE_PROVIDER_SITE_OTHER)
Admission: RE | Admit: 2017-11-26 | Discharge: 2017-11-26 | Disposition: A | Discharge status: Home / Self Care | Payer: Self-pay | Source: Ambulatory Visit | Attending: Family Medicine | Admitting: Family Medicine

## 2017-11-26 DIAGNOSIS — R079 Chest pain, unspecified: Secondary | ICD-10-CM

## 2017-11-26 NOTE — Telephone Encounter (Signed)
See note  Copied from [place] [id number]. Topic: General - Other >> Nov 26, 2017  9:33 AM Ivar Drape wrote: Reason for CRM:  Patient is asking for a call back to discuss the CT test she had orders for.

## 2017-11-26 NOTE — Progress Notes (Signed)
Reviewed and agree with documentation and assessment and plan. K. Veena Wendal Wilkie , MD   

## 2017-11-26 NOTE — Telephone Encounter (Signed)
Patient called had CT done today she wanted to let you know that the Ct would not go up to lump under arm. She was not sure if you wanted to order another test. She is due for mammo wanted to know if you wanted to have her do diagnostic mammo to see if we can have checked with that.

## 2017-11-27 ENCOUNTER — Other Ambulatory Visit: Payer: Self-pay | Admitting: Certified Nurse Midwife

## 2017-11-27 ENCOUNTER — Other Ambulatory Visit: Payer: Self-pay

## 2017-11-27 DIAGNOSIS — R591 Generalized enlarged lymph nodes: Secondary | ICD-10-CM

## 2017-11-27 DIAGNOSIS — Z1231 Encounter for screening mammogram for malignant neoplasm of breast: Secondary | ICD-10-CM

## 2017-11-27 NOTE — Telephone Encounter (Signed)
Called patient app made referral placed

## 2017-11-27 NOTE — Telephone Encounter (Signed)
Let's ask Dr. Paulla Fore to take a look at her as we discussed in office.

## 2017-11-28 ENCOUNTER — Ambulatory Visit (AMBULATORY_SURGERY_CENTER): Payer: BLUE CROSS/BLUE SHIELD | Admitting: Gastroenterology

## 2017-11-28 ENCOUNTER — Encounter: Payer: Self-pay | Admitting: Gastroenterology

## 2017-11-28 VITALS — BP 103/50 | HR 69 | Temp 96.8°F | Resp 10 | Ht 66.0 in | Wt 140.0 lb

## 2017-11-28 DIAGNOSIS — K625 Hemorrhage of anus and rectum: Secondary | ICD-10-CM

## 2017-11-28 DIAGNOSIS — R194 Change in bowel habit: Secondary | ICD-10-CM | POA: Diagnosis not present

## 2017-11-28 MED ORDER — SODIUM CHLORIDE 0.9 % IV SOLN: 500.0000 mL | Freq: Once | INTRAVENOUS | Status: DC

## 2017-11-28 MED ORDER — MESALAMINE 1000 MG RE SUPP: 1000.0000 mg | Freq: Every day | RECTAL | 0 refills | Status: DC

## 2017-11-28 NOTE — Progress Notes (Signed)
Report to PACU, RN, vss, BBS= Clear.  

## 2017-11-28 NOTE — Op Note (Signed)
[place] Patient Name: Alison Fleming Procedure Date: 11/28/2017 8:08 AM MRN: [id number] Endoscopist: Mauri Pole , MD Age: 42 Referring MD:  Date of Birth: 12-09-1975 Gender: Female Account #: [id number] Procedure:                Colonoscopy Indications:              Evaluation of unexplained GI bleeding, Change in                            bowel habits, Change in stool caliber, Rectal pain Medicines:                Monitored Anesthesia Care Procedure:                Pre-Anesthesia Assessment:                           - Prior to the procedure, a History and Physical                            was performed, and patient medications and                            allergies were reviewed. The patient's tolerance of                            previous anesthesia was also reviewed. The risks                            and benefits of the procedure and the sedation                            options and risks were discussed with the patient.                            All questions were answered, and informed consent                            was obtained. Prior Anticoagulants: The patient has                            taken no previous anticoagulant or antiplatelet                            agents. ASA Grade Assessment: II - A patient with                            mild systemic disease. After reviewing the risks                            and benefits, the patient was deemed in                            satisfactory condition to undergo the procedure.  After obtaining informed consent, the colonoscope                            was passed under direct vision. Throughout the                            procedure, the patient's blood pressure, pulse, and                            oxygen saturations were monitored continuously. The                            Colonoscope was introduced through the anus and   advanced to the the cecum, identified by                            appendiceal orifice and ileocecal valve. The                            colonoscopy was performed without difficulty. The                            patient tolerated the procedure well. The quality                            of the bowel preparation was excellent. The                            ileocecal valve, appendiceal orifice, and rectum                            were photographed. Scope In: 8:13:35 AM Scope Out: 8:32:06 AM Scope Withdrawal Time: 0 hours 12 minutes 46 seconds  Total Procedure Duration: 0 hours 18 minutes 31 seconds  Findings:                 The perianal and digital rectal examinations were                            normal.                           The terminal ileum contained a single localized                            bleeding erosion. Biopsies were taken with a cold                            forceps for histology.                           Patchy mild inflammation characterized by adherent                            blood, congestion (edema), erosions, erythema and  friability was found in the rectum. Biopsies were                            taken with a cold forceps for histology.                           Otherwise rest of the entire examined colon                            appeared normal. Biopsies were taken with a cold                            forceps for histology from right and left colon.                           Non-bleeding internal hemorrhoids were found during                            retroflexion. The hemorrhoids were medium-sized. Complications:            No immediate complications. Estimated Blood Loss:     Estimated blood loss was minimal. Impression:               - A single erosion in the terminal ileum. Biopsied.                           - Patchy mild inflammation was found in the rectum                            secondary to proctitis.  Biopsied.                           - The entire examined colon is normal. Biopsied.                           - Non-bleeding internal hemorrhoids. Recommendation:           - Patient has a contact number available for                            emergencies. The signs and symptoms of potential                            delayed complications were discussed with the                            patient. Return to normal activities tomorrow.                            Written discharge instructions were provided to the                            patient.                           - Resume  previous diet.                           - Continue present medications.                           - Await pathology results.                           - Repeat colonoscopy in 10 years for surveillance                            based on pathology results.                           - Avoid NSAID's                           - Use Canasa 1000 mg suppository 1 per rectum QHS                            for 2 weeks.                           - Return to GI office next available appointment in                            2-4 weeks Mauri Pole, MD 11/28/2017 8:43:24 AM This report has been signed electronically.

## 2017-11-28 NOTE — Patient Instructions (Signed)
YOU HAD AN ENDOSCOPIC PROCEDURE TODAY AT [facility]:   Refer to the procedure report that was given to you for any specific questions about what was found during the examination.  If the procedure report does not answer your questions, please call your gastroenterologist to clarify.  If you requested that your care partner not be given the details of your procedure findings, then the procedure report has been included in a sealed envelope for you to review at your convenience later.  YOU SHOULD EXPECT: Some feelings of bloating in the abdomen. Passage of more gas than usual.  Walking can help get rid of the air that was put into your GI tract during the procedure and reduce the bloating. If you had a lower endoscopy (such as a colonoscopy or flexible sigmoidoscopy) you may notice spotting of blood in your stool or on the toilet paper. If you underwent a bowel prep for your procedure, you may not have a normal bowel movement for a few days.  Please Note:  You might notice some irritation and congestion in your nose or some drainage.  This is from the oxygen used during your procedure.  There is no need for concern and it should clear up in a day or so.  SYMPTOMS TO REPORT IMMEDIATELY:   Following lower endoscopy (colonoscopy or flexible sigmoidoscopy):  Excessive amounts of blood in the stool  Significant tenderness or worsening of abdominal pains  Swelling of the abdomen that is new, acute  Fever of 100F or higher   For urgent or emergent issues, a gastroenterologist can be reached at any hour by calling [phone].   DIET:  We do recommend a small meal at first, but then you may proceed to your regular diet.  Drink plenty of fluids but you should avoid alcoholic beverages for 24 hours.  ACTIVITY:  You should plan to take it easy for the rest of today and you should NOT DRIVE or use heavy machinery until tomorrow (because of the sedation medicines used during the test).     FOLLOW UP: Our staff will call the number listed on your records the next business day following your procedure to check on you and address any questions or concerns that you may have regarding the information given to you following your procedure. If we do not reach you, we will leave a message.  However, if you are feeling well and you are not experiencing any problems, there is no need to return our call.  We will assume that you have returned to your regular daily activities without incident.  If any biopsies were taken you will be contacted by phone or by letter within the next 1-3 weeks.  Please call us at [phone] if you have not heard about the biopsies in 3 weeks.    SIGNATURES/CONFIDENTIALITY: You and/or your care partner have signed paperwork which will be entered into your electronic medical record.  These signatures attest to the fact that that the information above on your After Visit Summary has been reviewed and is understood.  Full responsibility of the confidentiality of this discharge information lies with you and/or your care-partner.   Handout was given to your care partner on hemorrhoids. You may resume your current medications today. Rx was sent to [facility] for suppository. Await biopsy results. Follow up appointment in the office in 2-4 weeks.  The office will call you with this appointment. Please call if any questions or concerns.

## 2017-11-28 NOTE — Progress Notes (Signed)
No problems noted in the recovery room. maw 

## 2017-11-28 NOTE — Progress Notes (Signed)
Called to room to assist during endoscopic procedure.  Patient ID and intended procedure confirmed with present staff. Received instructions for my participation in the procedure from the performing physician.  

## 2017-11-29 ENCOUNTER — Telehealth: Payer: Self-pay | Admitting: *Deleted

## 2017-11-29 NOTE — Telephone Encounter (Signed)
  Follow up Call-  Call back number 11/28/2017 03/08/2017  Post procedure Call Back phone  # [phone]  Permission to leave phone message Yes Yes  Some recent data might be hidden     Patient questions:  Do you have a fever, pain , or abdominal swelling? No. Pain Score  0 *  Have you tolerated food without any problems? Yes.    Have you been able to return to your normal activities? Yes.    Do you have any questions about your discharge instructions: Diet   No. Medications  No. Follow up visit  No.  Do you have questions or concerns about your Care? No.  Actions: * If pain score is 4 or above: No action needed, pain <4.

## 2017-11-30 ENCOUNTER — Ambulatory Visit (INDEPENDENT_AMBULATORY_CARE_PROVIDER_SITE_OTHER): Payer: BLUE CROSS/BLUE SHIELD | Admitting: Sports Medicine

## 2017-11-30 ENCOUNTER — Ambulatory Visit: Payer: Self-pay

## 2017-11-30 ENCOUNTER — Encounter: Payer: Self-pay | Admitting: Sports Medicine

## 2017-11-30 VITALS — BP 102/70 | HR 86 | Ht 66.0 in | Wt 142.2 lb

## 2017-11-30 DIAGNOSIS — M25511 Pain in right shoulder: Secondary | ICD-10-CM | POA: Diagnosis not present

## 2017-11-30 NOTE — Progress Notes (Signed)
Alison Fleming. Alison Fleming, [name] [facility] [place] - 42 y.o. female MRN [id number]  Date of birth: October 21, 1975  Visit Date: 11/30/2017  PCP: Alison Deutscher, DO   Referred by: Alison Deutscher, DO   Scribe(s) for today's visit: Wendy Poet, LAT, ATC  SUBJECTIVE:  Alison Fleming is here for New Patient (Initial Visit) (R underarm pain)  Referred by: Dr. Juleen Fleming  HPI: Her R underarm pain symptoms INITIALLY: Began about a year ago w/ no known MOI but the lump has gotten larger. Described as no tenderness to palpation and no pain otherwise, nonradiating Worsened with nothing noted Improved with nothing noted Additional associated symptoms include: no other associated symptoms    At this time symptoms are worsening compared to onset.  She reports this is enlarging over time.  C-spine MRI - 09/08/17 T- and L-spine CT - 08/24/17  REVIEW OF SYSTEMS: Denies night time disturbances. Reports fevers, chills, or night sweats.  Occasional night sweats. Denies unexplained weight loss. Denies personal history of cancer. Reports changes in bowel or bladder habits. Denies recent unreported falls. Denies new or worsening dyspnea or wheezing. Denies headaches or dizziness.  Reports numbness, tingling or weakness  In the extremities - B hand tingling Denies dizziness or presyncopal episodes Denies lower extremity edema    HISTORY:  Prior history reviewed and updated per electronic medical record.  Social History   Occupational History  . Occupation: HUMAN RESOURCES    Employer: [employer]  Tobacco Use  . Smoking status: Never Smoker  . Smokeless tobacco: Never Used  Substance and Sexual Activity  . Alcohol use: Yes    Alcohol/week: 1.0 - 2.0 standard drinks    Types: 1 - 2 Standard drinks or equivalent per week  . Drug use: No  . Sexual activity: Yes    Partners: Male    Birth control/protection: OCP     Social History   Social History Narrative  . Not on file    Past Medical History:  Diagnosis Date  . 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 Anxiety   . Atypical chest pain 04/24/2017  . Dyspepsia, s/p normal EGD 03/08/17, with recommendation for lifetime anti-reflux regimen 03/26/2017  . GAD (generalized anxiety disorder) 05/10/2016  . History of spinal fusion for scoliosis 03/26/2017  . Left upper quadrant pain 03/26/2017  . Malaise and fatigue 03/26/2017  . Paresthesias/numbness 04/24/2017  . Polyarthralgia 03/28/2017  . Positive Lyme disease serology   . Seasonal allergies   . Tension headache 03/26/2017   Past Surgical History:  Procedure Laterality Date  . DILATATION & CURRETTAGE/HYSTEROSCOPY WITH RESECTOCOPE  02/12/2012   Procedure: [place];  Surgeon: Azalia Bilis, MD;  Location: [facility];  Service: Gynecology;  Laterality: N/A;  . DILATION AND CURETTAGE OF UTERUS  2010  . DILATION AND CURETTAGE OF UTERUS  2012  . SPINAL FUSION  2008   family history includes Anxiety disorder in her mother; Diabetes in her maternal grandfather, maternal grandmother, and mother; Heart attack in her maternal grandfather and maternal grandmother; Heart disease in her maternal grandfather and maternal grandmother; Heart murmur in her brother; Hypertension in her maternal grandfather, maternal grandmother, and mother; Osteoporosis in her maternal grandmother. There is no history of Colon cancer, Esophageal cancer, Rectal cancer, or Stomach cancer.  DATA OBTAINED & REVIEWED:  No results for input(s): HGBA1C, LABURIC, CREATINE in the last 8760 hours. No problems updated. . MSK ultrasound 11/30/2017  shows likely trigger point in the teres major musculature with normal axilla otherwise.  OBJECTIVE:  VS:  HT:5' 6"  (167.6 cm)   WT:142 lb 3.2 oz (64.5 kg)  BMI:22.96    BP:102/70  HR:86bpm  TEMP: ( )  RESP:98 %   PHYSICAL EXAM: CONSTITUTIONAL: Well-developed,  Well-nourished and In no acute distress PSYCHIATRIC: Alert & appropriately interactive. and Not depressed or anxious appearing. RESPIRATORY: No increased work of breathing and Trachea Midline EYES: Pupils are equal., EOM intact without nystagmus. and No scleral icterus.  VASCULAR EXAM: Warm and well perfused NEURO: unremarkable  MSK Exam: Right axilla  Well aligned, no significant deformity. No overlying skin changes. TTP over Posterior capsule.  There is a questionable area of firmness but no overt cystic changes within the posterior capsule   RANGE OF MOTION & STRENGTH  full overhead ROM   SPECIALITY TESTING:  Full overhead range of motion of the shoulder with good intrinsic rotator cuff strength.  She has a small amount of pain with external rotation is mild.     ASSESSMENT   1. Right shoulder pain, unspecified chronicity     PLAN:  Pertinent additional documentation may be included in corresponding procedure notes, imaging studies, problem based documentation and patient instructions.  Procedures:  . None  Medications:  No orders of the defined types were placed in this encounter.  Discussion/Instructions: No problem-specific Assessment & Plan notes found for this encounter.  . THERAPEUTIC EXERCISE: Discussed the foundation of treatment for this condition is physical therapy and/or daily (5-6 days/week) therapeutic exercises, focusing on core strengthening, coordination, neuromuscular control/reeducation.  Home Therapeutic exercises prescribed today per procedure note. . Discussed red flag symptoms that warrant earlier emergent evaluation and patient voices understanding. . Activity modifications and the importance of avoiding exacerbating activities (limiting pain to no more than a 4 / 10 during or following activity) recommended and discussed. [name] Ultimately findings are reassuring for no significant structural cause and ultimately the nodule that she feels is likely  reflective of an underlying trigger point.  If any lack of improvement can consider referral to physical therapy for dry needling and or formal diagnostic vascular ultrasound if needed.  Follow-up:  . No follow-ups on file.  . If any lack of improvement: consider referral to Physical Therapy . At follow up will plan: to consider initial osteopathic manipulation     CMA/ATC served as scribe during this visit. History, Physical, and Plan performed by medical provider. Documentation and orders reviewed and attested to.      Gerda Diss, [facility] Physician

## 2017-11-30 NOTE — Procedures (Signed)
LIMITED MSK ULTRASOUND OF Right axilla Images were obtained and interpreted by myself, Teresa Coombs, DO  Images have been saved and stored to PACS system. Images obtained on: GE S7 Ultrasound machine  FINDINGS:   No appreciable lymphadenopathy or significant masses there is a focal area of muscle spasm within the teres major musculature directly over the humeral head.  There are no appreciable lymph nodes to the area of nodularity normal-appearing musculature other than slight dysfunctional appearance.  IMPRESSION:  1. Likely trigger point of the teres major muscle without evidence of lymphadenopathy.

## 2017-11-30 NOTE — Progress Notes (Signed)

## 2017-11-30 NOTE — Patient Instructions (Signed)
Please perform the exercise program that we have prepared for you and gone over in detail on a daily basis.  In addition to the handout you were provided you can access your program through: [email or web address]   Your unique program code is: Ardean Larsen

## 2017-12-06 ENCOUNTER — Encounter: Payer: Self-pay | Admitting: Gastroenterology

## 2017-12-12 NOTE — Progress Notes (Deleted)
Alison Fleming is a 42 y.o. female is here for follow up.  History of Present Illness:   {CMA SCRIBE ATTESTATION}  HPI:  Dysfunctional uterine bleeding, with normal [place] Comments: Contiues OCP.  Change in bowel habits Comments: Upcoming colonoscopy.   Chest pain, unspecified type Comments: Cardiac CT.  Orders:  There are no preventive care reminders to display for this patient. Depression screen [facility] 2/9 05/18/2017 10/12/2016  Decreased Interest 1 0  Down, Depressed, Hopeless 0 0  PHQ - 2 Score 1 0  Altered sleeping 2 -  Tired, decreased energy 2 -  Change in appetite 3 -  Feeling bad or failure about yourself  0 -  Trouble concentrating 3 -  Moving slowly or fidgety/restless 0 -  Suicidal thoughts 0 -  PHQ-9 Score 11 -  Difficult doing work/chores Somewhat difficult -   PMHx, SurgHx, SocialHx, FamHx, Medications, and Allergies were reviewed in the Visit Navigator and updated as appropriate.   Patient Active Problem List   Diagnosis Date Noted  . Dysfunctional uterine bleeding, with normal [place] 11/16/2017  . Change in bowel habits 11/16/2017  . Palpitation 04/30/2017  . Paresthesias/numbness 04/24/2017  . Polyarthralgia 03/28/2017  . Dyspepsia, s/p normal EGD 03/08/17, with recommendation for lifetime anti-reflux regimen 03/26/2017  . History of spinal fusion for scoliosis 03/26/2017  . Malaise and fatigue 03/26/2017  . Tension headache 03/26/2017  . 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 GAD (generalized anxiety disorder) 05/10/2016   Social History   Tobacco Use  . Smoking status: Never Smoker  . Smokeless tobacco: Never Used  Substance Use Topics  . Alcohol use: Yes    Alcohol/week: 1.0 - 2.0 standard drinks    Types: 1 - 2 Standard drinks or equivalent per week  . Drug use: No   Current Medications and Allergies:   Current Outpatient Medications:  .  cholecalciferol (VITAMIN D) 1000 units tablet, Take 1,000 Units by mouth daily., Disp: , Rfl:  .   CRANBERRY PO, Take by mouth every other day., Disp: , Rfl:  .  cyanocobalamin 100 MCG tablet, Take 100 mcg by mouth every other day. , Disp: , Rfl:  .  mesalamine (CANASA) 1000 MG suppository, Place 1 suppository (1,000 mg total) rectally at bedtime., Disp: 14 suppository, Rfl: 0 .  naproxen sodium (ALEVE) 220 MG tablet, Take 1 tablet (220 mg total) by mouth daily as needed. (Patient taking differently: Take 220 mg by mouth daily. ), Disp: , Rfl:  .  Norethindrone Acetate-Ethinyl Estradiol (MICROGESTIN) 1.5-30 MG-MCG tablet, Take 1 tablet by mouth daily., Disp: 3 Package, Rfl: 4 .  omeprazole (PRILOSEC) 40 MG capsule, Take 1 capsule (40 mg total) by mouth daily., Disp: 30 capsule, Rfl: 3 .  Prenatal Vit-Fe Fumarate-FA (MULTIVITAMIN-PRENATAL) 27-0.8 MG TABS, Take 1 tablet by mouth daily., Disp: , Rfl:  .  valACYclovir (VALTREX) 1000 MG tablet, Take 1 tablet (1,000 mg total) by mouth 2 (two) times daily. (Patient taking differently: Take 1,000 mg by mouth 2 (two) times daily as needed. As needed), Disp: 20 tablet, Rfl: 0  Allergies  Allergen Reactions  . Zoloft [Sertraline Hcl] Other (See Comments)    Sedation   Review of Systems   Pertinent items are noted in the HPI. Otherwise, ROS is negative.  Vitals:  There were no vitals filed for this visit.   There is no height or weight on file to calculate BMI.  Physical Exam:   Physical Exam  Results for orders placed or performed in visit on  10/04/17  Ova and parasite examination  Result Value Ref Range   MICRO NUMBER: [id number]    SPECIMEN QUALITY: ADEQUATE    Source STOOL    STATUS: FINAL    CONCENTRATE RESULT: No ova or parasites seen    TRICHROME RESULT: No ova or parasites seen    COMMENT:      Routine Ova and Parasite exam may not detect some parasites that occasionally cause diarrheal illness. Test code(s) 92957 (Cryptosporidium Ag., DFA) and/or 10018 (Cyclospora and Isospora Exam) may be ordered to detect these parasites. One  negative sample  does not necessarily rule out the presence of a parasitic infection.  For additional information, please refer to https://education.quest[facility].com/faq/FAQ203 (This link is being provided for informational/ educational purposes only.)   Stool Culture  Result Value Ref Range   MICRO NUMBER: [id number]    SPECIMEN QUALITY: ADEQUATE    SOURCE: STOOL    STATUS: FINAL    SHIGA RESULT: Not Detected    MICRO NUMBER: [id number]    SPECIMEN QUALITY: ADEQUATE    Source STOOL    STATUS: FINAL    CAM RESULT: No enteric Campylobacter isolated    MICRO NUMBER: [id number]    SPECIMEN QUALITY: ADEQUATE    SOURCE: STOOL    STATUS: FINAL    SS RESULT: No Salmonella or Shigella isolated   Gastrointestinal Pathogen Panel PCR  Result Value Ref Range   Campylobacter, PCR NOT DETECTED NOT DETECT   C. difficile Tox A/B, PCR NOT DETECTED NOT DETECT   E coli 0157, PCR NOT DETECTED NOT DETECT   E coli (ETEC) LT/ST PCR NOT DETECTED NOT DETECT   E coli (STEC) stx1/stx2, PCR NOT DETECTED NOT DETECT   Salmonella, PCR NOT DETECTED NOT DETECT   Shigella, PCR NOT DETECTED NOT DETECT   Norovirus, PCR NOT DETECTED NOT DETECT   Rotavirus A, PCR NOT DETECTED NOT DETECT   Giardia lamblia, PCR NOT DETECTED NOT DETECT   Cryptosporidium, PCR NOT DETECTED NOT DETECT  TIQ-NTM  Result Value Ref Range   QUESTION/PROBLEM:     SPECIMEN(S) RECEIVED: CB,TF,FF     Assessment and Plan:   Diagnoses and all orders for this visit:  GAD (generalized anxiety disorder)    . Reviewed expectations re: course of current medical issues. . Discussed self-management of symptoms. . Outlined signs and symptoms indicating need for more acute intervention. . Patient verbalized understanding and all questions were answered. [name] Health Maintenance issues including appropriate healthy diet, exercise, and smoking avoidance were discussed with patient. . See orders for this visit as documented in the electronic medical  record. . Patient received an After Visit Summary.  *** CMA served as Education administrator during this visit. History, Physical, and Plan performed by medical provider. The above documentation has been reviewed and is accurate and complete. [name], [place], [place], [place] [facility] 12/12/2017

## 2017-12-17 ENCOUNTER — Ambulatory Visit: Payer: BLUE CROSS/BLUE SHIELD | Admitting: Family Medicine

## 2017-12-21 ENCOUNTER — Ambulatory Visit (INDEPENDENT_AMBULATORY_CARE_PROVIDER_SITE_OTHER): Payer: BLUE CROSS/BLUE SHIELD | Admitting: Physician Assistant

## 2017-12-21 ENCOUNTER — Encounter: Payer: Self-pay | Admitting: Physician Assistant

## 2017-12-21 VITALS — BP 110/70 | HR 72 | Ht 66.0 in | Wt 146.0 lb

## 2017-12-21 DIAGNOSIS — R194 Change in bowel habit: Secondary | ICD-10-CM | POA: Diagnosis not present

## 2017-12-21 DIAGNOSIS — K648 Other hemorrhoids: Secondary | ICD-10-CM | POA: Diagnosis not present

## 2017-12-21 MED ORDER — HYDROCORTISONE 1 % EX OINT: 1.0000 "application " | TOPICAL_OINTMENT | Freq: Two times a day (BID) | CUTANEOUS | 1 refills | Status: DC

## 2017-12-21 NOTE — Patient Instructions (Signed)
If you are age 41 or older, your body mass index should be between 23-30. Your Body mass index is 23.57 kg/m. If this is out of the aforementioned range listed, please consider follow up with your Primary Care Provider.  If you are age 41 or younger, your body mass index should be between 19-25. Your Body mass index is 23.57 kg/m. If this is out of the aformentioned range listed, please consider follow up with your Primary Care Provider.   We have sent the following medications to your pharmacy for you to pick up at your convenience: Hydrocortisone Ointment  Follow up as needed.  Thank you for choosing me and [facility].   Ellouise Newer, PA-C

## 2017-12-21 NOTE — Progress Notes (Signed)
Chief Complaint: Follow-up change in bowel habits  HPI:    [name] is a 42 year old Caucasian female with a past medical history as listed below, known to Dr. Silverio Decamp, who returns to clinic today for follow-up of her change in bowel habits.    11/28/2017 colonoscopy with Dr. Silverio Decamp with a single erosion in the terminal ileum, patchy mild inflammation in the rectum secondary to proctitis and the entire examined colon was otherwise normal, nonbleeding internal hemorrhoids.  Patient was prescribed Canasa suppositories thousand milligrams 1 per rectum nightly for 2 weeks.  Repeat recommended in 10 years.  Pathology showed no specific histopathologic changes.    Today, the patient explains that she is doing much much better.  She no longer has messy stools and they are coming out together.  She does not have to wipe 10 times after having a bowel movement to get clean and is not having any further rectal pressure/back discomfort.  Tells me she is still not back to her regular scheduled bowel movements once every day but is at least 95% better after using the suppositories.      Does tell me that recently she has noticed that her hemorrhoids are more "out than in".  Prior to all of this the patient only had trouble when she would strain for a bowel movement but now tells me that sometimes she feels them in the shower.    Denies fever, chills, weight loss, ataxia, nausea, vomiting, rectal bleeding or symptoms that awaken her from sleep.  Past Medical History:  Diagnosis Date  . 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 Anxiety   . Atypical chest pain 04/24/2017  . Dyspepsia, s/p normal EGD 03/08/17, with recommendation for lifetime anti-reflux regimen 03/26/2017  . GAD (generalized anxiety disorder) 05/10/2016  . History of spinal fusion for scoliosis 03/26/2017  . Left upper quadrant pain 03/26/2017  . Malaise and fatigue 03/26/2017  . Paresthesias/numbness 04/24/2017  . Polyarthralgia 03/28/2017  . Positive  Lyme disease serology   . Seasonal allergies   . Tension headache 03/26/2017    Past Surgical History:  Procedure Laterality Date  . DILATATION & CURRETTAGE/HYSTEROSCOPY WITH RESECTOCOPE  02/12/2012   Procedure: [place];  Surgeon: Azalia Bilis, MD;  Location: [facility];  Service: Gynecology;  Laterality: N/A;  . DILATION AND CURETTAGE OF UTERUS  2010  . DILATION AND CURETTAGE OF UTERUS  2012  . SPINAL FUSION  2008    Current Outpatient Medications  Medication Sig Dispense Refill  . cholecalciferol (VITAMIN D) 1000 units tablet Take 1,000 Units by mouth daily.    [name] CRANBERRY PO Take by mouth every other day.    . cyanocobalamin 100 MCG tablet Take 100 mcg by mouth every other day.     . mesalamine (CANASA) 1000 MG suppository Place 1 suppository (1,000 mg total) rectally at bedtime. 14 suppository 0  . Norethindrone Acetate-Ethinyl Estradiol (MICROGESTIN) 1.5-30 MG-MCG tablet Take 1 tablet by mouth daily. 3 Package 4  . omeprazole (PRILOSEC) 40 MG capsule Take 1 capsule (40 mg total) by mouth daily. 30 capsule 3  . Prenatal Vit-Fe Fumarate-FA (MULTIVITAMIN-PRENATAL) 27-0.8 MG TABS Take 1 tablet by mouth daily.    . valACYclovir (VALTREX) 1000 MG tablet Take 1 tablet (1,000 mg total) by mouth 2 (two) times daily. (Patient taking differently: Take 1,000 mg by mouth 2 (two) times daily as needed. As needed) 20 tablet 0  . hydrocortisone 1 % ointment Apply 1 application topically 2 (two) times daily.  Use for 14 days. 30 g 1   No current facility-administered medications for this visit.     Allergies as of 12/21/2017 - Review Complete 11/30/2017  Allergen Reaction Noted  . Zoloft [sertraline hcl] Other (See Comments) 03/28/2017    Family History  Problem Relation Age of Onset  . Diabetes Maternal Grandmother   . Hypertension Maternal Grandmother   . Heart disease Maternal Grandmother   . Heart attack Maternal Grandmother   . Osteoporosis  Maternal Grandmother   . Diabetes Maternal Grandfather   . Hypertension Maternal Grandfather   . Heart disease Maternal Grandfather   . Heart attack Maternal Grandfather   . Hypertension Mother   . Anxiety disorder Mother   . Diabetes Mother   . Heart murmur Brother   . Colon Fleming Neg Hx   . Esophageal Fleming Neg Hx   . Rectal Fleming Neg Hx   . Stomach Fleming Neg Hx     Social History   Socioeconomic History  . Marital status: Married    Spouse name: Not on file  . Number of children: 2  . Years of education: Not on file  . Highest education level: Not on file  Occupational History  . Occupation: HUMAN RESOURCES    Employer: [employer]  Social Needs  . Financial resource strain: Not on file  . Food insecurity:    Worry: Not on file    Inability: Not on file  . Transportation needs:    Medical: Not on file    Non-medical: Not on file  Tobacco Use  . Smoking status: Never Smoker  . Smokeless tobacco: Never Used  Substance and Sexual Activity  . Alcohol use: Yes    Alcohol/week: 1.0 - 2.0 standard drinks    Types: 1 - 2 Standard drinks or equivalent per week  . Drug use: No  . Sexual activity: Yes    Partners: Male    Birth control/protection: OCP  Lifestyle  . Physical activity:    Days per week: Not on file    Minutes per session: Not on file  . Stress: Not on file  Relationships  . Social connections:    Talks on phone: Not on file    Gets together: Not on file    Attends religious service: Not on file    Active member of club or organization: Not on file    Attends meetings of clubs or organizations: Not on file    Relationship status: Not on file  . Intimate partner violence:    Fear of current or ex partner: Not on file    Emotionally abused: Not on file    Physically abused: Not on file    Forced sexual activity: Not on file  Other Topics Concern  . Not on file  Social History Narrative  . Not on file    Review of Systems:     Constitutional: No weight loss, fever or chills Cardiovascular: No chest pain Respiratory: No SOB  Gastrointestinal: See HPI and otherwise negative   Physical Exam:  Vital signs: BP 110/70   Pulse 72   Ht 5' 6"  (1.676 m)   Wt 146 lb (66.2 kg)   LMP 11/25/2017   BMI 23.57 kg/m   Constitutional:   Pleasant Caucasian female appears to be in NAD, Well developed, Well nourished, alert and cooperative Respiratory: Respirations even and unlabored. Lungs clear to auscultation bilaterally.   No wheezes, crackles, or rhonchi.  Cardiovascular: Normal S1, S2. No  MRG. Regular rate and rhythm. No peripheral edema, cyanosis or pallor.  Gastrointestinal:  Soft, nondistended, nontender. No rebound or guarding. Normal bowel sounds. No appreciable masses or hepatomegaly. Psychiatric: Demonstrates good judgement and reason without abnormal affect or behaviors.  No recent labs.  Assessment: 1.  Change in bowel habits: Colonoscopy showing nonspecific proctitis, much improvement after using Canasa suppositories nightly for 2 weeks 2.  Internal hemorrhoids: Irritated recently after suppository use and colonoscopy  Plan: 1.  Discussed with patient that she may need to repeat the Canasa suppositories if she has recurrence of symptoms at any point in the future.  This was a nonspecific proctitis, biopsy showing no specific histopathologic changes.  This was discussed.  Questions were answered. 2.  Prescribed Hydrocortisone ointment to be applied to hemorrhoids twice daily x2 weeks with 1 refill. 3.  Patient to follow in clinic with Dr. Silverio Decamp or myself as needed in the future.  Ellouise Newer, PA-C [facility] 12/21/2017, 8:41 AM  Cc: [name], DO

## 2017-12-25 ENCOUNTER — Encounter: Payer: Self-pay | Admitting: Family Medicine

## 2017-12-25 ENCOUNTER — Ambulatory Visit (INDEPENDENT_AMBULATORY_CARE_PROVIDER_SITE_OTHER): Payer: BLUE CROSS/BLUE SHIELD | Admitting: Family Medicine

## 2017-12-25 VITALS — BP 110/68 | HR 81 | Temp 98.6°F | Ht 66.0 in | Wt 146.4 lb

## 2017-12-25 DIAGNOSIS — M2550 Pain in unspecified joint: Secondary | ICD-10-CM

## 2017-12-25 DIAGNOSIS — N938 Other specified abnormal uterine and vaginal bleeding: Secondary | ICD-10-CM | POA: Diagnosis not present

## 2017-12-25 DIAGNOSIS — Z13220 Encounter for screening for lipoid disorders: Secondary | ICD-10-CM | POA: Diagnosis not present

## 2017-12-25 DIAGNOSIS — E559 Vitamin D deficiency, unspecified: Secondary | ICD-10-CM | POA: Diagnosis not present

## 2017-12-25 DIAGNOSIS — F411 Generalized anxiety disorder: Secondary | ICD-10-CM

## 2017-12-25 DIAGNOSIS — R5381 Other malaise: Secondary | ICD-10-CM | POA: Diagnosis not present

## 2017-12-25 DIAGNOSIS — R5383 Other fatigue: Secondary | ICD-10-CM

## 2017-12-25 DIAGNOSIS — IMO0001 Reserved for inherently not codable concepts without codable children: Secondary | ICD-10-CM

## 2017-12-25 DIAGNOSIS — R209 Unspecified disturbances of skin sensation: Secondary | ICD-10-CM

## 2017-12-25 LAB — COMPREHENSIVE METABOLIC PANEL
ALT: 17 U/L (ref 0–35)
AST: 16 U/L (ref 0–37)
Albumin: 4.4 g/dL (ref 3.5–5.2)
Alkaline Phosphatase: 39 U/L (ref 39–117)
BUN: 14 mg/dL (ref 6–23)
CO2: 27 mEq/L (ref 19–32)
Calcium: 9.1 mg/dL (ref 8.4–10.5)
Chloride: 103 mEq/L (ref 96–112)
Creatinine, Ser: 0.75 mg/dL (ref 0.40–1.20)
GFR: 90.5 mL/min (ref >60.00)
Glucose, Bld: 83 mg/dL (ref 70–99)
Potassium: 3.8 mEq/L (ref 3.5–5.1)
Sodium: 138 mEq/L (ref 135–145)
Total Bilirubin: 0.5 mg/dL (ref 0.2–1.2)
Total Protein: 7.1 g/dL (ref 6.0–8.3)

## 2017-12-25 LAB — CBC WITH DIFFERENTIAL/PLATELET
Basophils Absolute: 0.1 10*3/uL (ref 0.0–0.1)
Basophils Relative: 1.3 % (ref 0.0–3.0)
Eosinophils Absolute: 0.2 10*3/uL (ref 0.0–0.7)
Eosinophils Relative: 2.7 % (ref 0.0–5.0)
HCT: 38.8 % (ref 36.0–46.0)
Hemoglobin: 13.1 g/dL (ref 12.0–15.0)
Lymphocytes Relative: 24.5 % (ref 12.0–46.0)
Lymphs Abs: 1.4 10*3/uL (ref 0.7–4.0)
MCHC: 33.8 g/dL (ref 30.0–36.0)
MCV: 89.5 fl (ref 78.0–100.0)
Monocytes Absolute: 0.4 10*3/uL (ref 0.1–1.0)
Monocytes Relative: 7.3 % (ref 3.0–12.0)
Neutro Abs: 3.6 10*3/uL (ref 1.4–7.7)
Neutrophils Relative %: 64.2 % (ref 43.0–77.0)
Platelets: 296 10*3/uL (ref 150.0–400.0)
RBC: 4.33 Mil/uL (ref 3.87–5.11)
RDW: 12.9 % (ref 11.5–15.5)
WBC: 5.7 10*3/uL (ref 4.0–10.5)

## 2017-12-25 LAB — VITAMIN D 25 HYDROXY (VIT D DEFICIENCY, FRACTURES): VITD: 35.78 ng/mL (ref 30.00–100.00)

## 2017-12-25 LAB — VITAMIN B12: Vitamin B-12: 526 pg/mL (ref 211–911)

## 2017-12-25 LAB — LIPID PANEL
Cholesterol: 195 mg/dL (ref 0–200)
HDL: 61 mg/dL (ref >39.00)
LDL Cholesterol: 112 mg/dL — ABNORMAL HIGH (ref 0–99)
NonHDL: 133.74
Total CHOL/HDL Ratio: 3
Triglycerides: 111 mg/dL (ref 0.0–149.0)
VLDL: 22.2 mg/dL (ref 0.0–40.0)

## 2017-12-25 LAB — TSH: TSH: 1.09 u[IU]/mL (ref 0.35–4.50)

## 2017-12-25 LAB — T4, FREE: Free T4: 0.72 ng/dL (ref 0.60–1.60)

## 2017-12-25 NOTE — Progress Notes (Signed)
Alison Fleming is a 42 y.o. female is here for follow up.  History of Present Illness:   Alison Fleming, CMA acting as scribe for Dr. [name].   HPI: Patient in follow up.  Mass under arm. Evaluated by Dr. Paulla Fore. US showed that the mass was muscle tissue. She is reassured. [name] Dysfunctional uterine bleeding, with normal [place]. Has had improvement with OCP.  Proctitis. Colonoscopy was completed. Treated and much improved.  Atypical CP. Cardiac CT was done. Results reviewed with patient. She is still having some chest pains in same area as before off and on, but feels more confident that it is due to MSK etiology and/or anxiety.  Fatigue and paresthesias. Ongoing, chronic. No new symptoms. Focusing on healthy habits.   There are no preventive care reminders to display for this patient.   Depression screen [facility] 2/9 05/18/2017 10/12/2016  Decreased Interest 1 0  Down, Depressed, Hopeless 0 0  PHQ - 2 Score 1 0  Altered sleeping 2 -  Tired, decreased energy 2 -  Change in appetite 3 -  Feeling bad or failure about yourself  0 -  Trouble concentrating 3 -  Moving slowly or fidgety/restless 0 -  Suicidal thoughts 0 -  PHQ-9 Score 11 -  Difficult doing work/chores Somewhat difficult -   PMHx, SurgHx, SocialHx, FamHx, Medications, and Allergies were reviewed in the Visit Navigator and updated as appropriate.   Patient Active Problem List   Diagnosis Date Noted  . Dysfunctional uterine bleeding, with normal [place] 11/16/2017  . Change in bowel habits 11/16/2017  . Palpitation 04/30/2017  . Paresthesias/numbness 04/24/2017  . Polyarthralgia 03/28/2017  . Dyspepsia, s/p normal EGD 03/08/17, with recommendation for lifetime anti-reflux regimen 03/26/2017  . History of spinal fusion for scoliosis 03/26/2017  . Malaise and fatigue 03/26/2017  . Tension headache 03/26/2017  . 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 GAD (generalized anxiety disorder) 05/10/2016   Social History   Tobacco  Use  . Smoking status: Never Smoker  . Smokeless tobacco: Never Used  Substance Use Topics  . Alcohol use: Yes    Alcohol/week: 1.0 - 2.0 standard drinks    Types: 1 - 2 Standard drinks or equivalent per week  . Drug use: No   Current Medications and Allergies:   .  cholecalciferol (VITAMIN D) 1000 units tablet, Take 1,000 Units by mouth daily., Disp: , Rfl:  .  CRANBERRY PO, Take by mouth every other day., Disp: , Rfl:  .  cyanocobalamin 100 MCG tablet, Take 100 mcg by mouth every other day. , Disp: , Rfl:  .  hydrocortisone 1 % ointment, Apply 1 application topically 2 (two) times daily. Use for 14 days., Disp: 30 g, Rfl: 1 .  mesalamine (CANASA) 1000 MG suppository, Place 1 suppository (1,000 mg total) rectally at bedtime., Disp: 14 suppository, Rfl: 0 .  Norethindrone Acetate-Ethinyl Estradiol (MICROGESTIN) 1.5-30 MG-MCG tablet, Take 1 tablet by mouth daily., Disp: 3 Package, Rfl: 4 .  omeprazole (PRILOSEC) 40 MG capsule, Take 1 capsule (40 mg total) by mouth daily., Disp: 30 capsule, Rfl: 3 .  Prenatal Vit-Fe Fumarate-FA (MULTIVITAMIN-PRENATAL) 27-0.8 MG TABS, Take 1 tablet by mouth daily., Disp: , Rfl:  .  valACYclovir (VALTREX) 1000 MG tablet, Take 1 tablet (1,000 mg total) by mouth 2 (two) times daily. (Patient taking differently: Take 1,000 mg by mouth 2 (two) times daily as needed. As needed), Disp: 20 tablet, Rfl: 0   Allergies  Allergen Reactions  . Zoloft [Sertraline Hcl] Other (  See Comments)    Sedation   Review of Systems   Pertinent items are noted in the HPI. Otherwise, a complete ROS is negative.  Vitals:   Vitals:   12/25/17 0803  BP: 110/68  Pulse: 81  Temp: 98.6 F (37 C)  TempSrc: Oral  SpO2: 98%  Weight: 146 lb 6.4 oz (66.4 kg)  Height: 5' 6"  (1.676 m)     Body mass index is 23.63 kg/m.  Physical Exam:   Physical Exam Vitals signs and nursing note reviewed.  HENT:     Head: Normocephalic and atraumatic.  Eyes:     Pupils: Pupils are equal,  round, and reactive to light.  Neck:     Musculoskeletal: Normal range of motion and neck supple.  Cardiovascular:     Rate and Rhythm: Normal rate and regular rhythm.     Heart sounds: Normal heart sounds.  Pulmonary:     Effort: Pulmonary effort is normal.  Abdominal:     Palpations: Abdomen is soft.  Skin:    General: Skin is warm.  Psychiatric:        Behavior: Behavior normal.    Assessment and Plan:   Alison Fleming was seen today for follow-up.  Diagnoses and all orders for this visit:  Malaise and fatigue -     CBC with Differential/Platelet -     Comprehensive metabolic panel -     TSH -     T4, free -     Vitamin B12 -     Thyroid peroxidase antibody  Vitamin D deficiency -     VITAMIN D 25 Hydroxy (Vit-D Deficiency, Fractures)  GAD (generalized anxiety disorder)  Polyarthralgia -     CBC with Differential/Platelet -     Thyroid peroxidase antibody  Screening for lipid disorders -     Lipid panel  Dysfunctional uterine bleeding, with normal [place] -     CBC with Differential/Platelet -     Comprehensive metabolic panel -     TSH -     T4, free -     Thyroid peroxidase antibody  Paresthesias/numbness -     CBC with Differential/Platelet -     Comprehensive metabolic panel -     TSH -     T4, free -     Vitamin B12 -     Thyroid peroxidase antibody   . Orders and follow up as documented in [place], reviewed diet, exercise and weight control, cardiovascular risk and specific lipid/LDL goals reviewed, reviewed medications and side effects in detail.  . Reviewed expectations re: course of current medical issues. . Outlined signs and symptoms indicating need for more acute intervention. . Patient verbalized understanding and all questions were answered. . Patient received an After Visit Summary.  CMA served as Education administrator during this visit. History, Physical, and Plan performed by medical provider. The above documentation has been reviewed and is accurate and  complete. [name], D.O.  [name], DO [place] 12/30/2017

## 2017-12-26 ENCOUNTER — Encounter: Payer: BLUE CROSS/BLUE SHIELD | Admitting: Gastroenterology

## 2017-12-26 LAB — THYROID PEROXIDASE ANTIBODY: Thyroperoxidase Ab SerPl-aCnc: 2 IU/mL (ref <9)

## 2017-12-29 ENCOUNTER — Encounter: Payer: Self-pay | Admitting: Sports Medicine

## 2017-12-30 ENCOUNTER — Encounter: Payer: Self-pay | Admitting: Family Medicine

## 2018-01-07 ENCOUNTER — Ambulatory Visit
Admission: RE | Admit: 2018-01-07 | Discharge: 2018-01-07 | Disposition: A | Discharge status: Home / Self Care | Payer: BLUE CROSS/BLUE SHIELD | Source: Ambulatory Visit | Attending: Certified Nurse Midwife | Admitting: Certified Nurse Midwife

## 2018-01-07 DIAGNOSIS — Z1231 Encounter for screening mammogram for malignant neoplasm of breast: Secondary | ICD-10-CM | POA: Diagnosis not present

## 2018-01-10 ENCOUNTER — Other Ambulatory Visit: Payer: Self-pay | Admitting: Certified Nurse Midwife

## 2018-01-10 DIAGNOSIS — R928 Other abnormal and inconclusive findings on diagnostic imaging of breast: Secondary | ICD-10-CM

## 2018-01-15 ENCOUNTER — Ambulatory Visit
Admission: RE | Admit: 2018-01-15 | Discharge: 2018-01-15 | Disposition: A | Discharge status: Home / Self Care | Payer: BLUE CROSS/BLUE SHIELD | Source: Ambulatory Visit | Attending: Certified Nurse Midwife | Admitting: Certified Nurse Midwife

## 2018-01-15 DIAGNOSIS — N6322 Unspecified lump in the left breast, upper inner quadrant: Secondary | ICD-10-CM | POA: Diagnosis not present

## 2018-01-15 DIAGNOSIS — R928 Other abnormal and inconclusive findings on diagnostic imaging of breast: Secondary | ICD-10-CM

## 2018-01-15 DIAGNOSIS — R922 Inconclusive mammogram: Secondary | ICD-10-CM | POA: Diagnosis not present

## 2018-01-15 DIAGNOSIS — N6321 Unspecified lump in the left breast, upper outer quadrant: Secondary | ICD-10-CM | POA: Diagnosis not present

## 2018-01-22 NOTE — Progress Notes (Signed)
Reviewed and agree with documentation and assessment and plan. K. Veena Yocheved Depner , MD   

## 2018-02-05 ENCOUNTER — Ambulatory Visit (INDEPENDENT_AMBULATORY_CARE_PROVIDER_SITE_OTHER): Payer: 59 | Admitting: Physician Assistant

## 2018-02-05 ENCOUNTER — Encounter: Payer: Self-pay | Admitting: Physician Assistant

## 2018-02-05 VITALS — BP 140/68 | HR 95 | Temp 98.1°F | Ht 66.0 in | Wt 150.2 lb

## 2018-02-05 DIAGNOSIS — J0110 Acute frontal sinusitis, unspecified: Secondary | ICD-10-CM | POA: Diagnosis not present

## 2018-02-05 MED ORDER — PREDNISONE 5 MG PO TABS: ORAL_TABLET | ORAL | 0 refills | Status: DC

## 2018-02-05 MED ORDER — AMOXICILLIN-POT CLAVULANATE 875-125 MG PO TABS: 1.0000 | ORAL_TABLET | Freq: Two times a day (BID) | ORAL | 0 refills | Status: DC

## 2018-02-05 NOTE — Patient Instructions (Signed)
It was great to see you!  Start oral prednisone. Start oral antibiotic.  If nasal dryness persists, you can use a nasal saline spray.  Push fluids and get plenty of rest. Please return if you are not improving as expected, or if you have high fevers (>101.5) or difficulty swallowing or worsening productive cough.  Call clinic with questions.  I hope you start feeling better soon!  Contact a doctor if:  Your symptoms are not helped by medicine.  You have a headache that feels different than the other headaches.  You feel sick to your stomach (nauseous) or you throw up (vomit).  You have a fever. Get help right away if:  Your headache gets very bad quickly.  Your headache gets worse after a lot of physical activity.  You keep throwing up.  You have a stiff neck.  You have trouble seeing.  You have trouble speaking.  You have pain in the eye or ear.  Your muscles are weak or you lose muscle control.  You lose your balance or have trouble walking.  You feel like you will pass out (faint) or you pass out.  You are mixed up (confused).  You have a seizure. Summary  A headache is pain or discomfort that is felt around the head or neck area.  There are many causes and types of headaches. In some cases, the cause may not be found.  Keep a journal to help find out what causes your headaches. Watch your condition for any changes. Let your doctor know about them.  Contact a doctor if you have a headache that is different from usual, or if your headache is not helped by medicine.  Get help right away if your headache gets very bad, you throw up, you have trouble seeing, you lose your balance, or you have a seizure. This information is not intended to replace advice given to you by your health care provider. Make sure you discuss any questions you have with your health care provider.

## 2018-02-05 NOTE — Progress Notes (Signed)
Alison Fleming is a 43 y.o. female here for a new problem.  I acted as a Education administrator for [employer], PA-C Anselmo Pickler, LPN  History of Present Illness:   Chief Complaint  Patient presents with  . Sinus Problem    Sinus Problem  This is a new problem. Episode onset: Started about 4 weeks ago, but has been worse past week. The problem has been gradually worsening since onset. There has been no fever. Her pain is at a severity of 7/10. The pain is moderate. Associated symptoms include congestion (Nasal yellow draniage), coughing, ear pain, headaches, neck pain, sinus pressure and a sore throat. (Jaw pain teeth hurt when bending over.) Treatments tried: OTC Cold and Cough, Aspirin, Ibuprofen. The treatment provided no relief.    Past Medical History:  Diagnosis Date  . 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 Anxiety   . Atypical chest pain 04/24/2017  . Dyspepsia, s/p normal EGD 03/08/17, with recommendation for lifetime anti-reflux regimen 03/26/2017  . GAD (generalized anxiety disorder) 05/10/2016  . History of spinal fusion for scoliosis 03/26/2017  . Left upper quadrant pain 03/26/2017  . Malaise and fatigue 03/26/2017  . Paresthesias/numbness 04/24/2017  . Polyarthralgia 03/28/2017  . Positive Lyme disease serology   . Seasonal allergies   . Tension headache 03/26/2017     Social History   Socioeconomic History  . Marital status: Married    Spouse name: Not on file  . Number of children: 2  . Years of education: Not on file  . Highest education level: Not on file  Occupational History  . Occupation: HUMAN RESOURCES    Employer: [employer]  Social Needs  . Financial resource strain: Not on file  . Food insecurity:    Worry: Not on file    Inability: Not on file  . Transportation needs:    Medical: Not on file    Non-medical: Not on file  Tobacco Use  . Smoking status: Never Smoker  . Smokeless tobacco: Never Used  Substance and Sexual Activity  . Alcohol use:  Yes    Alcohol/week: 1.0 - 2.0 standard drinks    Types: 1 - 2 Standard drinks or equivalent per week  . Drug use: No  . Sexual activity: Yes    Partners: Male    Birth control/protection: OCP  Lifestyle  . Physical activity:    Days per week: Not on file    Minutes per session: Not on file  . Stress: Not on file  Relationships  . Social connections:    Talks on phone: Not on file    Gets together: Not on file    Attends religious service: Not on file    Active member of club or organization: Not on file    Attends meetings of clubs or organizations: Not on file    Relationship status: Not on file  . Intimate partner violence:    Fear of current or ex partner: Not on file    Emotionally abused: Not on file    Physically abused: Not on file    Forced sexual activity: Not on file  Other Topics Concern  . Not on file  Social History Narrative  . Not on file    Past Surgical History:  Procedure Laterality Date  . DILATATION & CURRETTAGE/HYSTEROSCOPY WITH RESECTOCOPE  02/12/2012   Procedure: [place];  Surgeon: Azalia Bilis, MD;  Location: [facility];  Service: Gynecology;  Laterality: N/A;  . DILATION AND  CURETTAGE OF UTERUS  2010  . DILATION AND CURETTAGE OF UTERUS  2012  . SPINAL FUSION  2008    Family History  Problem Relation Age of Onset  . Diabetes Maternal Grandmother   . Hypertension Maternal Grandmother   . Heart disease Maternal Grandmother   . Heart attack Maternal Grandmother   . Osteoporosis Maternal Grandmother   . Diabetes Maternal Grandfather   . Hypertension Maternal Grandfather   . Heart disease Maternal Grandfather   . Heart attack Maternal Grandfather   . Hypertension Mother   . Anxiety disorder Mother   . Diabetes Mother   . Heart murmur Brother   . Colon cancer Neg Hx   . Esophageal cancer Neg Hx   . Rectal cancer Neg Hx   . Stomach cancer Neg Hx   . Breast cancer Neg Hx     Allergies  Allergen  Reactions  . Zoloft [Sertraline Hcl] Other (See Comments)    Sedation    Current Medications:   Current Outpatient Medications:  .  cholecalciferol (VITAMIN D) 1000 units tablet, Take 1,000 Units by mouth daily., Disp: , Rfl:  .  CRANBERRY PO, Take by mouth every other day., Disp: , Rfl:  .  cyanocobalamin 100 MCG tablet, Take 100 mcg by mouth every other day. , Disp: , Rfl:  .  hydrocortisone 1 % ointment, Apply 1 application topically 2 (two) times daily. Use for 14 days., Disp: 30 g, Rfl: 1 .  Norethindrone Acetate-Ethinyl Estradiol (MICROGESTIN) 1.5-30 MG-MCG tablet, Take 1 tablet by mouth daily., Disp: 3 Package, Rfl: 4 .  omeprazole (PRILOSEC) 40 MG capsule, Take 1 capsule (40 mg total) by mouth daily., Disp: 30 capsule, Rfl: 3 .  Prenatal Vit-Fe Fumarate-FA (MULTIVITAMIN-PRENATAL) 27-0.8 MG TABS, Take 1 tablet by mouth daily., Disp: , Rfl:  .  valACYclovir (VALTREX) 1000 MG tablet, Take 1 tablet (1,000 mg total) by mouth 2 (two) times daily. (Patient taking differently: Take 1,000 mg by mouth 2 (two) times daily as needed. As needed), Disp: 20 tablet, Rfl: 0 .  amoxicillin-clavulanate (AUGMENTIN) 875-125 MG tablet, Take 1 tablet by mouth 2 (two) times daily., Disp: 20 tablet, Rfl: 0 .  predniSONE (DELTASONE) 5 MG tablet, 6-5-4-3-2-1-off, Disp: 21 tablet, Rfl: 0   Review of Systems:   Review of Systems  HENT: Positive for congestion (Nasal yellow draniage), ear pain, sinus pressure and sore throat.   Respiratory: Positive for cough.   Musculoskeletal: Positive for neck pain.  Neurological: Positive for headaches.    Vitals:   Vitals:   02/05/18 1124  BP: 140/68  Pulse: 95  Temp: 98.1 F (36.7 C)  TempSrc: Oral  SpO2: 97%  Weight: 150 lb 4 oz (68.2 kg)  Height: 5' 6"  (1.676 m)     Body mass index is 24.25 kg/m.  Physical Exam:   Physical Exam Vitals signs and nursing note reviewed.  Constitutional:      General: She is not in acute distress.    Appearance: She  is well-developed. She is not ill-appearing or toxic-appearing.  HENT:     Head: Normocephalic and atraumatic.     Right Ear: Ear canal and external ear normal.     Left Ear: Tympanic membrane, ear canal and external ear normal. Tympanic membrane is not erythematous, retracted or bulging.     Ears:     Comments: Unable to visualize R TM 2/2 cerumen     Nose:     Right Sinus: Maxillary sinus tenderness present. No frontal  sinus tenderness.     Left Sinus: Maxillary sinus tenderness present. No frontal sinus tenderness.     Mouth/Throat:     Pharynx: Uvula midline. No posterior oropharyngeal erythema.  Eyes:     General: Lids are normal.     Conjunctiva/sclera: Conjunctivae normal.  Neck:     Musculoskeletal: Normal range of motion.     Trachea: Trachea normal.  Cardiovascular:     Rate and Rhythm: Normal rate and regular rhythm.     Heart sounds: Normal heart sounds, S1 normal and S2 normal.  Pulmonary:     Effort: Pulmonary effort is normal.     Breath sounds: Normal breath sounds. No decreased breath sounds, wheezing, rhonchi or rales.  Skin:    General: Skin is warm and dry.  Neurological:     Mental Status: She is alert.  Psychiatric:        Speech: Speech normal.        Behavior: Behavior normal. Behavior is cooperative.     Assessment and Plan:   Annaya was seen today for sinus problem.  Diagnoses and all orders for this visit:  Acute non-recurrent frontal sinusitis  Other orders -     predniSONE (DELTASONE) 5 MG tablet; 6-5-4-3-2-1-off -     amoxicillin-clavulanate (AUGMENTIN) 875-125 MG tablet; Take 1 tablet by mouth 2 (two) times daily.   No red flags on exam.  Will initiate oral prednisone and augmentin per orders. Discussed taking medications as prescribed. Reviewed return precautions including worsening fever, SOB, worsening cough or other concerns. Push fluids and rest. I recommend that patient follow-up if symptoms worsen or persist despite treatment x 7-10  days, sooner if needed.  . Reviewed expectations re: course of current medical issues. . Discussed self-management of symptoms. . Outlined signs and symptoms indicating need for more acute intervention. . Patient verbalized understanding and all questions were answered. . See orders for this visit as documented in the electronic medical record. . Patient received an After-Visit Summary.  CMA or LPN served as scribe during this visit. History, Physical, and Plan performed by medical provider. The above documentation has been reviewed and is accurate and complete.   [name], PA-C

## 2018-02-18 ENCOUNTER — Ambulatory Visit: Payer: BLUE CROSS/BLUE SHIELD | Admitting: Family Medicine

## 2018-02-20 ENCOUNTER — Telehealth: Payer: Self-pay | Admitting: Family Medicine

## 2018-02-20 NOTE — Telephone Encounter (Signed)
Can pt be seen Mon or Tues? Please advise.   Copied from [id number]. Topic: Appointment Scheduling - Scheduling Inquiry for Clinic >> Feb 20, 2018  1:09 PM Scherrie Gerlach wrote: Reason for CRM: pt saw [facility] for a sinus infection and states she is still having major sinus pressure/issues. Pt prefers to see Dr Juleen China, declined another provider. Pt aware Dr Juleen China out Thurs and Friday this week. Pt states ok for appt Mon or Tues next week if Dr Juleen China will work her in.

## 2018-02-21 NOTE — Telephone Encounter (Signed)
Called patient put in for Monday. Reviewed red words that she will need to be seen asap for.

## 2018-02-24 NOTE — Progress Notes (Signed)
Alison Fleming is a 43 y.o. female here for an acute visit.  History of Present Illness:   Alison Fleming, CMA acting as scribe for Dr. [name].   HPI: Patient in office for re evaluation of sinus pressure and pain. She has finished prednisone and antibiotic. All symptoms are better but the head pain/pressure. Has continued if not gotten worse.   PMHx, SurgHx, SocialHx, Medications, and Allergies were reviewed in the Visit Navigator and updated as appropriate.  Current Medications   .  cholecalciferol (VITAMIN D) 1000 units tablet, Take 1,000 Units by mouth daily., Disp: , Rfl:  .  CRANBERRY PO, Take by mouth every other day., Disp: , Rfl:  .  cyanocobalamin 100 MCG tablet, Take 100 mcg by mouth every other day. , Disp: , Rfl:  .  hydrocortisone 1 % ointment, Apply 1 application topically 2 (two) times daily. Use for 14 days., Disp: 30 g, Rfl: 1 .  Norethindrone Acetate-Ethinyl Estradiol (MICROGESTIN) 1.5-30 MG-MCG tablet, Take 1 tablet by mouth daily., Disp: 3 Package, Rfl: 4 .  omeprazole (PRILOSEC) 40 MG capsule, Take 1 capsule (40 mg total) by mouth daily., Disp: 30 capsule, Rfl: 3 .  Prenatal Vit-Fe Fumarate-FA (MULTIVITAMIN-PRENATAL) 27-0.8 MG TABS, Take 1 tablet by mouth daily., Disp: , Rfl:  .  valACYclovir (VALTREX) 1000 MG tablet, Take 1 tablet (1,000 mg total) by mouth 2 (two) times daily as needed. As needed, Disp: 20 tablet, Rfl: 3  Allergies  Allergen Reactions  . Zoloft [Sertraline Hcl] Other (See Comments)    Sedation   Review of Systems   Pertinent items are noted in the HPI. Otherwise, ROS is negative.  Vitals   Vitals:   02/25/18 1111  BP: 136/78  Pulse: 89  Temp: 98.6 F (37 C)  TempSrc: Oral  SpO2: 97%  Weight: 151 lb 9.6 oz (68.8 kg)  Height: 5' 6"  (1.676 m)     Body mass index is 24.47 kg/m.  Physical Exam   Physical Exam Vitals signs and nursing note reviewed.  HENT:     Head: Normocephalic and atraumatic.     Nose:     Right  Sinus: Maxillary sinus tenderness and frontal sinus tenderness present.     Left Sinus: Maxillary sinus tenderness and frontal sinus tenderness present.  Eyes:     Pupils: Pupils are equal, round, and reactive to light.  Neck:     Musculoskeletal: Normal range of motion and neck supple.  Cardiovascular:     Rate and Rhythm: Normal rate and regular rhythm.     Heart sounds: Normal heart sounds.  Pulmonary:     Effort: Pulmonary effort is normal.  Abdominal:     Palpations: Abdomen is soft.  Skin:    General: Skin is warm.  Psychiatric:        Behavior: Behavior normal.    Assessment and Plan   Alison Fleming was seen today for sinusitis.  Diagnoses and all orders for this visit:  Bacterial sinusitis -     cefdinir (OMNICEF) 300 MG capsule; Take 1 capsule (300 mg total) by mouth 2 (two) times daily. -     fluticasone (FLONASE) 50 MCG/ACT nasal spray; Place 2 sprays into both nostrils daily.  Recurrent cold sores Comments: Patient asked for refill of Valtrex for as needed cold sores.  None today. Orders: -     valACYclovir (VALTREX) 1000 MG tablet; Take 1 tablet (1,000 mg total) by mouth 2 (two) times daily as needed. As needed   .  Reviewed expectations re: course of current medical issues. . Discussed self-management of symptoms. . Outlined signs and symptoms indicating need for more acute intervention. . Patient verbalized understanding and all questions were answered. [name] Health Maintenance issues including appropriate healthy diet, exercise, and smoking avoidance were discussed with patient. . See orders for this visit as documented in the electronic medical record. . Patient received an After Visit Summary.  CMA served as Education administrator during this visit. History, Physical, and Plan performed by medical provider. The above documentation has been reviewed and is accurate and complete. [name], D.O.  [name], DO [place] 02/25/2018

## 2018-02-25 ENCOUNTER — Encounter: Payer: Self-pay | Admitting: Family Medicine

## 2018-02-25 ENCOUNTER — Ambulatory Visit (INDEPENDENT_AMBULATORY_CARE_PROVIDER_SITE_OTHER): Payer: 59 | Admitting: Family Medicine

## 2018-02-25 VITALS — BP 136/78 | HR 89 | Temp 98.6°F | Ht 66.0 in | Wt 151.6 lb

## 2018-02-25 DIAGNOSIS — B001 Herpesviral vesicular dermatitis: Secondary | ICD-10-CM | POA: Diagnosis not present

## 2018-02-25 DIAGNOSIS — J329 Chronic sinusitis, unspecified: Secondary | ICD-10-CM | POA: Diagnosis not present

## 2018-02-25 DIAGNOSIS — B9689 Other specified bacterial agents as the cause of diseases classified elsewhere: Secondary | ICD-10-CM | POA: Diagnosis not present

## 2018-02-25 MED ORDER — CEFDINIR 300 MG PO CAPS: 300.0000 mg | ORAL_CAPSULE | Freq: Two times a day (BID) | ORAL | 0 refills | Status: DC

## 2018-02-25 MED ORDER — VALACYCLOVIR HCL 1 G PO TABS: 1000.0000 mg | ORAL_TABLET | Freq: Two times a day (BID) | ORAL | 3 refills | Status: DC | PRN

## 2018-02-25 MED ORDER — FLUTICASONE PROPIONATE 50 MCG/ACT NA SUSP: 2.0000 | Freq: Every day | NASAL | 6 refills | Status: DC

## 2018-02-27 ENCOUNTER — Other Ambulatory Visit: Payer: Self-pay

## 2018-02-27 ENCOUNTER — Encounter: Payer: Self-pay | Admitting: Certified Nurse Midwife

## 2018-02-27 ENCOUNTER — Ambulatory Visit (INDEPENDENT_AMBULATORY_CARE_PROVIDER_SITE_OTHER): Payer: 59 | Admitting: Certified Nurse Midwife

## 2018-02-27 VITALS — BP 118/76 | HR 64 | Resp 16 | Wt 149.0 lb

## 2018-02-27 DIAGNOSIS — N898 Other specified noninflammatory disorders of vagina: Secondary | ICD-10-CM

## 2018-02-27 DIAGNOSIS — B379 Candidiasis, unspecified: Secondary | ICD-10-CM

## 2018-02-27 DIAGNOSIS — T3695XA Adverse effect of unspecified systemic antibiotic, initial encounter: Secondary | ICD-10-CM

## 2018-02-27 MED ORDER — NYSTATIN 100000 UNIT/GM EX OINT: 1.0000 "application " | TOPICAL_OINTMENT | Freq: Two times a day (BID) | CUTANEOUS | 0 refills | Status: DC

## 2018-02-27 NOTE — Progress Notes (Signed)
43 y.o. Married Caucasian female [phone] here with complaint of vaginal symptoms of itching, burning, and increase discharge and redness in generalized vaginal area.. Describes discharge as scant. Itching is the main concern. Has noted change in pigment of skin and would like checked. She was given Amoxicillin for sinus infection for 2 weeks and prednisone. Felt this may have be related to antibiotic.[name] Onset of symptoms 7 days ago. Denies new personal products. Has used hydrocortisone to area with some relief. Also had HSV outbreak also and took Valtrex as directed and is feeling better. Currently not on antibiotic. No STD concerns. Urinary symptoms none, just when urine touches skin. . Contraception is OCP. No other concerns today.  Review of Systems  Constitutional: Negative.   HENT: Negative.   Eyes: Negative.   Respiratory: Negative.   Cardiovascular: Negative.   Gastrointestinal: Negative.  Negative for abdominal pain.  Genitourinary: Negative.  Negative for dysuria, frequency and urgency.  Musculoskeletal: Negative.   Skin: Positive for itching.       Vaginal area only  Neurological: Negative.   Endo/Heme/Allergies: Negative.   Psychiatric/Behavioral: Negative.     O:Healthy female WDWN Affect: normal, orientation x 3   Physical Exam Exam conducted with a chaperone present.  Constitutional:      Appearance: Normal appearance.  Cardiovascular:     Rate and Rhythm: Normal rate.  Pulmonary:     Effort: Pulmonary effort is normal.  Abdominal:     Palpations: Abdomen is soft.  Genitourinary:    Exam position: Lithotomy position.     Pubic Area: No rash.      Labia:        Right: Tenderness present. No lesion or injury.        Left: Tenderness present. No rash, lesion or injury.      Urethra: No urethral pain.     Vagina: Vaginal discharge and erythema present. No tenderness or lesions.     Cervix: No discharge or lesion.     Uterus: Normal.      Adnexa: Right adnexa  normal and left adnexa normal.       Right: No mass, tenderness or fullness.         Left: No mass, tenderness or fullness.         Comments: Slight redness around anal opening only, no rash Lymphadenopathy:     Lower Body: Right inguinal adenopathy present. Left inguinal adenopathy present.  Skin:    General: Skin is warm and dry.  Neurological:     Mental Status: She is alert and oriented to person, place, and time.  Psychiatric:        Mood and Affect: Mood and affect normal.        Behavior: Behavior normal. Behavior is cooperative.        Thought Content: Thought content normal.        Cognition and Memory: Cognition normal.        Judgment: Judgment normal.       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  Discussed Aveeno  sitz bath for comfort. Also discussed will treat with Rx suspected yeast until lab is in. Rx: Nystatin Ointment see order with instructions. Recommend obtaining 1 % Hydrocortisone cream and mix with Nystatin ointment for better relief of itching and discomfort twice daily for 5 days. Lab: Affirm  Continue Valtrex use until outbreak resolved up to 10 days. Questions addressed.  Rv prn  Rv prn

## 2018-02-27 NOTE — Patient Instructions (Signed)
Vaginal Yeast infection, Adult    Vaginal yeast infection is a condition that causes vaginal discharge as well as soreness, swelling, and redness (inflammation) of the vagina. This is a common condition. Some women get this infection frequently.  What are the causes?  This condition is caused by a change in the normal balance of the yeast (candida) and bacteria that live in the vagina. This change causes an overgrowth of yeast, which causes the inflammation.  What increases the risk?  The condition is more likely to develop in women who:   Take antibiotic medicines.   Have diabetes.   Take birth control pills.   Are pregnant.   Douche often.   Have a weak body defense system (immune system).   Have been taking steroid medicines for a long time.   Frequently wear tight clothing.  What are the signs or symptoms?  Symptoms of this condition include:   White, thick, creamy vaginal discharge.   Swelling, itching, redness, and irritation of the vagina. The lips of the vagina (vulva) may be affected as well.   Pain or a burning feeling while urinating.   Pain during sex.  How is this diagnosed?  This condition is diagnosed based on:   Your medical history.   A physical exam.   A pelvic exam. Your health care provider will examine a sample of your vaginal discharge under a microscope. Your health care provider may send this sample for testing to confirm the diagnosis.  How is this treated?  This condition is treated with medicine. Medicines may be over-the-counter or prescription. You may be told to use one or more of the following:   Medicine that is taken by mouth (orally).   Medicine that is applied as a cream (topically).   Medicine that is inserted directly into the vagina (suppository).  Follow these instructions at home:    Lifestyle   Do not have sex until your health care provider approves. Tell your sex partner that you have a yeast infection. That person should go to his or her health care  provider and ask if they should also be treated.   Do not wear tight clothes, such as pantyhose or tight pants.   Wear breathable cotton underwear.  General instructions   Take or apply over-the-counter and prescription medicines only as told by your health care provider.   Eat more yogurt. This may help to keep your yeast infection from returning.   Do not use tampons until your health care provider approves.   Try taking a sitz bath to help with discomfort. This is a warm water bath that is taken while you are sitting down. The water should only come up to your hips and should cover your buttocks. Do this 3-4 times per day or as told by your health care provider.   Do not douche.   If you have diabetes, keep your blood sugar levels under control.   Keep all follow-up visits as told by your health care provider. This is important.  Contact a health care provider if:   You have a fever.   Your symptoms go away and then return.   Your symptoms do not get better with treatment.   Your symptoms get worse.   You have new symptoms.   You develop blisters in or around your vagina.   You have blood coming from your vagina and it is not your menstrual period.   You develop pain in your abdomen.  Summary     Vaginal yeast infection is a condition that causes discharge as well as soreness, swelling, and redness (inflammation) of the vagina.   This condition is treated with medicine. Medicines may be over-the-counter or prescription.   Take or apply over-the-counter and prescription medicines only as told by your health care provider.   Do not douche. Do not have sex or use tampons until your health care provider approves.   Contact a health care provider if your symptoms do not get better with treatment or your symptoms go away and then return.  This information is not intended to replace advice given to you by your health care provider. Make sure you discuss any questions you have with your health care  provider.  Document Released: 10/12/2004 Document Revised: 05/21/2017 Document Reviewed: 05/21/2017  Elsevier Interactive Patient Education  2019 [employer]

## 2018-02-28 ENCOUNTER — Other Ambulatory Visit: Payer: Self-pay

## 2018-02-28 LAB — VAGINITIS/VAGINOSIS, DNA PROBE
Candida Species: POSITIVE — AB
Gardnerella vaginalis: POSITIVE — AB
Trichomonas vaginosis: NEGATIVE

## 2018-02-28 MED ORDER — TINIDAZOLE 500 MG PO TABS: 500.0000 mg | ORAL_TABLET | Freq: Two times a day (BID) | ORAL | 0 refills | Status: DC

## 2018-02-28 MED ORDER — FLUCONAZOLE 150 MG PO TABS: ORAL_TABLET | ORAL | 0 refills | Status: DC

## 2018-03-01 ENCOUNTER — Other Ambulatory Visit: Payer: Self-pay

## 2018-03-01 ENCOUNTER — Telehealth: Payer: Self-pay | Admitting: Family Medicine

## 2018-03-01 MED ORDER — OSELTAMIVIR PHOSPHATE 75 MG PO CAPS: 75.0000 mg | ORAL_CAPSULE | Freq: Every day | ORAL | 0 refills | Status: DC

## 2018-03-01 NOTE — Telephone Encounter (Signed)
Reviewed all possible side effects. She would like to have called in and will call if any issues.

## 2018-03-01 NOTE — Telephone Encounter (Signed)
Okay Tamiflu 75 mg daily x 5 days.

## 2018-03-01 NOTE — Telephone Encounter (Signed)
See note  Copied from [place] [id number]. Topic: General - Inquiry >> Feb 28, 2018  4:58 PM Sheran Luz wrote: Reason for CRM: Patient called inquiring if tamiflu can be sent into pharmacy without OV as patients daughter has tested positive for flu. Patient aware that Dr. Juleen China is out of office.

## 2018-03-12 ENCOUNTER — Ambulatory Visit: Payer: BLUE CROSS/BLUE SHIELD | Admitting: Certified Nurse Midwife

## 2018-03-19 ENCOUNTER — Ambulatory Visit (INDEPENDENT_AMBULATORY_CARE_PROVIDER_SITE_OTHER): Payer: 59 | Admitting: Certified Nurse Midwife

## 2018-03-19 ENCOUNTER — Encounter: Payer: Self-pay | Admitting: Certified Nurse Midwife

## 2018-03-19 ENCOUNTER — Other Ambulatory Visit: Payer: Self-pay

## 2018-03-19 VITALS — BP 120/74 | HR 68 | Resp 16 | Ht 66.0 in | Wt 148.0 lb

## 2018-03-19 DIAGNOSIS — Z01419 Encounter for gynecological examination (general) (routine) without abnormal findings: Secondary | ICD-10-CM

## 2018-03-19 DIAGNOSIS — Z3041 Encounter for surveillance of contraceptive pills: Secondary | ICD-10-CM

## 2018-03-19 DIAGNOSIS — Z124 Encounter for screening for malignant neoplasm of cervix: Secondary | ICD-10-CM

## 2018-03-19 MED ORDER — NORETHINDRONE ACET-ETHINYL EST 1.5-30 MG-MCG PO TABS: 1.0000 | ORAL_TABLET | Freq: Every day | ORAL | 4 refills | Status: DC

## 2018-03-19 NOTE — Progress Notes (Addendum)
43 y.o. [id number] Married  Caucasian Fe here for annual exam. Periods normal, no issues and no BTB over the past month! Started period today.OCP working well. Sees Dr. Juleen China PCP for aex, labs. Sinus infection is better. Yeast infection is now resolved with last Diflucan. HSV outbreak also resolved now. Feels like she is trying to finally be well. No other health issues today.  Patient's last menstrual period was 02/21/2018 (exact date).          Sexually active: Yes.    The current method of family planning is OCP (estrogen/progesterone).    Exercising: No.  exercise Smoker:  no  Review of Systems  Constitutional: Negative.   HENT:       Headaches  Eyes: Negative.   Respiratory: Negative.   Cardiovascular: Negative.   Gastrointestinal: Negative.   Genitourinary: Negative.   Musculoskeletal: Negative.   Skin: Negative.   Neurological: Negative.   Endo/Heme/Allergies: Negative.   Psychiatric/Behavioral: Negative.     Health Maintenance: Pap: 02-15-16 neg,  02-23-17 neg HPV HR neg patient request yearly pap smears aware of recommendations History of Abnormal Pap: no MMG:  12/19 bilateral & left breast u/s birads 2:neg Self Breast exams: yes Colonoscopy:  11/19 BMD:   none TDaP:  2017 Shingles: no Pneumonia: no Hep C and HIV: done during pregnancy Labs: no   reports that she has never smoked. She has never used smokeless tobacco. She reports current alcohol use of about 1.0 - 2.0 standard drinks of alcohol per week. She reports that she does not use drugs.  Past Medical History:  Diagnosis Date  . 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 Anxiety   . Atypical chest pain 04/24/2017  . Dyspepsia, s/p normal EGD 03/08/17, with recommendation for lifetime anti-reflux regimen 03/26/2017  . GAD (generalized anxiety disorder) 05/10/2016  . History of spinal fusion for scoliosis 03/26/2017  . Left upper quadrant pain 03/26/2017  . Malaise and fatigue 03/26/2017  . Paresthesias/numbness 04/24/2017  .  Polyarthralgia 03/28/2017  . Positive Lyme disease serology   . Seasonal allergies   . Tension headache 03/26/2017    Past Surgical History:  Procedure Laterality Date  . DILATATION & CURRETTAGE/HYSTEROSCOPY WITH RESECTOCOPE  02/12/2012   Procedure: Equality;  Surgeon: Azalia Bilis, MD;  Location: [facility];  Service: Gynecology;  Laterality: N/A;  . DILATION AND CURETTAGE OF UTERUS  2010  . DILATION AND CURETTAGE OF UTERUS  2012  . SPINAL FUSION  2008    Current Outpatient Medications  Medication Sig Dispense Refill  . cefdinir (OMNICEF) 300 MG capsule Take 1 capsule (300 mg total) by mouth 2 (two) times daily. (Patient not taking: Reported on 02/27/2018) 20 capsule 0  . cholecalciferol (VITAMIN D) 1000 units tablet Take 1,000 Units by mouth daily.    [name] CRANBERRY PO Take by mouth every other day.    . cyanocobalamin 100 MCG tablet Take 100 mcg by mouth every other day.     . fluconazole (DIFLUCAN) 150 MG tablet Take 1 po & repeat in 5 days 2 tablet 0  . fluticasone (FLONASE) 50 MCG/ACT nasal spray Place 2 sprays into both nostrils daily. 16 g 6  . hydrocortisone 1 % ointment Apply 1 application topically 2 (two) times daily. Use for 14 days. 30 g 1  . Norethindrone Acetate-Ethinyl Estradiol (MICROGESTIN) 1.5-30 MG-MCG tablet Take 1 tablet by mouth daily. 3 Package 4  . nystatin ointment (MYCOSTATIN) Apply 1 application topically 2 (two) times daily. Apply to affected  area for up to 7 days. 30 g 0  . omeprazole (PRILOSEC) 40 MG capsule Take 1 capsule (40 mg total) by mouth daily. 30 capsule 3  . oseltamivir (TAMIFLU) 75 MG capsule Take 1 capsule (75 mg total) by mouth daily. 5 capsule 0  . Prenatal Vit-Fe Fumarate-FA (MULTIVITAMIN-PRENATAL) 27-0.8 MG TABS Take 1 tablet by mouth daily.    [name] tinidazole (TINDAMAX) 500 MG tablet Take 1 tablet (500 mg total) by mouth 2 (two) times daily. 10 tablet 0  . valACYclovir (VALTREX) 1000 MG tablet Take 1  tablet (1,000 mg total) by mouth 2 (two) times daily as needed. As needed 20 tablet 3   No current facility-administered medications for this visit.     Family History  Problem Relation Age of Onset  . Diabetes Maternal Grandmother   . Hypertension Maternal Grandmother   . Heart disease Maternal Grandmother   . Heart attack Maternal Grandmother   . Osteoporosis Maternal Grandmother   . Diabetes Maternal Grandfather   . Hypertension Maternal Grandfather   . Heart disease Maternal Grandfather   . Heart attack Maternal Grandfather   . Hypertension Mother   . Anxiety disorder Mother   . Diabetes Mother   . Heart murmur Brother   . Colon cancer Neg Hx   . Esophageal cancer Neg Hx   . Rectal cancer Neg Hx   . Stomach cancer Neg Hx   . Breast cancer Neg Hx     ROS:  Pertinent items are noted in HPI.  Otherwise, a comprehensive ROS was negative.  Exam:   LMP 02/21/2018 (Exact Date)    Ht Readings from Last 3 Encounters:  02/25/18 5' 6"  (1.676 m)  02/05/18 5' 6"  (1.676 m)  12/25/17 5' 6"  (1.676 m)    General appearance: alert, cooperative and appears stated age Head: Normocephalic, without obvious abnormality, atraumatic Neck: no adenopathy, supple, symmetrical, trachea midline and thyroid normal to inspection and palpation Lungs: clear to auscultation bilaterally Breasts: normal appearance, no masses or tenderness, No nipple retraction or dimpling, No nipple discharge or bleeding, No axillary or supraclavicular adenopathy Heart: regular rate and rhythm Abdomen: soft, non-tender; no masses,  no organomegaly Extremities: extremities normal, atraumatic, no cyanosis or edema Skin: Skin color, texture, turgor normal. No rashes or lesions Lymph nodes: Cervical, supraclavicular, and axillary nodes normal. No abnormal inguinal nodes palpated Neurologic: Grossly normal   Pelvic: External genitalia:  no lesions              Urethra:  normal appearing urethra with no masses,  tenderness or lesions              Bartholin's and Skene's: normal                 Vagina: normal appearing vagina with normal color and discharge, no lesions              Cervix: multiparous appearance, no cervical motion tenderness and no lesions              Pap taken: Yes.   Bimanual Exam:  Uterus:  normal size, contour, position, consistency, mobility, non-tender and anteverted              Adnexa: normal adnexa and no mass, fullness, tenderness               Rectovaginal: Confirms               Anus:  normal sphincter tone, no lesions  Chaperone present:  yes  A:  Well Woman with normal exam  Contraception OCP desired    P:   Reviewed health and wellness pertinent to exam  Risks/benefits/warnings signs with OCP reviewed. Desires Rx.  Rx Microgestin See order with instructions  Pap smear: yes   counseled on breast self exam, mammography screening, feminine hygiene, adequate intake of calcium and vitamin D, diet and exercise  return annually or prn  An After Visit Summary was printed and given to the patient.

## 2018-03-19 NOTE — Patient Instructions (Signed)

## 2018-03-20 ENCOUNTER — Other Ambulatory Visit (HOSPITAL_COMMUNITY)
Admission: RE | Admit: 2018-03-20 | Discharge: 2018-03-20 | Disposition: A | Discharge status: Home / Self Care | Payer: 59 | Source: Ambulatory Visit | Attending: Certified Nurse Midwife | Admitting: Certified Nurse Midwife

## 2018-03-20 DIAGNOSIS — Z124 Encounter for screening for malignant neoplasm of cervix: Secondary | ICD-10-CM | POA: Diagnosis not present

## 2018-03-20 NOTE — Addendum Note (Signed)
Addended by: Regina Eck on: 03/20/2018 09:16 AM   Modules accepted: Orders

## 2018-03-21 LAB — CYTOLOGY - PAP
Diagnosis: NEGATIVE
HPV: NOT DETECTED

## 2018-03-28 ENCOUNTER — Other Ambulatory Visit: Payer: Self-pay | Admitting: Family Medicine

## 2018-03-28 NOTE — Telephone Encounter (Signed)
Copied from [place] [id number]. Topic: Quick Communication - Rx Refill/Question >> Mar 28, 2018  3:46 PM Blase Mess A wrote: Medication: omeprazole (PRILOSEC) 40 MG capsule [[id number]]   Has the patient contacted their pharmacy? Yes  (Agent: If no, request that the patient contact the pharmacy for the refill.) (Agent: If yes, when and what did the pharmacy advise?)  Preferred Pharmacy (with phone number or street name): [id number]- [place], [place] - [address] [phone](Phone) [phone](Fax)    Agent: Please be advised that RX refills may take up to 3 business days. We ask that you follow-up with your pharmacy.

## 2018-04-02 ENCOUNTER — Encounter: Payer: Self-pay | Admitting: Family Medicine

## 2018-04-02 ENCOUNTER — Other Ambulatory Visit: Payer: Self-pay

## 2018-04-02 ENCOUNTER — Ambulatory Visit (INDEPENDENT_AMBULATORY_CARE_PROVIDER_SITE_OTHER): Payer: 59 | Admitting: Family Medicine

## 2018-04-02 VITALS — BP 120/72 | HR 79 | Temp 98.5°F | Ht 66.0 in | Wt 149.8 lb

## 2018-04-02 DIAGNOSIS — B9689 Other specified bacterial agents as the cause of diseases classified elsewhere: Secondary | ICD-10-CM

## 2018-04-02 DIAGNOSIS — J329 Chronic sinusitis, unspecified: Secondary | ICD-10-CM | POA: Diagnosis not present

## 2018-04-02 MED ORDER — FLUCONAZOLE 150 MG PO TABS: 150.0000 mg | ORAL_TABLET | ORAL | 0 refills | Status: DC

## 2018-04-02 MED ORDER — AZELASTINE HCL 0.1 % NA SOLN: 1.0000 | Freq: Two times a day (BID) | NASAL | 12 refills | Status: DC

## 2018-04-02 MED ORDER — CEFDINIR 300 MG PO CAPS: 300.0000 mg | ORAL_CAPSULE | Freq: Two times a day (BID) | ORAL | 0 refills | Status: DC

## 2018-04-02 NOTE — Progress Notes (Signed)
Alison Fleming is a 43 y.o. female here for an acute visit.  History of Present Illness:   [name], CMA acting as scribe for Dr. [name].   Sinusitis  This is a recurrent problem. The current episode started in the past 7 days. The problem has been gradually worsening since onset. There has been no fever. Her pain is at a severity of 5/10. The pain is moderate. Associated symptoms include congestion, coughing, neck pain, sinus pressure and a sore throat. Pertinent negatives include no ear pain. Past treatments include oral decongestants, saline sprays and spray decongestants (two rounds of abx ). The treatment provided mild relief.   Patient complains of no smell and decreased ability to taste things.  PMHx, SurgHx, SocialHx, Medications, and Allergies were reviewed in the Visit Navigator and updated as appropriate.  Current Medications   .  cholecalciferol (VITAMIN D) 1000 units tablet, Take 1,000 Units by mouth daily., Disp: , Rfl:  .  CRANBERRY PO, Take by mouth every other day., Disp: , Rfl:  .  cyanocobalamin 100 MCG tablet, Take 100 mcg by mouth every other day. , Disp: , Rfl:  .  Norethindrone Acetate-Ethinyl Estradiol (MICROGESTIN) 1.5-30 MG-MCG tablet, Take 1 tablet by mouth daily., Disp: 3 Package, Rfl: 4 .  omeprazole (PRILOSEC) 40 MG capsule, TAKE 1 CAPSULE BY MOUTH EVERY DAY, Disp: 90 capsule, Rfl: 1 .  Prenatal Vit-Fe Fumarate-FA (MULTIVITAMIN-PRENATAL) 27-0.8 MG TABS, Take 1 tablet by mouth daily., Disp: , Rfl:  .  valACYclovir (VALTREX) 1000 MG tablet, Take 1 tablet (1,000 mg total) by mouth 2 (two) times daily as needed. As needed, Disp: 20 tablet, Rfl: 3   Allergies  Allergen Reactions  . Zoloft [Sertraline Hcl] Other (See Comments)    Sedation   Review of Systems   Pertinent items are noted in the HPI. Otherwise, ROS is negative.  Vitals   Vitals:   04/02/18 1302  BP: 120/72  Pulse: 79  Temp: 98.5 F (36.9 C)  TempSrc: Oral  SpO2: 99%    Weight: 149 lb 12.8 oz (67.9 kg)  Height: 5' 6"  (1.676 m)     Body mass index is 24.18 kg/m.  Physical Exam   Physical Exam Vitals signs and nursing note reviewed.  HENT:     Head: Normocephalic and atraumatic.     Nose:     Right Sinus: Maxillary sinus tenderness and frontal sinus tenderness present.     Left Sinus: Maxillary sinus tenderness and frontal sinus tenderness present.  Eyes:     Pupils: Pupils are equal, round, and reactive to light.  Neck:     Musculoskeletal: Normal range of motion and neck supple.  Cardiovascular:     Rate and Rhythm: Normal rate and regular rhythm.     Heart sounds: Normal heart sounds.  Pulmonary:     Effort: Pulmonary effort is normal.  Abdominal:     Palpations: Abdomen is soft.  Skin:    General: Skin is warm.  Psychiatric:        Behavior: Behavior normal.    Assessment and Plan   Daphane was seen today for sinusitis.  Diagnoses and all orders for this visit:  Bacterial sinusitis -     fluconazole (DIFLUCAN) 150 MG tablet; Take 1 tablet (150 mg total) by mouth once a week. -     cefdinir (OMNICEF) 300 MG capsule; Take 1 capsule (300 mg total) by mouth 2 (two) times daily. -     azelastine (ASTELIN) 0.1 %  nasal spray; Place 1 spray into both nostrils 2 (two) times daily. Use in each nostril as directed   . Reviewed expectations re: course of current medical issues. . Discussed self-management of symptoms. . Outlined signs and symptoms indicating need for more acute intervention. . Patient verbalized understanding and all questions were answered. [name] Health Maintenance issues including appropriate healthy diet, exercise, and smoking avoidance were discussed with patient. . See orders for this visit as documented in the electronic medical record. . Patient received an After Visit Summary.  CMA served as Education administrator during this visit. History, Physical, and Plan performed by medical provider. The above documentation has been reviewed  and is accurate and complete. [name], D.O.  [name], DO [place], [facility] 04/02/2018

## 2018-04-02 NOTE — Patient Instructions (Signed)
Sinusitis, Adult  Sinusitis is inflammation of your sinuses. Sinuses are hollow spaces in the bones around your face. Your sinuses are located:   Around your eyes.   In the middle of your forehead.   Behind your nose.   In your cheekbones.  Mucus normally drains out of your sinuses. When your nasal tissues become inflamed or swollen, mucus can become trapped or blocked. This allows bacteria, viruses, and fungi to grow, which leads to infection. Most infections of the sinuses are caused by a virus.  Sinusitis can develop quickly. It can last for up to 4 weeks (acute) or for more than 12 weeks (chronic). Sinusitis often develops after a cold.  What are the causes?  This condition is caused by anything that creates swelling in the sinuses or stops mucus from draining. This includes:   Allergies.   Asthma.   Infection from bacteria or viruses.   Deformities or blockages in your nose or sinuses.   Abnormal growths in the nose (nasal polyps).   Pollutants, such as chemicals or irritants in the air.   Infection from fungi (rare).  What increases the risk?  You are more likely to develop this condition if you:   Have a weak body defense system (immune system).   Do a lot of swimming or diving.   Overuse nasal sprays.   Smoke.  What are the signs or symptoms?  The main symptoms of this condition are pain and a feeling of pressure around the affected sinuses. Other symptoms include:   Stuffy nose or congestion.   Thick drainage from your nose.   Swelling and warmth over the affected sinuses.   Headache.   Upper toothache.   A cough that may get worse at night.   Extra mucus that collects in the throat or the back of the nose (postnasal drip).   Decreased sense of smell and taste.   Fatigue.   A fever.   Sore throat.   Bad breath.  How is this diagnosed?  This condition is diagnosed based on:   Your symptoms.   Your medical history.   A physical exam.   Tests to find out if your condition is  acute or chronic. This may include:  ? Checking your nose for nasal polyps.  ? Viewing your sinuses using a device that has a light (endoscope).  ? Testing for allergies or bacteria.  ? Imaging tests, such as an MRI or CT scan.  In rare cases, a bone biopsy may be done to rule out more serious types of fungal sinus disease.  How is this treated?  Treatment for sinusitis depends on the cause and whether your condition is chronic or acute.   If caused by a virus, your symptoms should go away on their own within 10 days. You may be given medicines to relieve symptoms. They include:  ? Medicines that shrink swollen nasal passages (topical intranasal decongestants).  ? Medicines that treat allergies (antihistamines).  ? A spray that eases inflammation of the nostrils (topical intranasal corticosteroids).  ? Rinses that help get rid of thick mucus in your nose (nasal saline washes).   If caused by bacteria, your health care provider may recommend waiting to see if your symptoms improve. Most bacterial infections will get better without antibiotic medicine. You may be given antibiotics if you have:  ? A severe infection.  ? A weak immune system.   If caused by narrow nasal passages or nasal polyps, you may need   to have surgery.  Follow these instructions at home:  Medicines   Take, use, or apply over-the-counter and prescription medicines only as told by your health care provider. These may include nasal sprays.   If you were prescribed an antibiotic medicine, take it as told by your health care provider. Do not stop taking the antibiotic even if you start to feel better.  Hydrate and humidify     Drink enough fluid to keep your urine pale yellow. Staying hydrated will help to thin your mucus.   Use a cool mist humidifier to keep the humidity level in your home above 50%.   Inhale steam for 10-15 minutes, 3-4 times a day, or as told by your health care provider. You can do this in the bathroom while a hot shower is  running.   Limit your exposure to cool or dry air.  Rest   Rest as much as possible.   Sleep with your head raised (elevated).   Make sure you get enough sleep each night.  General instructions     Apply a warm, moist washcloth to your face 3-4 times a day or as told by your health care provider. This will help with discomfort.   Wash your hands often with soap and water to reduce your exposure to germs. If soap and water are not available, use hand sanitizer.   Do not smoke. Avoid being around people who are smoking (secondhand smoke).   Keep all follow-up visits as told by your health care provider. This is important.  Contact a health care provider if:   You have a fever.   Your symptoms get worse.   Your symptoms do not improve within 10 days.  Get help right away if:   You have a severe headache.   You have persistent vomiting.   You have severe pain or swelling around your face or eyes.   You have vision problems.   You develop confusion.   Your neck is stiff.   You have trouble breathing.  Summary   Sinusitis is soreness and inflammation of your sinuses. Sinuses are hollow spaces in the bones around your face.   This condition is caused by nasal tissues that become inflamed or swollen. The swelling traps or blocks the flow of mucus. This allows bacteria, viruses, and fungi to grow, which leads to infection.   If you were prescribed an antibiotic medicine, take it as told by your health care provider. Do not stop taking the antibiotic even if you start to feel better.   Keep all follow-up visits as told by your health care provider. This is important.  This information is not intended to replace advice given to you by your health care provider. Make sure you discuss any questions you have with your health care provider.  Document Released: 01/02/2005 Document Revised: 06/04/2017 Document Reviewed: 06/04/2017  Elsevier Interactive Patient Education  2019 [employer]

## 2018-04-24 ENCOUNTER — Other Ambulatory Visit: Payer: Self-pay | Admitting: Family Medicine

## 2018-04-24 DIAGNOSIS — J329 Chronic sinusitis, unspecified: Principal | ICD-10-CM

## 2018-04-24 DIAGNOSIS — B9689 Other specified bacterial agents as the cause of diseases classified elsewhere: Secondary | ICD-10-CM

## 2018-04-24 NOTE — Telephone Encounter (Signed)
Last OV 04/02/2018 (bacterial sinusitis) Last refill 04/02/2018 #3/0 Next OV not scheduled  Forwarding to Dr. Juleen China

## 2018-08-20 ENCOUNTER — Other Ambulatory Visit: Payer: Self-pay | Admitting: Family Medicine

## 2018-08-20 DIAGNOSIS — B001 Herpesviral vesicular dermatitis: Secondary | ICD-10-CM

## 2018-09-13 ENCOUNTER — Other Ambulatory Visit: Payer: Self-pay

## 2018-09-13 ENCOUNTER — Encounter: Payer: Self-pay | Admitting: Family Medicine

## 2018-09-13 ENCOUNTER — Ambulatory Visit (INDEPENDENT_AMBULATORY_CARE_PROVIDER_SITE_OTHER): Payer: 59 | Admitting: Family Medicine

## 2018-09-13 ENCOUNTER — Telehealth: Payer: Self-pay

## 2018-09-13 VITALS — BP 112/72 | HR 82 | Temp 98.2°F | Ht 66.0 in | Wt 150.2 lb

## 2018-09-13 DIAGNOSIS — E538 Deficiency of other specified B group vitamins: Secondary | ICD-10-CM

## 2018-09-13 DIAGNOSIS — E559 Vitamin D deficiency, unspecified: Secondary | ICD-10-CM

## 2018-09-13 DIAGNOSIS — R079 Chest pain, unspecified: Secondary | ICD-10-CM

## 2018-09-13 DIAGNOSIS — R52 Pain, unspecified: Secondary | ICD-10-CM | POA: Diagnosis not present

## 2018-09-13 DIAGNOSIS — R5383 Other fatigue: Secondary | ICD-10-CM

## 2018-09-13 MED ORDER — CYCLOBENZAPRINE HCL 10 MG PO TABS: 10.0000 mg | ORAL_TABLET | Freq: Three times a day (TID) | ORAL | 0 refills | Status: DC | PRN

## 2018-09-13 MED ORDER — MELOXICAM 15 MG PO TABS: 15.0000 mg | ORAL_TABLET | Freq: Every day | ORAL | 0 refills | Status: DC

## 2018-09-13 NOTE — Telephone Encounter (Signed)
Patient states has been having symptoms for around two weeks. Pain is sharp and can get to 8/10. It will only last for a few seconds then move on to another part of body. She has had pain in her back, sides, abdomen and some in chest. She denies any fever, urinary symptoms, cough, congestion, nausea or vomiting. She has not had any shortness of breath. She has had some ongoing symptoms of constipation. I have made an app for evaluation today.

## 2018-09-13 NOTE — Patient Instructions (Addendum)
It was very nice to see you today!  You are having inflammation in your muscles.  Please take the Flexeril and diclofenac for the next week.  Please work on stretches and being active.  Let me know if your symptoms worsen or do not improve the next week or so.  Take care, Dr Jerline Pain

## 2018-09-13 NOTE — Telephone Encounter (Signed)
Pt needs triaged. Please advise   Copied from [place] [phone]. Topic: Appointment Scheduling - Scheduling Inquiry for Clinic >> Sep 13, 2018  7:45 AM Scherrie Gerlach wrote: Reason for CRM: pt requesting appt today or Monday.  Pt states for about 2 weeks she has had shooting, sharp pains in her side, back, stomach,chest, upper body and randomly, and seems to be worse this past week.  No other symptoms She also wants to get labs done for her insurance.

## 2018-09-13 NOTE — Progress Notes (Signed)
   Chief Complaint:  Alison Fleming is a 43 y.o. female who presents for same day appointment with a chief complaint of body pains.   Assessment/Plan:  Muscular Fleming Multifactorial in setting of underlying scoliosis and poor sleep habits.  Think that she is having mostly myofascial Fleming.  Does not have any red flags today.  EKG is normal. Will start Mobic 15 mg daily and Flexeril 10 mg at night for the next 1 to 2 weeks.  Encouraged her to stay active and continue with routine stretches and exercises.  Discussed reasons to return to care.  Follow-up as needed.     Subjective:  HPI:  Body Pains, acute problem Started about 2 weeks ago. She has random, sharp pains in multiple body parts including side, back, stomach, chest, and arms. Fleming occurs randomly and seems to be worsening. No obvious precipitating events.  No reported fevers or chills.  No reported recent illnesses.  She is having some headaches that are normal for her.  Symptoms last for a few minutes and then subside spontaneously.  She has several episodes per day.  No weakness or numbness.  No treatments tried.  No other obvious alleviating or aggravating factors.   She has been getting about 6-7 active sleep per night for the past few weeks.  She also often have interrupted sleep due to her headaches.  ROS: Per HPI  PMH: She reports that she has never smoked. She has never used smokeless tobacco. She reports current alcohol use of about 1.0 - 2.0 standard drinks of alcohol per week. She reports that she does not use drugs.      Objective:  Physical Exam: BP 112/72   Pulse 82   Temp 98.2 F (36.8 C)   Ht 5' 6"  (1.676 m)   Wt 150 lb 4 oz (68.2 kg)   LMP 09/04/2018 (Approximate)   SpO2 98%   BMI 24.25 kg/m   Gen: NAD, resting comfortably CV: Regular rate and rhythm with no murmurs appreciated Pulm: Normal work of breathing, clear to auscultation bilaterally with no crackles, wheezes, or rhonchi MSK: Slight tenderness to  palpation appreciated across upper chest, upper back, and upper extremities. Skin: Warm, dry Neuro: Grossly normal, moves all extremities Psych: Normal affect and thought content  EKG: NSR. No ischemic changes.       Alison Fleming. Alison Pain, MD 09/13/2018 3:11 PM

## 2018-09-16 ENCOUNTER — Other Ambulatory Visit: Payer: Self-pay | Admitting: Family Medicine

## 2018-09-17 NOTE — Telephone Encounter (Signed)
Last fill 03/28/18  #90/1 Last OV 08/2018 [place] Next OV 09/27/18

## 2018-09-27 ENCOUNTER — Other Ambulatory Visit (INDEPENDENT_AMBULATORY_CARE_PROVIDER_SITE_OTHER): Payer: 59

## 2018-09-27 ENCOUNTER — Other Ambulatory Visit: Payer: Self-pay

## 2018-09-27 DIAGNOSIS — E538 Deficiency of other specified B group vitamins: Secondary | ICD-10-CM | POA: Diagnosis not present

## 2018-09-27 DIAGNOSIS — R5383 Other fatigue: Secondary | ICD-10-CM

## 2018-09-27 DIAGNOSIS — E559 Vitamin D deficiency, unspecified: Secondary | ICD-10-CM | POA: Diagnosis not present

## 2018-09-27 LAB — COMPREHENSIVE METABOLIC PANEL
ALT: 17 U/L (ref 0–35)
AST: 17 U/L (ref 0–37)
Albumin: 4.1 g/dL (ref 3.5–5.2)
Alkaline Phosphatase: 32 U/L — ABNORMAL LOW (ref 39–117)
BUN: 15 mg/dL (ref 6–23)
CO2: 24 mEq/L (ref 19–32)
Calcium: 9.1 mg/dL (ref 8.4–10.5)
Chloride: 104 mEq/L (ref 96–112)
Creatinine, Ser: 0.81 mg/dL (ref 0.40–1.20)
GFR: 77.63 mL/min (ref >60.00)
Glucose, Bld: 82 mg/dL (ref 70–99)
Potassium: 3.8 mEq/L (ref 3.5–5.1)
Sodium: 138 mEq/L (ref 135–145)
Total Bilirubin: 0.6 mg/dL (ref 0.2–1.2)
Total Protein: 7.1 g/dL (ref 6.0–8.3)

## 2018-09-27 LAB — LIPID PANEL
Cholesterol: 163 mg/dL (ref 0–200)
HDL: 52.4 mg/dL (ref >39.00)
LDL Cholesterol: 89 mg/dL (ref 0–99)
NonHDL: 111
Total CHOL/HDL Ratio: 3
Triglycerides: 109 mg/dL (ref 0.0–149.0)
VLDL: 21.8 mg/dL (ref 0.0–40.0)

## 2018-09-27 LAB — CBC WITH DIFFERENTIAL/PLATELET
Basophils Absolute: 0.1 10*3/uL (ref 0.0–0.1)
Basophils Relative: 0.8 % (ref 0.0–3.0)
Eosinophils Absolute: 0.2 10*3/uL (ref 0.0–0.7)
Eosinophils Relative: 2.4 % (ref 0.0–5.0)
HCT: 37.1 % (ref 36.0–46.0)
Hemoglobin: 12.8 g/dL (ref 12.0–15.0)
Lymphocytes Relative: 21.4 % (ref 12.0–46.0)
Lymphs Abs: 1.5 10*3/uL (ref 0.7–4.0)
MCHC: 34.5 g/dL (ref 30.0–36.0)
MCV: 90.3 fl (ref 78.0–100.0)
Monocytes Absolute: 0.5 10*3/uL (ref 0.1–1.0)
Monocytes Relative: 7.2 % (ref 3.0–12.0)
Neutro Abs: 4.8 10*3/uL (ref 1.4–7.7)
Neutrophils Relative %: 68.2 % (ref 43.0–77.0)
Platelets: 262 10*3/uL (ref 150.0–400.0)
RBC: 4.1 Mil/uL (ref 3.87–5.11)
RDW: 12.8 % (ref 11.5–15.5)
WBC: 7 10*3/uL (ref 4.0–10.5)

## 2018-09-27 LAB — VITAMIN D 25 HYDROXY (VIT D DEFICIENCY, FRACTURES): VITD: 48.97 ng/mL (ref 30.00–100.00)

## 2018-09-27 LAB — VITAMIN B12: Vitamin B-12: 1395 pg/mL — ABNORMAL HIGH (ref 211–911)

## 2018-09-27 LAB — TSH: TSH: 1.06 u[IU]/mL (ref 0.35–4.50)

## 2018-09-27 LAB — HEMOGLOBIN A1C: Hgb A1c MFr Bld: 5.3 % (ref 4.6–6.5)

## 2018-10-01 NOTE — Progress Notes (Signed)
Alison Fleming is a 43 y.o. female is here for follow up.  History of Present Illness:   HPI: Patient is doing well. Had upper body spasms 1-2 weeks ago. Rx Flexeril at night helped immensely. No new issues. Wants to know if she can stop PPI. Needs forms completed for insurance today. Labs completed last week for review together today.  There are no preventive care reminders to display for this patient. Depression screen [facility] 2/9 10/02/2018 05/18/2017 10/12/2016  Decreased Interest 0 1 0  Down, Depressed, Hopeless 0 0 0  PHQ - 2 Score 0 1 0  Altered sleeping 0 2 -  Tired, decreased energy 0 2 -  Change in appetite 2 3 -  Feeling bad or failure about yourself  0 0 -  Trouble concentrating 1 3 -  Moving slowly or fidgety/restless 0 0 -  Suicidal thoughts 0 0 -  PHQ-9 Score 3 11 -  Difficult doing work/chores Not difficult at all Somewhat difficult -   PMHx, SurgHx, SocialHx, FamHx, Medications, and Allergies were reviewed in the Visit Navigator and updated as appropriate.   Patient Active Problem List   Diagnosis Date Noted  . Dysfunctional uterine bleeding, with normal [place] 11/16/2017  . Change in bowel habits 11/16/2017  . Palpitation 04/30/2017  . Paresthesias/numbness 04/24/2017  . Polyarthralgia 03/28/2017  . Dyspepsia, s/p normal EGD 03/08/17, with recommendation for lifetime anti-reflux regimen 03/26/2017  . History of spinal fusion for scoliosis 03/26/2017  . Malaise and fatigue 03/26/2017  . Tension headache 03/26/2017  . 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 GAD (generalized anxiety disorder) 05/10/2016   Social History   Tobacco Use  . Smoking status: Never Smoker  . Smokeless tobacco: Never Used  Substance Use Topics  . Alcohol use: Yes    Alcohol/week: 1.0 - 2.0 standard drinks    Types: 1 - 2 Standard drinks or equivalent per week  . Drug use: No   Current Medications and Allergies   Current Outpatient Medications:  .  cholecalciferol (VITAMIN D) 1000 units  tablet, Take 1,000 Units by mouth daily., Disp: , Rfl:  .  CRANBERRY PO, Take by mouth every other day., Disp: , Rfl:  .  cyanocobalamin 100 MCG tablet, Take 100 mcg by mouth every other day. , Disp: , Rfl:  .  meloxicam (MOBIC) 15 MG tablet, Take 1 tablet (15 mg total) by mouth daily., Disp: 30 tablet, Rfl: 0 .  Norethindrone Acetate-Ethinyl Estradiol (MICROGESTIN) 1.5-30 MG-MCG tablet, Take 1 tablet by mouth daily., Disp: 3 Package, Rfl: 4 .  omeprazole (PRILOSEC) 40 MG capsule, TAKE 1 CAPSULE BY MOUTH EVERY DAY, Disp: 30 capsule, Rfl: 5 .  Prenatal Vit-Fe Fumarate-FA (MULTIVITAMIN-PRENATAL) 27-0.8 MG TABS, Take 1 tablet by mouth daily., Disp: , Rfl:  .  valACYclovir (VALTREX) 1000 MG tablet, TAKE 1 TABLET (1,000 MG TOTAL) BY MOUTH 2 (TWO) TIMES DAILY AS NEEDED. AS NEEDED, Disp: 20 tablet, Rfl: 3 .  cyclobenzaprine (FLEXERIL) 10 MG tablet, Take 1 tablet (10 mg total) by mouth at bedtime., Disp: 90 tablet, Rfl: 2   Allergies  Allergen Reactions  . Zoloft [Sertraline Hcl] Other (See Comments)    Sedation   Review of Systems   Pertinent items are noted in the HPI. Otherwise, a complete ROS is negative.  Vitals   Vitals:   10/02/18 0929  BP: 102/70  Pulse: 82  Temp: 98.1 F (36.7 C)  TempSrc: Temporal  SpO2: 97%  Weight: 151 lb 12.8 oz (68.9 kg)  Height: 5'  6" (1.676 m)     Body mass index is 24.5 kg/m.  Physical Exam   Physical Exam  Results for orders placed or performed in visit on 09/27/18  VITAMIN D 25 Hydroxy (Vit-D Deficiency, Fractures)  Result Value Ref Range   VITD 48.97 30.00 - 100.00 ng/mL  Vitamin B12  Result Value Ref Range   Vitamin B-12 1,395 (H) 211 - 911 pg/mL  TSH  Result Value Ref Range   TSH 1.06 0.35 - 4.50 uIU/mL  Lipid panel  Result Value Ref Range   Cholesterol 163 0 - 200 mg/dL   Triglycerides 109.0 0.0 - 149.0 mg/dL   HDL 52.40 >39.00 mg/dL   VLDL 21.8 0.0 - 40.0 mg/dL   LDL Cholesterol 89 0 - 99 mg/dL   Total CHOL/HDL Ratio 3     NonHDL 111.00   Hemoglobin A1c  Result Value Ref Range   Hgb A1c MFr Bld 5.3 4.6 - 6.5 %  Comprehensive metabolic panel  Result Value Ref Range   Sodium 138 135 - 145 mEq/L   Potassium 3.8 3.5 - 5.1 mEq/L   Chloride 104 96 - 112 mEq/L   CO2 24 19 - 32 mEq/L   Glucose, Bld 82 70 - 99 mg/dL   BUN 15 6 - 23 mg/dL   Creatinine, Ser 0.81 0.40 - 1.20 mg/dL   Total Bilirubin 0.6 0.2 - 1.2 mg/dL   Alkaline Phosphatase 32 (L) 39 - 117 U/L   AST 17 0 - 37 U/L   ALT 17 0 - 35 U/L   Total Protein 7.1 6.0 - 8.3 g/dL   Albumin 4.1 3.5 - 5.2 g/dL   Calcium 9.1 8.4 - 10.5 mg/dL   GFR 77.63 >60.00 mL/min  CBC with Differential/Platelet  Result Value Ref Range   WBC 7.0 4.0 - 10.5 K/uL   RBC 4.10 3.87 - 5.11 Mil/uL   Hemoglobin 12.8 12.0 - 15.0 g/dL   HCT 37.1 36.0 - 46.0 %   MCV 90.3 78.0 - 100.0 fl   MCHC 34.5 30.0 - 36.0 g/dL   RDW 12.8 11.5 - 15.5 %   Platelets 262.0 150.0 - 400.0 K/uL   Neutrophils Relative % 68.2 43.0 - 77.0 %   Lymphocytes Relative 21.4 12.0 - 46.0 %   Monocytes Relative 7.2 3.0 - 12.0 %   Eosinophils Relative 2.4 0.0 - 5.0 %   Basophils Relative 0.8 0.0 - 3.0 %   Neutro Abs 4.8 1.4 - 7.7 K/uL   Lymphs Abs 1.5 0.7 - 4.0 K/uL   Monocytes Absolute 0.5 0.1 - 1.0 K/uL   Eosinophils Absolute 0.2 0.0 - 0.7 K/uL   Basophils Absolute 0.1 0.0 - 0.1 K/uL   Assessment and Plan   Alison Fleming was seen today for lab follow-up.  Diagnoses and all orders for this visit:  Tension headache -     cyclobenzaprine (FLEXERIL) 10 MG tablet; Take 1 tablet (10 mg total) by mouth at bedtime.  History of spinal fusion for scoliosis  Dyspepsia, s/p normal EGD 03/08/17, with recommendation for lifetime anti-reflux regimen  Doing well. Continue current treatment.   . Orders and follow up as documented in [place], reviewed diet, exercise and weight control, cardiovascular risk and specific lipid/LDL goals reviewed, reviewed medications and side effects in detail.  . Reviewed expectations  re: course of current medical issues. . Outlined signs and symptoms indicating need for more acute intervention. . Patient verbalized understanding and all questions were answered. . Patient received an After Visit Summary.  [name], DO [place], [facility] 10/02/2018

## 2018-10-02 ENCOUNTER — Ambulatory Visit (INDEPENDENT_AMBULATORY_CARE_PROVIDER_SITE_OTHER): Payer: 59 | Admitting: Family Medicine

## 2018-10-02 ENCOUNTER — Encounter: Payer: Self-pay | Admitting: Family Medicine

## 2018-10-02 ENCOUNTER — Other Ambulatory Visit: Payer: Self-pay

## 2018-10-02 VITALS — BP 102/70 | HR 82 | Temp 98.1°F | Ht 66.0 in | Wt 151.8 lb

## 2018-10-02 DIAGNOSIS — Z8739 Personal history of other diseases of the musculoskeletal system and connective tissue: Secondary | ICD-10-CM

## 2018-10-02 DIAGNOSIS — G44209 Tension-type headache, unspecified, not intractable: Secondary | ICD-10-CM

## 2018-10-02 DIAGNOSIS — R1013 Epigastric pain: Secondary | ICD-10-CM

## 2018-10-02 DIAGNOSIS — Z981 Arthrodesis status: Secondary | ICD-10-CM | POA: Diagnosis not present

## 2018-10-02 MED ORDER — CYCLOBENZAPRINE HCL 10 MG PO TABS: 10.0000 mg | ORAL_TABLET | Freq: Every day | ORAL | 2 refills | Status: DC

## 2018-10-06 ENCOUNTER — Other Ambulatory Visit: Payer: Self-pay | Admitting: Family Medicine

## 2018-10-07 NOTE — Telephone Encounter (Signed)
Patient requesting refill. 

## 2018-10-12 ENCOUNTER — Other Ambulatory Visit: Payer: Self-pay | Admitting: Certified Nurse Midwife

## 2018-10-12 DIAGNOSIS — Z3041 Encounter for surveillance of contraceptive pills: Secondary | ICD-10-CM

## 2018-11-03 ENCOUNTER — Other Ambulatory Visit: Payer: Self-pay | Admitting: Family Medicine

## 2018-12-02 ENCOUNTER — Other Ambulatory Visit: Payer: Self-pay | Admitting: Certified Nurse Midwife

## 2018-12-02 DIAGNOSIS — Z1231 Encounter for screening mammogram for malignant neoplasm of breast: Secondary | ICD-10-CM

## 2019-01-24 ENCOUNTER — Ambulatory Visit
Admission: RE | Admit: 2019-01-24 | Discharge: 2019-01-24 | Disposition: A | Discharge status: Home / Self Care | Payer: 59 | Source: Ambulatory Visit | Attending: Certified Nurse Midwife | Admitting: Certified Nurse Midwife

## 2019-01-24 ENCOUNTER — Ambulatory Visit: Payer: 59

## 2019-01-24 ENCOUNTER — Other Ambulatory Visit: Payer: Self-pay

## 2019-01-24 DIAGNOSIS — Z1231 Encounter for screening mammogram for malignant neoplasm of breast: Secondary | ICD-10-CM

## 2019-01-28 ENCOUNTER — Other Ambulatory Visit: Payer: Self-pay | Admitting: Certified Nurse Midwife

## 2019-01-28 DIAGNOSIS — R928 Other abnormal and inconclusive findings on diagnostic imaging of breast: Secondary | ICD-10-CM

## 2019-02-05 ENCOUNTER — Ambulatory Visit
Admission: RE | Admit: 2019-02-05 | Discharge: 2019-02-05 | Disposition: A | Discharge status: Home / Self Care | Payer: 59 | Source: Ambulatory Visit | Attending: Certified Nurse Midwife | Admitting: Certified Nurse Midwife

## 2019-02-05 ENCOUNTER — Ambulatory Visit: Payer: 59

## 2019-02-05 ENCOUNTER — Other Ambulatory Visit: Payer: Self-pay

## 2019-02-05 DIAGNOSIS — R928 Other abnormal and inconclusive findings on diagnostic imaging of breast: Secondary | ICD-10-CM

## 2019-02-07 ENCOUNTER — Other Ambulatory Visit: Payer: Self-pay

## 2019-02-10 ENCOUNTER — Ambulatory Visit (INDEPENDENT_AMBULATORY_CARE_PROVIDER_SITE_OTHER): Payer: 59 | Admitting: Certified Nurse Midwife

## 2019-02-10 ENCOUNTER — Other Ambulatory Visit: Payer: Self-pay

## 2019-02-10 ENCOUNTER — Encounter: Payer: Self-pay | Admitting: Certified Nurse Midwife

## 2019-02-10 ENCOUNTER — Telehealth: Payer: Self-pay | Admitting: *Deleted

## 2019-02-10 VITALS — BP 110/70 | HR 70 | Temp 97.9°F | Resp 16 | Wt 157.0 lb

## 2019-02-10 DIAGNOSIS — N926 Irregular menstruation, unspecified: Secondary | ICD-10-CM

## 2019-02-10 DIAGNOSIS — L292 Pruritus vulvae: Secondary | ICD-10-CM | POA: Diagnosis not present

## 2019-02-10 LAB — POCT URINE PREGNANCY: Preg Test, Ur: NEGATIVE

## 2019-02-10 NOTE — Telephone Encounter (Signed)
Patient is returning a call to Jill. °

## 2019-02-10 NOTE — Telephone Encounter (Signed)
Spoke with patient. Patient request 8am PUS appt. PUS scheduled for 2/4 at 8am, consult to follow at 8:30am with Dr. Quincy Simmonds. Order placed for precert. Patient verbalizes understanding and is agreeable.   Routing to provider for final review. Patient is agreeable to disposition. Will close encounter.  Cc: Dr. Quincy Simmonds, [place]dalene Patricia, [address]

## 2019-02-10 NOTE — Telephone Encounter (Signed)
-----   Message from Regina Eck, CNM sent at 02/10/2019 12:59 PM EST ----- Patient needs PUS for continued BTB with longer duration with oral contraception dosage change, to make sure no issuesShe is aware she will be called with information.

## 2019-02-10 NOTE — Patient Instructions (Signed)
Oral Contraception Use Oral contraceptive pills (OCPs) are medicines that you take to prevent pregnancy. OCPs work by:  Preventing the ovaries from releasing eggs.  Thickening mucus in the lower part of the uterus (cervix), which prevents sperm from entering the uterus.  Thinning the lining of the uterus (endometrium), which prevents a fertilized egg from attaching to the endometrium. OCPs are highly effective when taken exactly as prescribed. However, OCPs do not prevent sexually transmitted infections (STIs). Safe sex practices, such as using condoms while on an OCP, can help prevent STIs. Before taking OCPs, you may have a physical exam, blood test, and Pap test. A Pap test involves taking a sample of cells from your cervix to check for cancer. Discuss with your health care provider the possible side effects of the OCP you may be prescribed. When you start an OCP, be aware that it can take 2-3 months for your body to adjust to changes in hormone levels. How to take oral contraceptive pills Follow instructions from your health care provider about how to start taking your first cycle of OCPs. Your health care provider may recommend that you:  Start the pill on day 1 of your menstrual period. If you start at this time, you will not need any backup form of birth control (contraception), such as condoms.  Start the pill on the first Sunday after your menstrual period or on the day you get your prescription. In these cases, you will need to use backup contraception for the first week.  Start the pill at any time of your cycle. ? If you take the pill within 5 days of the start of your period, you will not need a backup form of contraception. ? If you start at any other time of your menstrual cycle, you will need to use another form of contraception for 7 days. If your OCP is the type called a minipill, it will protect you from pregnancy after taking it for 2 days (48 hours), and you can stop using  backup contraception after that time. After you have started taking OCPs:  If you forget to take 1 pill, take it as soon as you remember. Take the next pill at the regular time.  If you miss 2 or more pills, call your health care provider. Different pills have different instructions for missed doses. Use backup birth control until your next menstrual period starts.  If you use a 28-day pack that contains inactive pills and you miss 1 of the last 7 pills (pills with no hormones), throw away the rest of the non-hormone pills and start a new pill pack. No matter which day you start the OCP, you will always start a new pack on that same day of the week. Have an extra pack of OCPs and a backup contraceptive method available in case you miss some pills or lose your OCP pack. Follow these instructions at home:  Do not use any products that contain nicotine or tobacco, such as cigarettes and e-cigarettes. If you need help quitting, ask your health care provider.  Always use a condom to protect against STIs. OCPs do not protect against STIs.  Use a calendar to mark the days of your menstrual period.  Read the information and directions that came with your OCP. Talk to your health care provider if you have questions. Contact a health care provider if:  You develop nausea and vomiting.  You have abnormal vaginal discharge or bleeding.  You develop a rash.  You miss your menstrual period. Depending on the type of OCP you are taking, this may be a sign of pregnancy. Ask your health care provider for more information.  You are losing your hair.  You need treatment for mood swings or depression.  You get dizzy when taking the OCP.  You develop acne after taking the OCP.  You become pregnant or think you may be pregnant.  You have diarrhea, constipation, and abdominal pain or cramps.  You miss 2 or more pills. Get help right away if:  You develop chest pain.  You develop shortness of  breath.  You have an uncontrolled or severe headache.  You develop numbness or slurred speech.  You develop visual or speech problems.  You develop pain, redness, and swelling in your legs.  You develop weakness or numbness in your arms or legs. Summary  Oral contraceptive pills (OCPs) are medicines that you take to prevent pregnancy.  OCPs do not prevent sexually transmitted infections (STIs). Always use a condom to protect against STIs.  When you start an OCP, be aware that it can take 2-3 months for your body to adjust to changes in hormone levels.  Read all the information and directions that come with your OCP. This information is not intended to replace advice given to you by your health care provider. Make sure you discuss any questions you have with your health care provider. Document Revised: 04/26/2018 Document Reviewed: 02/14/2016 Elsevier Patient Education  [place].

## 2019-02-10 NOTE — Telephone Encounter (Signed)
Left message to call Uziah Sorter, RN at [facility] [phone].   

## 2019-02-10 NOTE — Progress Notes (Signed)
44 y.o. Married Caucasian female [id number] with LMP  01/22/2019 here complaining of  break through bleeding with contraceptive use of Microgestin 1.5/30.  Using contraception method as prescribed.  Denies inconsistent use.  No new medications. Describes break through bleeding occurring on days 14-16 days of cycle, duration usually 2 days, and occurred more frequently.         . Patient is experiencing no cramping with menses. Denies vaginal itching or increase discharge. No HSV outbreaks. She has noted slight cracking of skin at pubic area, that occurs with stress. No other health issues today. upt-neg  O: Healthy WN WD female   Alert oriented x3 Skin:warm and dry Thyroid:normal to inspection and palpation Inguinal lymph nodes: not tender or enlarged Abdomen: soft, non tender, no rebound or masses Pelvic exam: External genital: small slit in skin on top of pubic area, slight redness, slight tenderness Vagina: normal appearance with beige discharge, no odor or tenderness Affirm taken Cervix: normal appearance, multiparous,, no CMT, no bleeding noted Uterus:non tender, no masses, normal size,  Adnexa: no masses or fullness, normal to palpation Rectal area: no lesions normal appearance   A:Normal Pelvic exam BTB with OCP contraceptive again after dosage change, increase in amount and duration. Recent TSH with PCP all normal( see lab in chart) Suspect HSV outbreak at pubic area History of HSV    P:Reviewed findings of normal pelvic exam with patient.  Discussed with previous RX change this had improved and now has increased again with longer duration. Feel with normal exam PUS warranted. Discussed possible fibroid, polyp or normal finding. Patient agreeable. Patient will be called with information and scheduled. Discussed slit in skin can also be HSV as the cause. Would start on Valtrex and will screen do vaginal screen. Affirm collected. Questions addressed at length. Warning signs of heavy  bleeding given and need to advise.  Lab: affirm  Rv prn as above

## 2019-02-11 LAB — VAGINITIS/VAGINOSIS, DNA PROBE
Candida Species: NEGATIVE
Gardnerella vaginalis: NEGATIVE
Trichomonas vaginosis: NEGATIVE

## 2019-02-18 ENCOUNTER — Telehealth: Payer: Self-pay | Admitting: Obstetrics and Gynecology

## 2019-02-18 NOTE — Telephone Encounter (Signed)
Call placed to convey benefits for ultrasound. Spoke with the patient and conveyed the benefits.Patient understands.agreeable with the benefits.  Patient is aware of the cancellation policy.Appointment scheduled 02/20/19.

## 2019-02-19 ENCOUNTER — Other Ambulatory Visit: Payer: Self-pay

## 2019-02-20 ENCOUNTER — Encounter: Payer: Self-pay | Admitting: Obstetrics and Gynecology

## 2019-02-20 ENCOUNTER — Ambulatory Visit (INDEPENDENT_AMBULATORY_CARE_PROVIDER_SITE_OTHER): Payer: 59

## 2019-02-20 ENCOUNTER — Ambulatory Visit (INDEPENDENT_AMBULATORY_CARE_PROVIDER_SITE_OTHER): Payer: 59 | Admitting: Obstetrics and Gynecology

## 2019-02-20 VITALS — BP 120/68 | HR 76 | Temp 97.9°F | Ht 66.25 in | Wt 154.8 lb

## 2019-02-20 DIAGNOSIS — R591 Generalized enlarged lymph nodes: Secondary | ICD-10-CM

## 2019-02-20 DIAGNOSIS — N926 Irregular menstruation, unspecified: Secondary | ICD-10-CM | POA: Diagnosis not present

## 2019-02-20 DIAGNOSIS — N83202 Unspecified ovarian cyst, left side: Secondary | ICD-10-CM | POA: Diagnosis not present

## 2019-02-20 DIAGNOSIS — N921 Excessive and frequent menstruation with irregular cycle: Secondary | ICD-10-CM

## 2019-02-20 DIAGNOSIS — R0989 Other specified symptoms and signs involving the circulatory and respiratory systems: Secondary | ICD-10-CM

## 2019-02-20 NOTE — Progress Notes (Signed)
GYNECOLOGY  VISIT   HPI: 44 y.o.   Married  Caucasian  female   [phone] with Patient's last menstrual period was 02/18/2019 (exact date).   here for pelvic ultrasound.    She states she has had irregular bleeding for years.  She changed her birth control and this helped but now the bleeding has recurred.  Her bleeding has been irregular for about 2 - 3 months.  She can bleed if she strains to have any bowel movement or is in a rush to have a BM but it is coming from the vagina.  This is always midcycle when it occurs. No pain.  No missed pills.  She wants reassurance that everything is OK.   She has a new concern of a left neck sensitivity.  She noticed a lump.  She wonders if it is a lymph node.  She had dental cleaning and gum probing.   GYNECOLOGIC HISTORY: Patient's last menstrual period was 02/18/2019 (exact date). Contraception: OCPs Menopausal hormone therapy:  n/a Last mammogram:1-8-21Lt.Br.Diag./Neg/screening 20yrBiRads1 Last pap smear:  04-09-18 Neg:Neg HR HPV,02-23-17 neg HPV HR neg , 02-15-16 neg        OB History    Gravida  3   Para  2   Term  2   Preterm  0   AB  1   Living  2     SAB  1   TAB  0   Ectopic  0   Multiple  0   Live Births  2              Patient Active Problem List   Diagnosis Date Noted  . Dysfunctional uterine bleeding, with normal [place]/01/2017  . Change in bowel habits 11/16/2017  . Palpitation 04/30/2017  . Paresthesias/numbness 04/24/2017  . Polyarthralgia 03/28/2017  . Dyspepsia, s/p normal EGD 03/08/17, with recommendation for lifetime anti-reflux regimen 03/26/2017  . History of spinal fusion for scoliosis 03/26/2017  . Malaise and fatigue 03/26/2017  . Tension headache 03/26/2017    Past Medical History:  Diagnosis Date  . 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 Anxiety   . Atypical chest pain 04/24/2017  . Dyspepsia, s/p normal EGD 03/08/17, with recommendation for lifetime anti-reflux regimen 03/26/2017  . GAD  (generalized anxiety disorder) 05/10/2016  . History of spinal fusion for scoliosis 03/26/2017  . Left upper quadrant pain 03/26/2017  . Malaise and fatigue 03/26/2017  . Paresthesias/numbness 04/24/2017  . Polyarthralgia 03/28/2017  . Positive Lyme disease serology   . Seasonal allergies   . Tension headache 03/26/2017    Past Surgical History:  Procedure Laterality Date  . DILATATION & CURRETTAGE/HYSTEROSCOPY WITH RESECTOCOPE  02/12/2012   Procedure: [place]  Surgeon: TAzalia Bilis MD;  Location[facility];  Service: Gynecology;  Laterality: N/A;  . DILATION AND CURETTAGE OF UTERUS  2010  . DILATION AND CURETTAGE OF UTERUS  2012  . SPINAL FUSION  2008    Current Outpatient Medications  Medication Sig Dispense Refill  . cholecalciferol (VITAMIN D) 1000 units tablet Take 1,000 Units by mouth daily.    .Marland KitchenCRANBERRY PO Take by mouth every other day.    . cyanocobalamin 100 MCG tablet Take 100 mcg by mouth every other day.     . cyclobenzaprine (FLEXERIL) 10 MG tablet Take 1 tablet (10 mg total) by mouth at bedtime. 90 tablet 2  . Norethindrone Acetate-Ethinyl Estradiol (MICROGESTIN) 1.5-30 MG-MCG tablet Take 1 tablet by mouth daily. 3 Package 4  .  omeprazole (PRILOSEC) 40 MG capsule TAKE 1 CAPSULE BY MOUTH EVERY DAY 30 capsule 5  . Prenatal Vit-Fe Fumarate-FA (MULTIVITAMIN-PRENATAL) 27-0.8 MG TABS Take 1 tablet by mouth daily.    . valACYclovir (VALTREX) 1000 MG tablet TAKE 1 TABLET (1,000 MG TOTAL) BY MOUTH 2 (TWO) TIMES DAILY AS NEEDED. AS NEEDED 20 tablet 3   No current facility-administered medications for this visit.     ALLERGIES: Zoloft [sertraline hcl]  Family History  Problem Relation Age of Onset  . Diabetes Maternal Grandmother   . Hypertension Maternal Grandmother   . Heart disease Maternal Grandmother   . Heart attack Maternal Grandmother   . Osteoporosis Maternal Grandmother   . Diabetes Maternal Grandfather   . Hypertension  Maternal Grandfather   . Heart disease Maternal Grandfather   . Heart attack Maternal Grandfather   . Hypertension Mother   . Anxiety disorder Mother   . Diabetes Mother   . Heart murmur Brother   . Colon cancer Neg Hx   . Esophageal cancer Neg Hx   . Rectal cancer Neg Hx   . Stomach cancer Neg Hx   . Breast cancer Neg Hx     Social History   Socioeconomic History  . Marital status: Married    Spouse name: Not on file  . Number of children: 2  . Years of education: Not on file  . Highest education level: Not on file  Occupational History  . Occupation: HUMAN RESOURCES    Employer: [employer]  Tobacco Use  . Smoking status: Never Smoker  . Smokeless tobacco: Never Used  Substance and Sexual Activity  . Alcohol use: Yes    Alcohol/week: 1.0 - 2.0 standard drinks    Types: 1 - 2 Standard drinks or equivalent per week  . Drug use: No  . Sexual activity: Yes    Partners: Male    Birth control/protection: OCP  Other Topics Concern  . Not on file  Social History Narrative  . Not on file   Social Determinants of Health   Financial Resource Strain:   . Difficulty of Paying Living Expenses: Not on file  Food Insecurity:   . Worried About Charity fundraiser in the Last Year: Not on file  . Ran Out of Food in the Last Year: Not on file  Transportation Needs:   . Lack of Transportation (Medical): Not on file  . Lack of Transportation (Non-Medical): Not on file  Physical Activity:   . Days of Exercise per Week: Not on file  . Minutes of Exercise per Session: Not on file  Stress:   . Feeling of Stress : Not on file  Social Connections:   . Frequency of Communication with Friends and Family: Not on file  . Frequency of Social Gatherings with Friends and Family: Not on file  . Attends Religious Services: Not on file  . Active Member of Clubs or Organizations: Not on file  . Attends Archivist Meetings: Not on file  . Marital Status: Not on  file  Intimate Partner Violence:   . Fear of Current or Ex-Partner: Not on file  . Emotionally Abused: Not on file  . Physically Abused: Not on file  . Sexually Abused: Not on file    Review of Systems  All other systems reviewed and are negative.   PHYSICAL EXAMINATION:    BP 120/68   Pulse 76 Comment: slightly irregular  Ht 5' 6.25" (1.683 m)   Wt 154  lb 12.8 oz (70.2 kg)   LMP 02/18/2019 (Exact Date) Comment: break thru bleeding 02-05-2019  BMI 24.80 kg/m     General appearance: alert, cooperative and appears stated age   Lymph nodes: Cervical, supraclavicular areas palpated. Bilateral cervical lymph nodes present.  Left cervical node tenderness. All nodes less than 1 cm in size.   [place] Normal uterus with no masses of myometrium.  Endometrium symmetrical 3.94 mm.  No endometrial masses.  Left ovary with 24 mm simple cyst and no abnormal blood flow.  Right ovary normal.  No free fluid.   ASSESSMENT  Breakthrough bleeding on COCs.  Left ovarian simple cyst.  Lymph node tenderness.  Probably related to recent dental work   PLAN  Reassuring pelvic ultrasound findings reviewed including the simple ovarian cyst. She desires to continue on her current COC.  I am ok with this.  I did discuss endometrial biopsy as the next step in her care if the bleeding increases in frequency or number of days that it lasts.  Rationale explained.  Reassurance regarding her lymph node.  She will seek assistance if the node increases in size or tenderness.  Fu prn.    An After Visit Summary was printed and given to the patient.  _15_____ minutes face to face time of which over 50% was spent in counseling.

## 2019-02-20 NOTE — Progress Notes (Signed)
Encounter reviewed by Dr. Jhaniya Briski Amundson C. Silva.  

## 2019-03-10 ENCOUNTER — Other Ambulatory Visit: Payer: Self-pay

## 2019-03-11 ENCOUNTER — Encounter: Payer: Self-pay | Admitting: Family Medicine

## 2019-03-11 ENCOUNTER — Ambulatory Visit (INDEPENDENT_AMBULATORY_CARE_PROVIDER_SITE_OTHER): Payer: 59 | Admitting: Family Medicine

## 2019-03-11 VITALS — BP 110/72 | HR 76 | Temp 97.2°F | Ht 66.25 in | Wt 156.4 lb

## 2019-03-11 DIAGNOSIS — M542 Cervicalgia: Secondary | ICD-10-CM

## 2019-03-11 DIAGNOSIS — M62838 Other muscle spasm: Secondary | ICD-10-CM | POA: Diagnosis not present

## 2019-03-11 DIAGNOSIS — R1013 Epigastric pain: Secondary | ICD-10-CM | POA: Diagnosis not present

## 2019-03-11 DIAGNOSIS — Z13220 Encounter for screening for lipoid disorders: Secondary | ICD-10-CM | POA: Diagnosis not present

## 2019-03-11 DIAGNOSIS — G44209 Tension-type headache, unspecified, not intractable: Secondary | ICD-10-CM | POA: Diagnosis not present

## 2019-03-11 DIAGNOSIS — R059 Cough, unspecified: Secondary | ICD-10-CM

## 2019-03-11 DIAGNOSIS — R05 Cough: Secondary | ICD-10-CM

## 2019-03-11 LAB — COMPREHENSIVE METABOLIC PANEL
ALT: 20 U/L (ref 0–35)
AST: 17 U/L (ref 0–37)
Albumin: 4.5 g/dL (ref 3.5–5.2)
Alkaline Phosphatase: 39 U/L (ref 39–117)
BUN: 12 mg/dL (ref 6–23)
CO2: 27 mEq/L (ref 19–32)
Calcium: 9.5 mg/dL (ref 8.4–10.5)
Chloride: 101 mEq/L (ref 96–112)
Creatinine, Ser: 0.7 mg/dL (ref 0.40–1.20)
GFR: 91.67 mL/min (ref >60.00)
Glucose, Bld: 86 mg/dL (ref 70–99)
Potassium: 3.7 mEq/L (ref 3.5–5.1)
Sodium: 135 mEq/L (ref 135–145)
Total Bilirubin: 0.4 mg/dL (ref 0.2–1.2)
Total Protein: 7.4 g/dL (ref 6.0–8.3)

## 2019-03-11 LAB — VITAMIN B12: Vitamin B-12: 869 pg/mL (ref 211–911)

## 2019-03-11 LAB — CBC
HCT: 38.7 % (ref 36.0–46.0)
Hemoglobin: 13.4 g/dL (ref 12.0–15.0)
MCHC: 34.5 g/dL (ref 30.0–36.0)
MCV: 90.8 fl (ref 78.0–100.0)
Platelets: 285 10*3/uL (ref 150.0–400.0)
RBC: 4.26 Mil/uL (ref 3.87–5.11)
RDW: 13.1 % (ref 11.5–15.5)
WBC: 7.9 10*3/uL (ref 4.0–10.5)

## 2019-03-11 LAB — LIPID PANEL
Cholesterol: 200 mg/dL (ref 0–200)
HDL: 58.6 mg/dL (ref >39.00)
LDL Cholesterol: 116 mg/dL — ABNORMAL HIGH (ref 0–99)
NonHDL: 141.77
Total CHOL/HDL Ratio: 3
Triglycerides: 129 mg/dL (ref 0.0–149.0)
VLDL: 25.8 mg/dL (ref 0.0–40.0)

## 2019-03-11 LAB — VITAMIN D 25 HYDROXY (VIT D DEFICIENCY, FRACTURES): VITD: 74.57 ng/mL (ref 30.00–100.00)

## 2019-03-11 LAB — SARS-COV-2 IGG: SARS-COV-2 IgG: 0.69

## 2019-03-11 LAB — TSH: TSH: 1.17 u[IU]/mL (ref 0.35–4.50)

## 2019-03-11 MED ORDER — CYCLOBENZAPRINE HCL 10 MG PO TABS: 10.0000 mg | ORAL_TABLET | Freq: Every day | ORAL | 2 refills | Status: DC

## 2019-03-11 NOTE — Assessment & Plan Note (Signed)
No red flags.  Likely due to cervical pain.  Will refill Flexeril as this is worked well for her in the past.

## 2019-03-11 NOTE — Progress Notes (Signed)
   Alison Fleming is a 44 y.o. female who presents today for an office visit.  Assessment/Plan:  Chronic Problems Addressed Today: Tension headache No red flags.  Likely due to cervical pain.  Will refill Flexeril as this is worked well for her in the past.  Cervical pain with history of scoliosis Refill Flexeril as above.  No red flags.  Dyspepsia, s/p normal EGD 03/08/17, with recommendation for lifetime anti-reflux regimen Stable.  Will continue omeprazole daily.  Will check CBC, C met, TSH, vitamin D, and vitamin B12.  Will also check for Covid antibody per patient request.  Had an episode last year with cough and sinusitis.  Has had intermittent anosmia since then.    Subjective:  HPI:  Patient is here for follow-up today.  She has been taking Flexeril for neck spasms and chest pain.  This has been working well for her.  She only takes a few times per week.  She is concerned about building a tolerance to this medication.  She is also taking omeprazole daily for dyspepsia.  This is working well but she is also concerned about putting up a tolerance.  She has had an EGD in the past and was reportedly told that she need to be on lifelong PPI.  She would like to have blood work done today.       Objective:  Physical Exam: BP 110/72   Pulse 76   Temp (!) 97.2 F (36.2 C)   Ht 5' 6.25" (1.683 m)   Wt 156 lb 6.1 oz (70.9 kg)   LMP 02/18/2019 (Exact Date) Comment: break thru bleeding 02-05-2019  SpO2 96%   BMI 25.05 kg/m   Gen: No acute distress, resting comfortably CV: Regular rate and rhythm with no murmurs appreciated Pulm: Normal work of breathing, clear to auscultation bilaterally with no crackles, wheezes, or rhonchi Neuro: Grossly normal, moves all extremities Psych: Normal affect and thought content      [place] Norberto M. Jerline Pain, MD 03/11/2019 2:57 PM

## 2019-03-11 NOTE — Assessment & Plan Note (Signed)
Refill Flexeril as above.  No red flags.

## 2019-03-11 NOTE — Assessment & Plan Note (Signed)
Stable.  Will continue omeprazole daily.  Will check CBC, C met, TSH, vitamin D, and vitamin B12. 

## 2019-03-11 NOTE — Patient Instructions (Signed)
It was very nice to see you today!  I will refill your Flexeril.  We will check blood work today.  Please let us know if your symptoms are not improving.  Take care, Dr Jerline Pain  Please try these tips to maintain a healthy lifestyle:   Eat at least 3 REAL meals and 1-2 snacks per day.  Aim for no more than 5 hours between eating.  If you eat breakfast, please do so within one hour of getting up.    Each meal should contain half fruits/vegetables, one quarter protein, and one quarter carbs (no bigger than a computer mouse)   Cut down on sweet beverages. This includes juice, soda, and sweet tea.     Drink at least 1 glass of water with each meal and aim for at least 8 glasses per day   Exercise at least 150 minutes every week.

## 2019-03-12 ENCOUNTER — Other Ambulatory Visit: Payer: Self-pay

## 2019-03-12 ENCOUNTER — Telehealth: Payer: Self-pay

## 2019-03-12 MED ORDER — OMEPRAZOLE 40 MG PO CPDR: DELAYED_RELEASE_CAPSULE | ORAL | 0 refills | Status: DC

## 2019-03-12 NOTE — Telephone Encounter (Signed)
MEDICATION:omeprazole (PRILOSEC) 40 MG capsule  PHARMACY: [id number] [place], [place] - [address] Phone:  [phone] Fax:  [phone]       Comments: TOC 05/09/19  **Let patient know to contact pharmacy at the end of the day to make sure medication is ready. **  ** Please notify patient to allow 48-72 hours to process**  **Encourage patient to contact the pharmacy for refills or [facility]*

## 2019-03-12 NOTE — Progress Notes (Signed)
Please inform patient of the following:  Her "bad" cholesterol is slightly elevated but all of her other labs are NORMAL. Would like for her to continue working on diet and exercise and we can recheck in 6-12 months.  Algis Greenhouse. Jerline Pain, MD 03/12/2019 9:09 AM

## 2019-03-12 NOTE — Telephone Encounter (Signed)
Rx sent 

## 2019-03-19 ENCOUNTER — Ambulatory Visit (INDEPENDENT_AMBULATORY_CARE_PROVIDER_SITE_OTHER): Payer: 59 | Admitting: Physician Assistant

## 2019-03-19 ENCOUNTER — Encounter: Payer: Self-pay | Admitting: Physician Assistant

## 2019-03-19 ENCOUNTER — Other Ambulatory Visit: Payer: Self-pay

## 2019-03-19 VITALS — BP 130/82 | HR 83 | Temp 97.9°F | Ht 66.25 in | Wt 156.0 lb

## 2019-03-19 DIAGNOSIS — R42 Dizziness and giddiness: Secondary | ICD-10-CM | POA: Diagnosis not present

## 2019-03-19 DIAGNOSIS — R519 Headache, unspecified: Secondary | ICD-10-CM | POA: Diagnosis not present

## 2019-03-19 LAB — POC URINALSYSI DIPSTICK (AUTOMATED)
Bilirubin, UA: NEGATIVE
Blood, UA: NEGATIVE
Glucose, UA: NEGATIVE
Ketones, UA: NEGATIVE
Leukocytes, UA: NEGATIVE
Nitrite, UA: NEGATIVE
Protein, UA: NEGATIVE
Spec Grav, UA: 1.01 (ref 1.010–1.025)
Urobilinogen, UA: 0.2 E.U./dL
pH, UA: 6.5 (ref 5.0–8.0)

## 2019-03-19 NOTE — Patient Instructions (Signed)
It was great to see you!  Please obtain CT scan as scheduled, unless you feel like your symptoms have resolved.  Any worsening in the meantime, please go to the ER.

## 2019-03-19 NOTE — Progress Notes (Signed)
Alison Fleming is a 44 y.o. female here for a new problem.  I acted as a Education administrator for [employer], PA-C Anselmo Pickler, LPN  History of Present Illness:   Chief Complaint  Patient presents with  . Dizziness    HPI   Dizziness Pt c/o dizziness started couple days ago. Pt is having dull headache top of head since yesterday, currently 3/10, not the worst HA of her life, no thunderclap onset, and ongoing sensation of feeling "off balance" past 4-5 days. Room is spinning when laying down at times. Pt sits at a computer all day and says she does not think it is vertigo. She also has awakened the past two nights in cold sweats. Also c/o ringing in her left ear twice in the last two days off and on, gets better with activity and worse when she is sitting.  Patient's last menstrual period was 03/18/2019.  Denies nausea, CP, SOB, trauma to head, family history of stroke, vision changes, double vision.  Does endorse significant anxiety.  Denies any significant medication changes other than recent increase in flexeril use, but has not taken any since Saturday.  Past Medical History:  Diagnosis Date  . 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 Anxiety   . Atypical chest pain 04/24/2017  . Dyspepsia, s/p normal EGD 03/08/17, with recommendation for lifetime anti-reflux regimen 03/26/2017  . GAD (generalized anxiety disorder) 05/10/2016  . History of spinal fusion for scoliosis 03/26/2017  . Left upper quadrant pain 03/26/2017  . Malaise and fatigue 03/26/2017  . Paresthesias/numbness 04/24/2017  . Polyarthralgia 03/28/2017  . Positive Lyme disease serology   . Seasonal allergies   . Tension headache 03/26/2017     Social History   Socioeconomic History  . Marital status: Married    Spouse name: Not on file  . Number of children: 2  . Years of education: Not on file  . Highest education level: Not on file  Occupational History  . Occupation: HUMAN RESOURCES    Employer: [employer]   Tobacco Use  . Smoking status: Never Smoker  . Smokeless tobacco: Never Used  Substance and Sexual Activity  . Alcohol use: Yes    Alcohol/week: 1.0 - 2.0 standard drinks    Types: 1 - 2 Standard drinks or equivalent per week  . Drug use: No  . Sexual activity: Yes    Partners: Male    Birth control/protection: OCP  Other Topics Concern  . Not on file  Social History Narrative  . Not on file   Social Determinants of Health   Financial Resource Strain:   . Difficulty of Paying Living Expenses: Not on file  Food Insecurity:   . Worried About Charity fundraiser in the Last Year: Not on file  . Ran Out of Food in the Last Year: Not on file  Transportation Needs:   . Lack of Transportation (Medical): Not on file  . Lack of Transportation (Non-Medical): Not on file  Physical Activity:   . Days of Exercise per Week: Not on file  . Minutes of Exercise per Session: Not on file  Stress:   . Feeling of Stress : Not on file  Social Connections:   . Frequency of Communication with Friends and Family: Not on file  . Frequency of Social Gatherings with Friends and Family: Not on file  . Attends Religious Services: Not on file  . Active Member of Clubs or Organizations: Not on file  . Attends Archivist  Meetings: Not on file  . Marital Status: Not on file  Intimate Partner Violence:   . Fear of Current or Ex-Partner: Not on file  . Emotionally Abused: Not on file  . Physically Abused: Not on file  . Sexually Abused: Not on file    Past Surgical History:  Procedure Laterality Date  . DILATATION & CURRETTAGE/HYSTEROSCOPY WITH RESECTOCOPE  02/12/2012   Procedure: [place];  Surgeon: Azalia Bilis, MD;  Location: [facility];  Service: Gynecology;  Laterality: N/A;  . DILATION AND CURETTAGE OF UTERUS  2010  . DILATION AND CURETTAGE OF UTERUS  2012  . SPINAL FUSION  2008    Family History  Problem Relation Age of Onset  .  Diabetes Maternal Grandmother   . Hypertension Maternal Grandmother   . Heart disease Maternal Grandmother   . Heart attack Maternal Grandmother   . Osteoporosis Maternal Grandmother   . Diabetes Maternal Grandfather   . Hypertension Maternal Grandfather   . Heart disease Maternal Grandfather   . Heart attack Maternal Grandfather   . Hypertension Mother   . Anxiety disorder Mother   . Diabetes Mother   . Heart murmur Brother   . Colon cancer Neg Hx   . Esophageal cancer Neg Hx   . Rectal cancer Neg Hx   . Stomach cancer Neg Hx   . Breast cancer Neg Hx     Allergies  Allergen Reactions  . Zoloft [Sertraline Hcl] Other (See Comments)    Sedation    Current Medications:   Current Outpatient Medications:  .  cholecalciferol (VITAMIN D) 1000 units tablet, Take 1,000 Units by mouth daily., Disp: , Rfl:  .  CRANBERRY PO, Take by mouth every other day., Disp: , Rfl:  .  cyanocobalamin 100 MCG tablet, Take 100 mcg by mouth every other day. , Disp: , Rfl:  .  cyclobenzaprine (FLEXERIL) 10 MG tablet, Take 1 tablet (10 mg total) by mouth at bedtime., Disp: 90 tablet, Rfl: 2 .  Norethindrone Acetate-Ethinyl Estradiol (MICROGESTIN) 1.5-30 MG-MCG tablet, Take 1 tablet by mouth daily., Disp: 3 Package, Rfl: 4 .  omeprazole (PRILOSEC) 40 MG capsule, TAKE 1 CAPSULE BY MOUTH EVERY DAY, Disp: 90 capsule, Rfl: 0 .  Prenatal Vit-Fe Fumarate-FA (MULTIVITAMIN-PRENATAL) 27-0.8 MG TABS, Take 1 tablet by mouth daily., Disp: , Rfl:  .  valACYclovir (VALTREX) 1000 MG tablet, TAKE 1 TABLET (1,000 MG TOTAL) BY MOUTH 2 (TWO) TIMES DAILY AS NEEDED. AS NEEDED, Disp: 20 tablet, Rfl: 3   Review of Systems:   ROS  Negative unless otherwise specified per HPI.  Vitals:   Vitals:   03/19/19 1530  BP: 130/82  Pulse: 83  Temp: 97.9 F (36.6 C)  TempSrc: Temporal  SpO2: 97%  Weight: 156 lb (70.8 kg)  Height: 5' 6.25" (1.683 m)     Body mass index is 24.99 kg/m.   Orthostatic VS for the past 24 hrs  (Last 3 readings):  BP- Lying Pulse- Lying BP- Sitting Pulse- Sitting BP- Standing at 0 minutes Pulse- Standing at 0 minutes  03/19/19 1606 120/80 78 126/80 86 102/70 96    Physical Exam:   Physical Exam Vitals and nursing note reviewed.  Constitutional:      General: She is not in acute distress.    Appearance: She is well-developed. She is not ill-appearing or toxic-appearing.  Cardiovascular:     Rate and Rhythm: Normal rate and regular rhythm.     Pulses: Normal pulses.  Heart sounds: Normal heart sounds, S1 normal and S2 normal.     Comments: No LE edema Pulmonary:     Effort: Pulmonary effort is normal.     Breath sounds: Normal breath sounds.  Skin:    General: Skin is warm and dry.  Neurological:     General: No focal deficit present.     Mental Status: She is alert.     GCS: GCS eye subscore is 4. GCS verbal subscore is 5. GCS motor subscore is 6.     Cranial Nerves: Cranial nerves are intact.     Sensory: Sensation is intact.     Motor: Motor function is intact.     Coordination: Coordination is intact. Romberg sign negative.     Gait: Gait is intact.     Comments: Marye Round Negative b/l  Psychiatric:        Attention and Perception: Attention normal.        Mood and Affect: Mood is anxious.        Speech: Speech normal.        Behavior: Behavior normal. Behavior is cooperative.        Thought Content: Thought content normal.       Assessment and Plan:   Yeraldine was seen today for dizziness.  Diagnoses and all orders for this visit:  Episodic lightheadedness; Nonintractable headache, unspecified chronicity pattern, unspecified headache type Neuro exam benign. Unclear etiology of symptoms. Discussed adequate hydration and nutrition, rest and avoiding reading about symptoms on the internet. Discussed CT scan of head to r/o mass, CVA, or other etiology of symptoms and she was agreeable. CT scan ordered. Strict ER precautions discussed if symptoms progress  or change. -     POCT Urinalysis Dipstick (Automated) -     CT Head Wo Contrast; Future  . Reviewed expectations re: course of current medical issues. . Discussed self-management of symptoms. . Outlined signs and symptoms indicating need for more acute intervention. . Patient verbalized understanding and all questions were answered. . See orders for this visit as documented in the electronic medical record. . Patient received an After-Visit Summary.  CMA or LPN served as scribe during this visit. History, Physical, and Plan performed by medical provider. The above documentation has been reviewed and is accurate and complete.  [name], PA-C

## 2019-03-21 ENCOUNTER — Other Ambulatory Visit: Payer: Self-pay

## 2019-03-21 ENCOUNTER — Ambulatory Visit (HOSPITAL_COMMUNITY): Payer: 59

## 2019-03-21 ENCOUNTER — Ambulatory Visit (INDEPENDENT_AMBULATORY_CARE_PROVIDER_SITE_OTHER)
Admission: RE | Admit: 2019-03-21 | Discharge: 2019-03-21 | Disposition: A | Discharge status: Home / Self Care | Payer: 59 | Source: Ambulatory Visit | Attending: Physician Assistant | Admitting: Physician Assistant

## 2019-03-21 DIAGNOSIS — R519 Headache, unspecified: Secondary | ICD-10-CM | POA: Diagnosis not present

## 2019-03-25 ENCOUNTER — Ambulatory Visit: Payer: 59 | Admitting: Certified Nurse Midwife

## 2019-03-28 ENCOUNTER — Other Ambulatory Visit: Payer: Self-pay | Admitting: Certified Nurse Midwife

## 2019-03-28 DIAGNOSIS — Z3041 Encounter for surveillance of contraceptive pills: Secondary | ICD-10-CM

## 2019-03-28 NOTE — Progress Notes (Signed)
44 y.o. [id number] Married  Caucasian Fe here for annual exam. Periods regular and still has the spotting midcycle, at same time seems less. Recent CT for dizziness and all normal,,and blood work profile is normal. Contraception working well.  Had PUS all normal due to spotting all normal. Now that she knows no issues feels comfortable with OCP continuance. Sees PCP yearly with labs as needed. No other health issues today.  Patient's last menstrual period was 03/18/2019.          Sexually active: Yes.    The current method of family planning is OCP (estrogen/progesterone).    Exercising: No.  exercise Smoker:  no  Review of Systems  Constitutional: Negative.   HENT: Negative.   Eyes: Negative.   Respiratory: Negative.   Cardiovascular: Negative.   Gastrointestinal: Negative.   Genitourinary: Negative.   Musculoskeletal: Negative.   Skin: Negative.   Neurological: Negative.   Endo/Heme/Allergies: Negative.   Psychiatric/Behavioral: Negative.     Health Maintenance: Pap:  02-23-17 neg HPV HR neg, (request paps yearly), 03-20-2018 neg HPV HR neg History of Abnormal Pap: no MMG:  1/21 bilateral & left breast category c density birads 1:neg Self Breast exams: yes Colonoscopy:  11/19 BMD:   none TDaP:  2017 Shingles: no Pneumonia: no Hep C and HIV: done during pregnancy Labs: PCP   reports that she has never smoked. She has never used smokeless tobacco. She reports current alcohol use of about 1.0 - 2.0 standard drinks of alcohol per week. She reports that she does not use drugs.  Past Medical History:  Diagnosis Date  . 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 Anxiety   . Atypical chest pain 04/24/2017  . Dyspepsia, s/p normal EGD 03/08/17, with recommendation for lifetime anti-reflux regimen 03/26/2017  . GAD (generalized anxiety disorder) 05/10/2016  . History of spinal fusion for scoliosis 03/26/2017  . Left upper quadrant pain 03/26/2017  . Malaise and fatigue 03/26/2017  .  Paresthesias/numbness 04/24/2017  . Polyarthralgia 03/28/2017  . Positive Lyme disease serology   . Seasonal allergies   . Tension headache 03/26/2017    Past Surgical History:  Procedure Laterality Date  . DILATATION & CURRETTAGE/HYSTEROSCOPY WITH RESECTOCOPE  02/12/2012   Procedure: [place];  Surgeon: Azalia Bilis, MD;  Location: [facility];  Service: Gynecology;  Laterality: N/A;  . DILATION AND CURETTAGE OF UTERUS  2010  . DILATION AND CURETTAGE OF UTERUS  2012  . SPINAL FUSION  2008    Current Outpatient Medications  Medication Sig Dispense Refill  . cholecalciferol (VITAMIN D) 1000 units tablet Take 1,000 Units by mouth daily.    [name] CRANBERRY PO Take by mouth every other day.    . cyanocobalamin 100 MCG tablet Take 100 mcg by mouth every other day.     . cyclobenzaprine (FLEXERIL) 10 MG tablet Take 1 tablet (10 mg total) by mouth at bedtime. 90 tablet 2  . MICROGESTIN 1.5-30 MG-MCG tablet TAKE 1 TABLET BY MOUTH EVERY DAY 1 Package 0  . omeprazole (PRILOSEC) 40 MG capsule TAKE 1 CAPSULE BY MOUTH EVERY DAY 90 capsule 0  . Prenatal Vit-Fe Fumarate-FA (MULTIVITAMIN-PRENATAL) 27-0.8 MG TABS Take 1 tablet by mouth daily.    . valACYclovir (VALTREX) 1000 MG tablet TAKE 1 TABLET (1,000 MG TOTAL) BY MOUTH 2 (TWO) TIMES DAILY AS NEEDED. AS NEEDED 20 tablet 3   No current facility-administered medications for this visit.    Family History  Problem Relation Age of Onset  . Diabetes  Maternal Grandmother   . Hypertension Maternal Grandmother   . Heart disease Maternal Grandmother   . Heart attack Maternal Grandmother   . Osteoporosis Maternal Grandmother   . Diabetes Maternal Grandfather   . Hypertension Maternal Grandfather   . Heart disease Maternal Grandfather   . Heart attack Maternal Grandfather   . Hypertension Mother   . Anxiety disorder Mother   . Diabetes Mother   . Heart murmur Brother   . Colon cancer Neg Hx   . Esophageal cancer  Neg Hx   . Rectal cancer Neg Hx   . Stomach cancer Neg Hx   . Breast cancer Neg Hx     ROS:  Pertinent items are noted in HPI.  Otherwise, a comprehensive ROS was negative.  Exam:   LMP 03/18/2019 Comment: break thru bleeding 02-05-2019   Ht Readings from Last 3 Encounters:  03/19/19 5' 6.25" (1.683 m)  03/11/19 5' 6.25" (1.683 m)  02/20/19 5' 6.25" (1.683 m)    General appearance: alert, cooperative and appears stated age Head: Normocephalic, without obvious abnormality, atraumatic Neck: no adenopathy, supple, symmetrical, trachea midline and thyroid normal to inspection and palpation Lungs: clear to auscultation bilaterally Breasts: normal appearance, no masses or tenderness, No nipple retraction or dimpling, No nipple discharge or bleeding, No axillary or supraclavicular adenopathy Heart: regular rate and rhythm Abdomen: soft, non-tender; no masses,  no organomegaly Extremities: extremities normal, atraumatic, no cyanosis or edema Skin: Skin color, texture, turgor normal. No rashes or lesions Lymph nodes: Cervical, supraclavicular, and axillary nodes normal. No abnormal inguinal nodes palpated Neurologic: Grossly normal   Pelvic: External genitalia:  no lesions              Urethra:  normal appearing urethra with no masses, tenderness or lesions              Bartholin's and Skene's: normal                 Vagina: normal appearing vagina with normal color and discharge, no lesions              Cervix: no cervical motion tenderness, no lesions and normal appearance              Pap taken: Yes.   Bimanual Exam:  Uterus:  normal size, contour, position, consistency, mobility, non-tender and anteverted              Adnexa: normal adnexa and no mass, fullness, tenderness               Rectovaginal: Confirms               Anus:  normal sphincter tone, no lesions  Chaperone present: yes  A:  Well Woman with normal exam  Contraception OCP desired  Midcycle spotting evaluated  with PUS all normal no issues  No HSV outbreaks, no Valtrex update needed  Vertigo evaluated by PCP no issues.    P:   Reviewed health and wellness pertinent to exam  Risks/benefits/warning signs with OCP use reviewed.  Rx Microgestin see order with instructions  Continue follow up with PCP as indicated, Discussed seeing ENT regarding if continues.  Pap smear: yes   counseled on breast self exam, mammography screening, feminine hygiene, use and side effects of OCP's, adequate intake of calcium and vitamin D, diet and exercise  return annually or prn  An After Visit Summary was printed and given to the patient.

## 2019-03-28 NOTE — Telephone Encounter (Signed)
Medication refill request: microgestin 1.5-30 Last AEX:  03-19-2018 Next AEX: 03-31-2019 Last MMG (if hormonal medication request): 01/2019 bilateral & left breast category c density birads 1:neg Refill authorized: please approve 1 pack if appropriate

## 2019-03-31 ENCOUNTER — Encounter: Payer: Self-pay | Admitting: Certified Nurse Midwife

## 2019-03-31 ENCOUNTER — Ambulatory Visit (INDEPENDENT_AMBULATORY_CARE_PROVIDER_SITE_OTHER): Payer: 59 | Admitting: Certified Nurse Midwife

## 2019-03-31 ENCOUNTER — Other Ambulatory Visit (HOSPITAL_COMMUNITY)
Admission: RE | Admit: 2019-03-31 | Discharge: 2019-03-31 | Disposition: A | Discharge status: Home / Self Care | Payer: 59 | Source: Ambulatory Visit | Attending: Certified Nurse Midwife | Admitting: Certified Nurse Midwife

## 2019-03-31 ENCOUNTER — Other Ambulatory Visit: Payer: Self-pay

## 2019-03-31 VITALS — BP 104/68 | HR 68 | Temp 98.1°F | Resp 16 | Ht 66.25 in | Wt 155.0 lb

## 2019-03-31 DIAGNOSIS — Z01419 Encounter for gynecological examination (general) (routine) without abnormal findings: Secondary | ICD-10-CM | POA: Diagnosis not present

## 2019-03-31 DIAGNOSIS — Z3041 Encounter for surveillance of contraceptive pills: Secondary | ICD-10-CM | POA: Diagnosis not present

## 2019-03-31 DIAGNOSIS — Z124 Encounter for screening for malignant neoplasm of cervix: Secondary | ICD-10-CM | POA: Diagnosis not present

## 2019-03-31 MED ORDER — NORETHINDRONE ACET-ETHINYL EST 1.5-30 MG-MCG PO TABS: 1.0000 | ORAL_TABLET | Freq: Every day | ORAL | 4 refills | Status: DC

## 2019-03-31 NOTE — Patient Instructions (Signed)

## 2019-04-01 LAB — CYTOLOGY - PAP
Comment: NEGATIVE
Diagnosis: NEGATIVE
High risk HPV: NEGATIVE

## 2019-04-04 ENCOUNTER — Encounter: Payer: Self-pay | Admitting: Certified Nurse Midwife

## 2019-05-09 ENCOUNTER — Encounter: Payer: Self-pay | Admitting: Family Medicine

## 2019-05-09 ENCOUNTER — Ambulatory Visit (INDEPENDENT_AMBULATORY_CARE_PROVIDER_SITE_OTHER): Payer: 59 | Admitting: Family Medicine

## 2019-05-09 ENCOUNTER — Other Ambulatory Visit: Payer: Self-pay

## 2019-05-09 VITALS — BP 120/70 | HR 78 | Temp 98.0°F | Ht 66.25 in | Wt 158.2 lb

## 2019-05-09 DIAGNOSIS — Z0001 Encounter for general adult medical examination with abnormal findings: Secondary | ICD-10-CM | POA: Diagnosis not present

## 2019-05-09 DIAGNOSIS — H9201 Otalgia, right ear: Secondary | ICD-10-CM

## 2019-05-09 MED ORDER — CIPROFLOXACIN-DEXAMETHASONE 0.3-0.1 % OT SUSP: 4.0000 [drp] | Freq: Two times a day (BID) | OTIC | 0 refills | Status: DC

## 2019-05-09 NOTE — Progress Notes (Signed)
Chief Complaint:  Alison Fleming is a 44 y.o. female who presents today for her annual comprehensive physical exam.    Assessment/Plan:  New/Acute Problems: Otalgia Likely external auditory ear canal irritation due to earwax removal.  Will start Ciprodex drops.  If not proving the next couple weeks would consider referral to ENT.  Body mass index is 25.34 kg/m. / Overweight BMI Metric Follow Up - 05/09/19 0926      BMI Metric Follow Up-Please document annually   BMI Metric Follow Up  Education provided        Preventative Healthcare: Up-to-date on vaccines and screenings.  Discussed Covid vaccine.  Patient Counseling(The following topics were reviewed and/or handout was given):  -Nutrition: Stressed importance of moderation in sodium/caffeine intake, saturated fat and cholesterol, caloric balance, sufficient intake of fresh fruits, vegetables, and fiber.  -Stressed the importance of regular exercise.   -Substance Abuse: Discussed cessation/primary prevention of tobacco, alcohol, or other drug use; driving or other dangerous activities under the influence; availability of treatment for abuse.   -Injury prevention: Discussed safety belts, safety helmets, smoke detector, smoking near bedding or upholstery.   -Sexuality: Discussed sexually transmitted diseases, partner selection, use of condoms, avoidance of unintended pregnancy and contraceptive alternatives.   -Dental health: Discussed importance of regular tooth brushing, flossing, and dental visits.  -Health maintenance and immunizations reviewed. Please refer to Health maintenance section.  Return to care in 1 year for next preventative visit.     Subjective:  HPI:  She has had some left-sided ear pain for the last few days.  Had similar symptoms about a month ago and of getting a head CT scan which was negative.  She was having some vertigo symptoms at that time.  She has been using over-the-counter earwax removal kit.  She  thinks she may have irritated her ear canal.  No hearing loss. Lifestyle Diet: Lots of fruits.  Exercise: Started going back to the gym. Working with Physiological scientist.   Depression screen [facility] 2/9 10/02/2018  Decreased Interest 0  Down, Depressed, Hopeless 0  PHQ - 2 Score 0  Altered sleeping 0  Tired, decreased energy 0  Change in appetite 2  Feeling bad or failure about yourself  0  Trouble concentrating 1  Moving slowly or fidgety/restless 0  Suicidal thoughts 0  PHQ-9 Score 3  Difficult doing work/chores Not difficult at all    There are no preventive care reminders to display for this patient.   ROS: Per HPI, otherwise a complete review of systems was negative.   PMH:  The following were reviewed and entered/updated in epic: Past Medical History:  Diagnosis Date  . 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 Anxiety   . Atypical chest pain 04/24/2017  . Dyspepsia, s/p normal EGD 03/08/17, with recommendation for lifetime anti-reflux regimen 03/26/2017  . GAD (generalized anxiety disorder) 05/10/2016  . History of spinal fusion for scoliosis 03/26/2017  . Left upper quadrant pain 03/26/2017  . Malaise and fatigue 03/26/2017  . Paresthesias/numbness 04/24/2017  . Polyarthralgia 03/28/2017  . Positive Lyme disease serology   . Seasonal allergies   . Tension headache 03/26/2017   Patient Active Problem List   Diagnosis Date Noted  . Dysfunctional uterine bleeding, with normal [place] 11/16/2017  . Change in bowel habits 11/16/2017  . Palpitation 04/30/2017  . Paresthesias/numbness 04/24/2017  . Polyarthralgia 03/28/2017  . Dyspepsia, s/p normal EGD 03/08/17, with recommendation for lifetime anti-reflux regimen 03/26/2017  . Cervical pain with history of scoliosis 03/26/2017  .  Malaise and fatigue 03/26/2017  . Tension headache 03/26/2017   Past Surgical History:  Procedure Laterality Date  . DILATATION & CURRETTAGE/HYSTEROSCOPY WITH RESECTOCOPE  02/12/2012   Procedure: [place];  Surgeon: Azalia Bilis, MD;  Location: [facility];  Service: Gynecology;  Laterality: N/A;  . DILATION AND CURETTAGE OF UTERUS  2010  . DILATION AND CURETTAGE OF UTERUS  2012  . SPINAL FUSION  2008    Family History  Problem Relation Age of Onset  . Diabetes Maternal Grandmother   . Hypertension Maternal Grandmother   . Heart disease Maternal Grandmother   . Heart attack Maternal Grandmother   . Osteoporosis Maternal Grandmother   . Diabetes Maternal Grandfather   . Hypertension Maternal Grandfather   . Heart disease Maternal Grandfather   . Heart attack Maternal Grandfather   . Hypertension Mother   . Anxiety disorder Mother   . Diabetes Mother   . Heart murmur Brother   . Colon cancer Neg Hx   . Esophageal cancer Neg Hx   . Rectal cancer Neg Hx   . Stomach cancer Neg Hx   . Breast cancer Neg Hx     Medications- reviewed and updated Current Outpatient Medications  Medication Sig Dispense Refill  . CRANBERRY PO Take by mouth.     . cyanocobalamin 100 MCG tablet Take 100 mcg by mouth every other day.     . cyclobenzaprine (FLEXERIL) 10 MG tablet Take 1 tablet (10 mg total) by mouth at bedtime. 90 tablet 2  . Norethindrone Acetate-Ethinyl Estradiol (MICROGESTIN) 1.5-30 MG-MCG tablet Take 1 tablet by mouth daily. 3 Package 4  . omeprazole (PRILOSEC) 40 MG capsule TAKE 1 CAPSULE BY MOUTH EVERY DAY 90 capsule 0  . Prenatal Vit-Fe Fumarate-FA (MULTIVITAMIN-PRENATAL) 27-0.8 MG TABS Take 1 tablet by mouth daily.    . valACYclovir (VALTREX) 1000 MG tablet TAKE 1 TABLET (1,000 MG TOTAL) BY MOUTH 2 (TWO) TIMES DAILY AS NEEDED. AS NEEDED 20 tablet 3  . VITAMIN D PO Take by mouth.    . ciprofloxacin-dexamethasone (CIPRODEX) OTIC suspension Place 4 drops into the right ear 2 (two) times daily. 7.5 mL 0   No current facility-administered medications for this visit.    Allergies-reviewed and updated No Known Allergies  Social History    Socioeconomic History  . Marital status: Married    Spouse name: Not on file  . Number of children: 2  . Years of education: Not on file  . Highest education level: Not on file  Occupational History  . Occupation: HUMAN RESOURCES    Employer: [employer]  Tobacco Use  . Smoking status: Never Smoker  . Smokeless tobacco: Never Used  Substance and Sexual Activity  . Alcohol use: Yes    Alcohol/week: 1.0 - 2.0 standard drinks    Types: 1 - 2 Standard drinks or equivalent per week  . Drug use: No  . Sexual activity: Yes    Partners: Male    Birth control/protection: OCP  Other Topics Concern  . Not on file  Social History Narrative  . Not on file   Social Determinants of Health   Financial Resource Strain:   . Difficulty of Paying Living Expenses:   Food Insecurity:   . Worried About Charity fundraiser in the Last Year:   . Arboriculturist in the Last Year:   Transportation Needs:   . Film/video editor (Medical):   [name] Lack of Transportation (Non-Medical):  Physical Activity:   . Days of Exercise per Week:   . Minutes of Exercise per Session:   Stress:   . Feeling of Stress :   Social Connections:   . Frequency of Communication with Friends and Family:   . Frequency of Social Gatherings with Friends and Family:   . Attends Religious Services:   . Active Member of Clubs or Organizations:   . Attends Archivist Meetings:   [name] Marital Status:         Objective:  Physical Exam: BP 120/70 (BP Location: Left Arm, Patient Position: Sitting, Cuff Size: Normal)   Pulse 78   Temp 98 F (36.7 C) (Temporal)   Ht 5' 6.25" (1.683 m)   Wt 158 lb 3.2 oz (71.8 kg)   LMP 04/15/2019   SpO2 99%   BMI 25.34 kg/m   Body mass index is 25.34 kg/m. Wt Readings from Last 3 Encounters:  05/09/19 158 lb 3.2 oz (71.8 kg)  03/31/19 155 lb (70.3 kg)  03/19/19 156 lb (70.8 kg)   Gen: NAD, resting comfortably HEENT: Right TM clear.  Left EAC with  cerumen and signs of irritation.  CV: RRR with no murmurs appreciated Pulm: NWOB, CTAB with no crackles, wheezes, or rhonchi GI: Normal bowel sounds present. Soft, Nontender, Nondistended. MSK: no edema, cyanosis, or clubbing noted Skin: warm, dry Neuro: CN2-12 grossly intact. Strength 5/5 in upper and lower extremities. Reflexes symmetric and intact bilaterally.  Psych: Normal affect and thought content     Aava Deland M. Jerline Pain, MD 05/09/2019 9:26 AM

## 2019-05-09 NOTE — Patient Instructions (Signed)
It was very nice to see you today!  Please start the ciprodex.   Let me know if not improving in the next 1 to 2 weeks.  Take care, Dr Jerline Pain  Please try these tips to maintain a healthy lifestyle:   Eat at least 3 REAL meals and 1-2 snacks per day.  Aim for no more than 5 hours between eating.  If you eat breakfast, please do so within one hour of getting up.    Each meal should contain half fruits/vegetables, one quarter protein, and one quarter carbs (no bigger than a computer mouse)   Cut down on sweet beverages. This includes juice, soda, and sweet tea.     Drink at least 1 glass of water with each meal and aim for at least 8 glasses per day   Exercise at least 150 minutes every week.    Preventive Care 68-39 Years Old, Female Preventive care refers to visits with your health care provider and lifestyle choices that can promote health and wellness. This includes:  A yearly physical exam. This may also be called an annual well check.  Regular dental visits and eye exams.  Immunizations.  Screening for certain conditions.  Healthy lifestyle choices, such as eating a healthy diet, getting regular exercise, not using drugs or products that contain nicotine and tobacco, and limiting alcohol use. What can I expect for my preventive care visit? Physical exam Your health care provider will check your:  Height and weight. This may be used to calculate body mass index (BMI), which tells if you are at a healthy weight.  Heart rate and blood pressure.  Skin for abnormal spots. Counseling Your health care provider may ask you questions about your:  Alcohol, tobacco, and drug use.  Emotional well-being.  Home and relationship well-being.  Sexual activity.  Eating habits.  Work and work Statistician.  Method of birth control.  Menstrual cycle.  Pregnancy history. What immunizations do I need?  Influenza (flu) vaccine  This is recommended every  year. Tetanus, diphtheria, and pertussis (Tdap) vaccine  You may need a Td booster every 10 years. Varicella (chickenpox) vaccine  You may need this if you have not been vaccinated. Zoster (shingles) vaccine  You may need this after age 33. Measles, mumps, and rubella (MMR) vaccine  You may need at least one dose of MMR if you were born in 1957 or later. You may also need a second dose. Pneumococcal conjugate (PCV13) vaccine  You may need this if you have certain conditions and were not previously vaccinated. Pneumococcal polysaccharide (PPSV23) vaccine  You may need one or two doses if you smoke cigarettes or if you have certain conditions. Meningococcal conjugate (MenACWY) vaccine  You may need this if you have certain conditions. Hepatitis A vaccine  You may need this if you have certain conditions or if you travel or work in places where you may be exposed to hepatitis A. Hepatitis B vaccine  You may need this if you have certain conditions or if you travel or work in places where you may be exposed to hepatitis B. Haemophilus influenzae type b (Hib) vaccine  You may need this if you have certain conditions. Human papillomavirus (HPV) vaccine  If recommended by your health care provider, you may need three doses over 6 months. You may receive vaccines as individual doses or as more than one vaccine together in one shot (combination vaccines). Talk with your health care provider about the risks and benefits of  combination vaccines. What tests do I need? Blood tests  Lipid and cholesterol levels. These may be checked every 5 years, or more frequently if you are over 18 years old.  Hepatitis C test.  Hepatitis B test. Screening  Lung cancer screening. You may have this screening every year starting at age 46 if you have a 30-pack-year history of smoking and currently smoke or have quit within the past 15 years.  Colorectal cancer screening. All adults should have this  screening starting at age 37 and continuing until age 65. Your health care provider may recommend screening at age 51 if you are at increased risk. You will have tests every 1-10 years, depending on your results and the type of screening test.  Diabetes screening. This is done by checking your blood sugar (glucose) after you have not eaten for a while (fasting). You may have this done every 1-3 years.  Mammogram. This may be done every 1-2 years. Talk with your health care provider about when you should start having regular mammograms. This may depend on whether you have a family history of breast cancer.  BRCA-related cancer screening. This may be done if you have a family history of breast, ovarian, tubal, or peritoneal cancers.  Pelvic exam and Pap test. This may be done every 3 years starting at age 40. Starting at age 71, this may be done every 5 years if you have a Pap test in combination with an HPV test. Other tests  Sexually transmitted disease (STD) testing.  Bone density scan. This is done to screen for osteoporosis. You may have this scan if you are at high risk for osteoporosis. Follow these instructions at home: Eating and drinking  Eat a diet that includes fresh fruits and vegetables, whole grains, lean protein, and low-fat dairy.  Take vitamin and mineral supplements as recommended by your health care provider.  Do not drink alcohol if: ? Your health care provider tells you not to drink. ? You are pregnant, may be pregnant, or are planning to become pregnant.  If you drink alcohol: ? Limit how much you have to 0-1 drink a day. ? Be aware of how much alcohol is in your drink. In the [place] one drink equals one 12 oz bottle of beer (355 mL), one 5 oz glass of wine (148 mL), or one 1 oz glass of hard liquor (44 mL). Lifestyle  Take daily care of your teeth and gums.  Stay active. Exercise for at least 30 minutes on 5 or more days each week.  Do not use any products that  contain nicotine or tobacco, such as cigarettes, e-cigarettes, and chewing tobacco. If you need help quitting, ask your health care provider.  If you are sexually active, practice safe sex. Use a condom or other form of birth control (contraception) in order to prevent pregnancy and STIs (sexually transmitted infections).  If told by your health care provider, take low-dose aspirin daily starting at age 32. What's next?  Visit your health care provider once a year for a well check visit.  Ask your health care provider how often you should have your eyes and teeth checked.  Stay up to date on all vaccines. This information is not intended to replace advice given to you by your health care provider. Make sure you discuss any questions you have with your health care provider. Document Revised: 09/13/2017 Document Reviewed: 09/13/2017 Elsevier Patient Education  2020 [employer].

## 2019-06-02 ENCOUNTER — Other Ambulatory Visit: Payer: Self-pay | Admitting: Family Medicine

## 2019-07-19 IMAGING — US US EXTREM UP *R* LTD
1 series · 10 of 10 positions shown · non-contrast
Comparison: None

CLINICAL DATA: 41-year-old patient with concern of a right axillary
lump. The exam was performed by the sonographer. I was not present
for the exam, but have discussed the case by telephone with the
sonographer.

EXAM:
ULTRASOUND RIGHT UPPER EXTREMITY LIMITED
TECHNIQUE: Ultrasound examination of the upper extremity soft tissues was
performed in the area of clinical concern by the sonographer.

[Series 1: us extrem up *right* ltd · 0.06mm/px · 10 acquisitions, 10 frames shown]
[im 1/10]
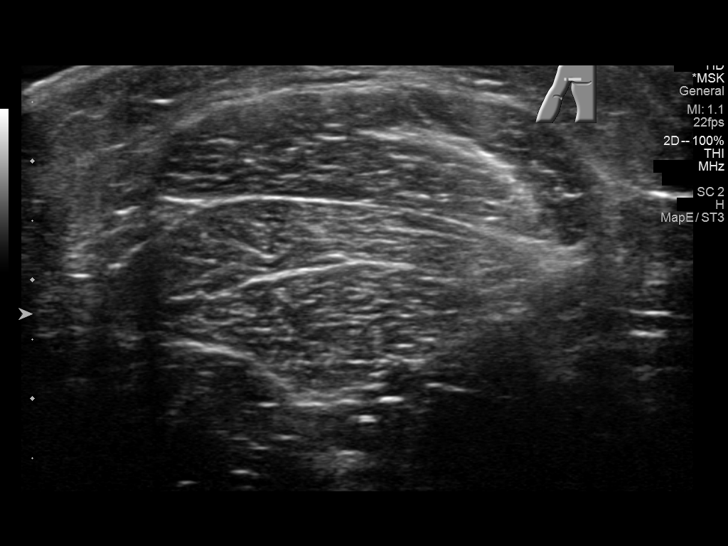
[im 2/10]
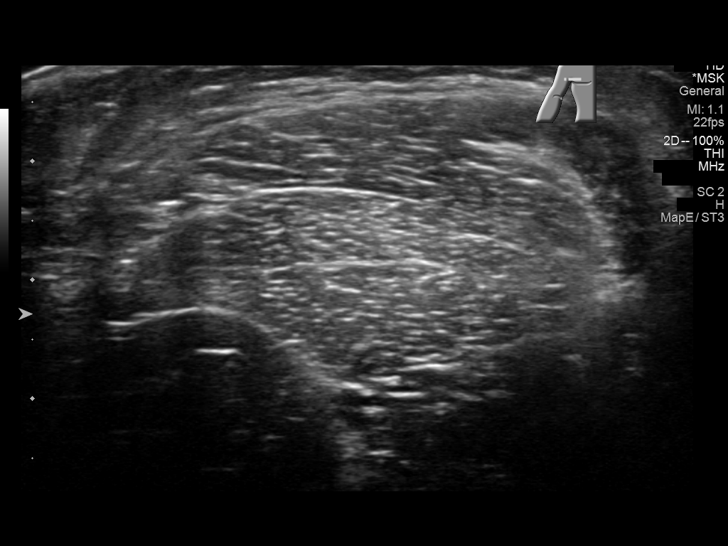
[im 3/10]
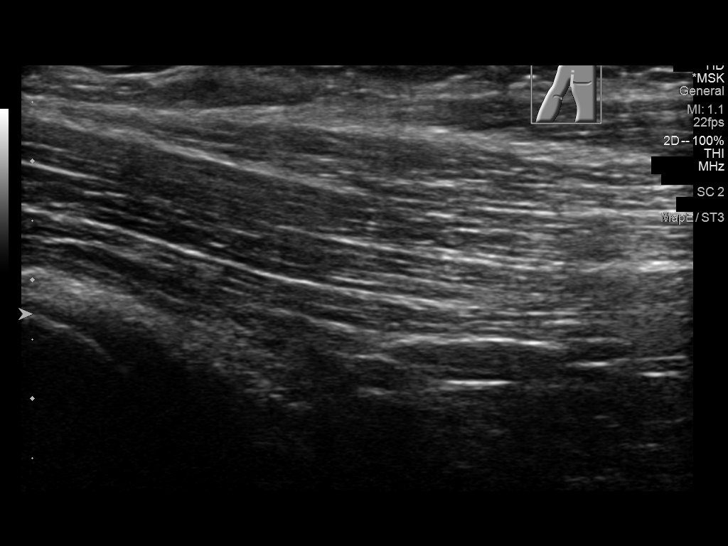
[im 4/10]
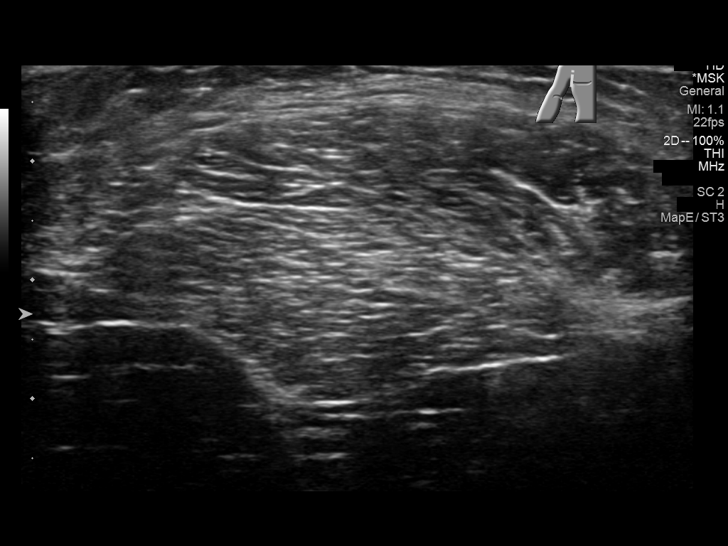
[im 5/10]
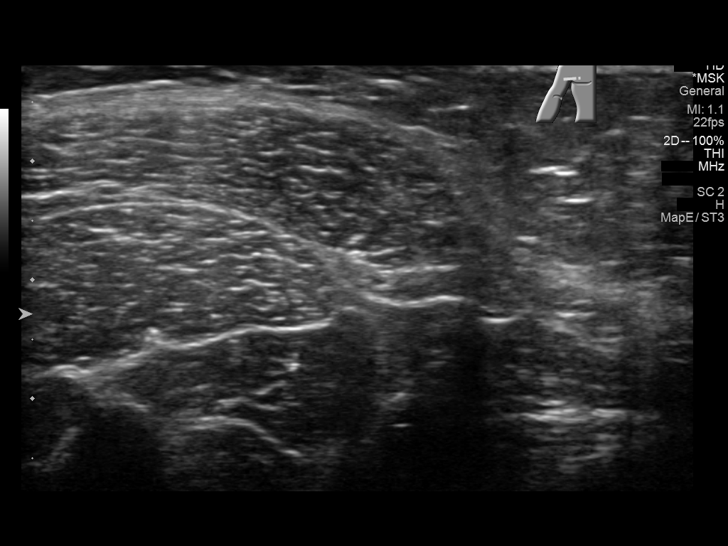
[im 6/10]
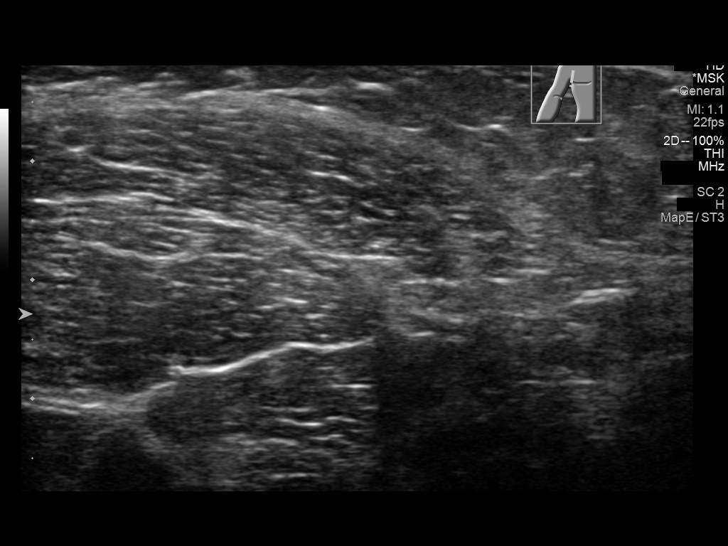
[im 7/10]
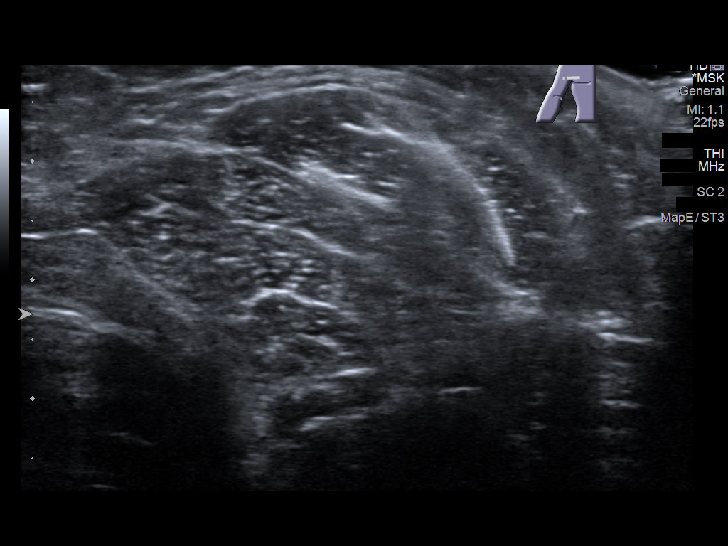
[im 8/10]
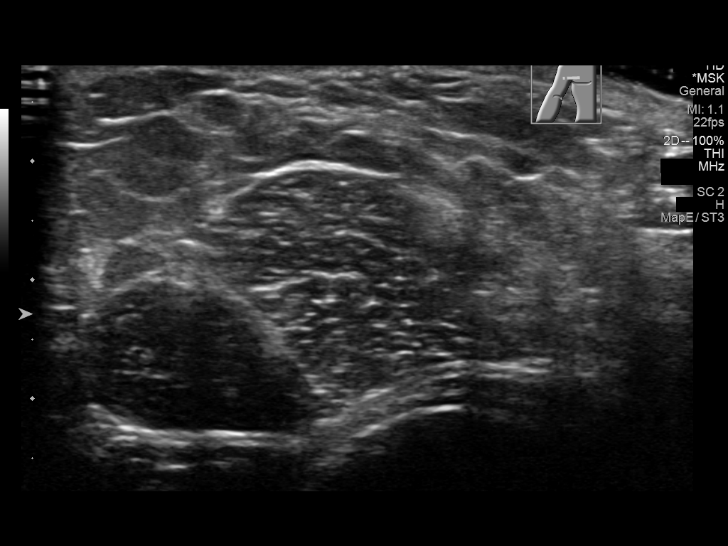
[im 9/10]
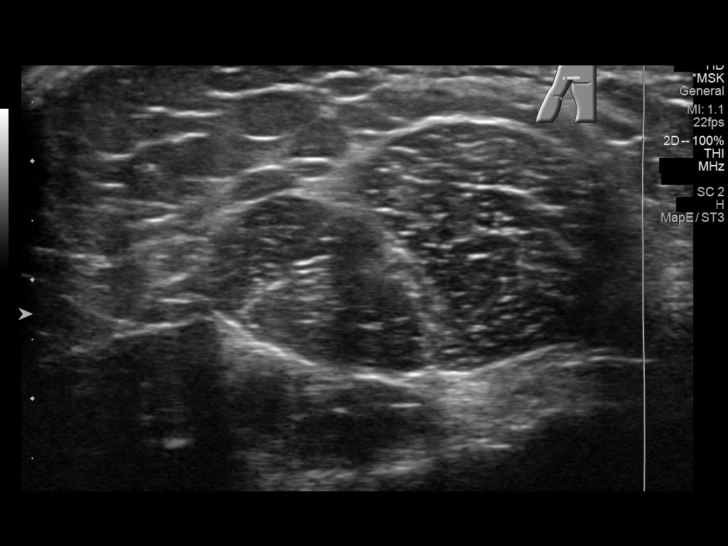
[im 10/10]
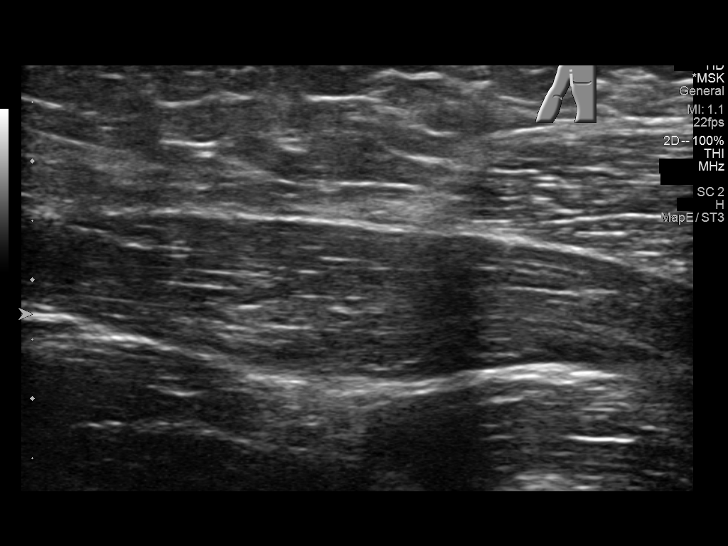

[10 of 10 positions shown; findings below may reference images not displayed]

FINDINGS: Ultrasound images performed by the sonographer show normal skin
thickness, normal bands of muscle, and the humeral head. No mass
lesion, fluid collection, edema, or lymphadenopathy is detected.
IMPRESSION: No suspicious findings on ultrasound of the region of patient
concern in the right axilla.

Given that the patient is 41 years old, annual bilateral mammogram
is recommended. If the patient has continued concern about the right
axilla, this could be performed as a diagnostic mammogram at any
time. If the patient is asymptomatic, a screening mammogram could be
performed in December 2017 (her most recent mammogram was [DATE]

## 2019-09-15 ENCOUNTER — Encounter: Payer: Self-pay | Admitting: Family Medicine

## 2019-09-15 ENCOUNTER — Other Ambulatory Visit: Payer: Self-pay

## 2019-09-15 ENCOUNTER — Ambulatory Visit (INDEPENDENT_AMBULATORY_CARE_PROVIDER_SITE_OTHER): Payer: 59 | Admitting: Family Medicine

## 2019-09-15 VITALS — BP 116/69 | HR 89 | Temp 98.3°F | Resp 97 | Ht 66.25 in | Wt 149.8 lb

## 2019-09-15 DIAGNOSIS — R194 Change in bowel habit: Secondary | ICD-10-CM

## 2019-09-15 DIAGNOSIS — R059 Cough, unspecified: Secondary | ICD-10-CM

## 2019-09-15 DIAGNOSIS — M542 Cervicalgia: Secondary | ICD-10-CM

## 2019-09-15 DIAGNOSIS — R05 Cough: Secondary | ICD-10-CM | POA: Diagnosis not present

## 2019-09-15 DIAGNOSIS — E7800 Pure hypercholesterolemia, unspecified: Secondary | ICD-10-CM | POA: Diagnosis not present

## 2019-09-15 NOTE — Assessment & Plan Note (Signed)
No red flags.  She will try increasing fiber intake to 30 g daily and trying probiotic.  If still no improvement will need referral to GI or another trial of mesalamine suppository.

## 2019-09-15 NOTE — Assessment & Plan Note (Signed)
Stable.  Continue Flexeril as needed.  Has refills on file.

## 2019-09-15 NOTE — Progress Notes (Signed)
   Jomayra Novitsky is a 44 y.o. female who presents today for an office visit.  Assessment/Plan:  Chronic Problems Addressed Today: Change in bowel habits No red flags.  She will try increasing fiber intake to 30 g daily and trying probiotic.  If still no improvement will need referral to GI or another trial of mesalamine suppository.  Cervical pain with history of scoliosis Stable.  Continue Flexeril as needed.  Has refills on file.  Preventative health care Discussed Covid vaccine.    Subjective:  HPI:  Patient here with change in bowel movements.  Started a few months ago.  Stool is "messy."  She thought was associated with food but has made several changes to her diet with no changes in bowel consistency.  No abdominal pain.  No melena or hematochezia.  No specific treatments tried.  She had something similar but not quite the same a couple of years ago and was seen by GI.  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  This cleared up her symptoms.        Objective:  Physical Exam: BP 116/69   Pulse 89   Temp 98.3 F (36.8 C) (Temporal)   Resp (!) 97   Ht 5' 6.25" (1.683 m)   Wt 149 lb 12.8 oz (67.9 kg)   LMP 09/02/2019   BMI 24.00 kg/m   Gen: No acute distress, resting comfortably CV: Regular rate and rhythm with no murmurs appreciated Pulm: Normal work of breathing, clear to auscultation bilaterally with no crackles, wheezes, or rhonchi GI: Soft, nontender, nondistended.  Neuro: Grossly normal, moves all extremities Psych: Normal affect and thought content      Burr Soffer M. Jerline Pain, MD 09/15/2019 11:55 AM

## 2019-09-15 NOTE — Patient Instructions (Signed)
It was very nice to see you today!  Please make sure that you are getting about 30 g of fiber in your diet.  Please supplement with fiber supplement as needed.  Please try taking a probiotic and let me know how your symptoms are progressing over the next 1 to 2 weeks.  We will check blood work today.  Take care, Dr Jerline Pain  Please try these tips to maintain a healthy lifestyle:   Eat at least 3 REAL meals and 1-2 snacks per day.  Aim for no more than 5 hours between eating.  If you eat breakfast, please do so within one hour of getting up.    Each meal should contain half fruits/vegetables, one quarter protein, and one quarter carbs (no bigger than a computer mouse)   Cut down on sweet beverages. This includes juice, soda, and sweet tea.     Drink at least 1 glass of water with each meal and aim for at least 8 glasses per day   Exercise at least 150 minutes every week.

## 2019-09-16 LAB — LIPID PANEL
Cholesterol: 193 mg/dL (ref <200)
HDL: 69 mg/dL (ref >=50)
LDL Cholesterol (Calc): 106 mg/dL (calc) — ABNORMAL HIGH
Non-HDL Cholesterol (Calc): 124 mg/dL (calc) (ref <130)
Total CHOL/HDL Ratio: 2.8 (calc) (ref <5.0)
Triglycerides: 87 mg/dL (ref <150)

## 2019-09-16 LAB — COMPREHENSIVE METABOLIC PANEL
AG Ratio: 1.4 (calc) (ref 1.0–2.5)
ALT: 18 U/L (ref 6–29)
AST: 19 U/L (ref 10–30)
Albumin: 4.3 g/dL (ref 3.6–5.1)
Alkaline phosphatase (APISO): 45 U/L (ref 31–125)
BUN: 11 mg/dL (ref 7–25)
CO2: 25 mmol/L (ref 20–32)
Calcium: 9.4 mg/dL (ref 8.6–10.2)
Chloride: 102 mmol/L (ref 98–110)
Creat: 0.83 mg/dL (ref 0.50–1.10)
Globulin: 3 g/dL (calc) (ref 1.9–3.7)
Glucose, Bld: 85 mg/dL (ref 65–99)
Potassium: 4 mmol/L (ref 3.5–5.3)
Sodium: 136 mmol/L (ref 135–146)
Total Bilirubin: 0.5 mg/dL (ref 0.2–1.2)
Total Protein: 7.3 g/dL (ref 6.1–8.1)

## 2019-09-16 LAB — CBC
HCT: 39.5 % (ref 35.0–45.0)
Hemoglobin: 13.3 g/dL (ref 11.7–15.5)
MCH: 30.4 pg (ref 27.0–33.0)
MCHC: 33.7 g/dL (ref 32.0–36.0)
MCV: 90.4 fL (ref 80.0–100.0)
MPV: 9.6 fL (ref 7.5–12.5)
Platelets: 294 10*3/uL (ref 140–400)
RBC: 4.37 10*6/uL (ref 3.80–5.10)
RDW: 12 % (ref 11.0–15.0)
WBC: 7 10*3/uL (ref 3.8–10.8)

## 2019-09-16 LAB — TSH: TSH: 1.09 mIU/L

## 2019-09-16 LAB — SARS-COV-2 ANTIBODY(IGG)SPIKE,SEMI-QUANTITATIVE: SARS COV1 AB(IGG)SPIKE,SEMI QN: <1 index (ref <1.00)

## 2019-09-17 ENCOUNTER — Telehealth: Payer: Self-pay | Admitting: *Deleted

## 2019-09-17 NOTE — Telephone Encounter (Signed)
Pt reported had [employer] vaccine and had body aches and felt like a bus run over her. Will like to know why the symptoms with this vaccine? Had flu and other vaccine in the past and no symptoms like this  Please advise

## 2019-09-17 NOTE — Progress Notes (Signed)
Please inform patient of the following:  Her covid antibody is negative - she is still susceptible to infection. All of her other labs are STABLE.  Algis Greenhouse. Jerline Pain, MD 09/17/2019 9:27 AM

## 2019-09-19 NOTE — Telephone Encounter (Signed)
I am glad that she got vaccinated. Those are normal and expected side effects to the vaccine they should last no longer than a day or two. It is ok for her to take tylenol or ibuprofen if needed.  Algis Greenhouse. Jerline Pain, MD 09/19/2019 2:17 PM

## 2019-11-02 ENCOUNTER — Other Ambulatory Visit: Payer: Self-pay | Admitting: Family Medicine

## 2019-11-02 IMAGING — MG DIGITAL DIAGNOSTIC UNILATERAL LEFT MAMMOGRAM WITH TOMO AND CAD
4 series · 4 of 12 positions shown · non-contrast
Comparison: Previous exam(s).

CLINICAL DATA: 42-year-old female recalled from screening mammogram
dated 01/07/2018 for a possible left breast asymmetry.

EXAM:
DIGITAL DIAGNOSTIC LEFT MAMMOGRAM WITH CAD AND TOMO
ULTRASOUND LEFT BREAST

[L CC synth-2D]
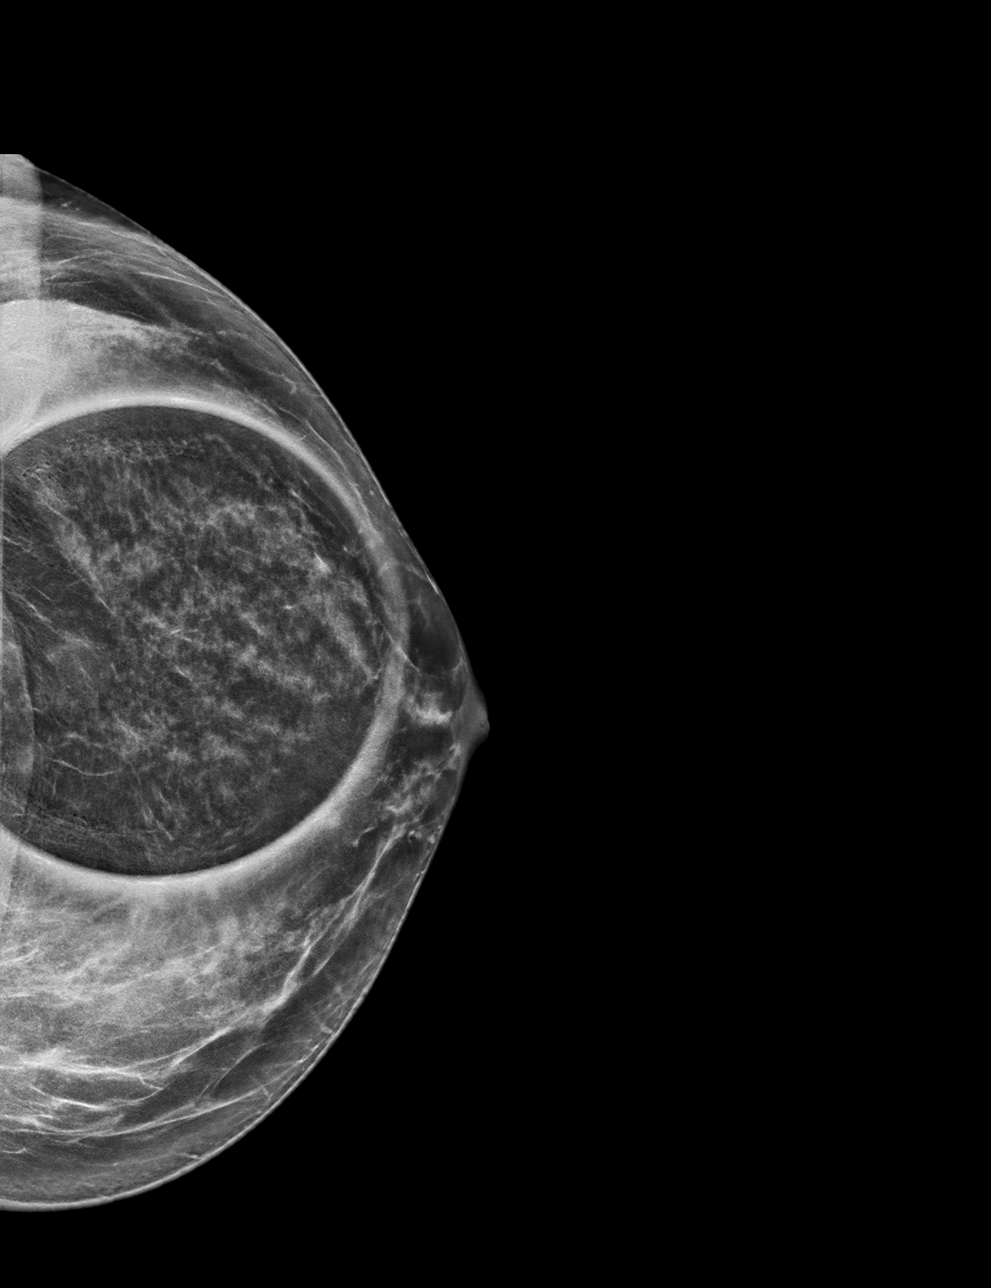

[L MLO synth-2D]
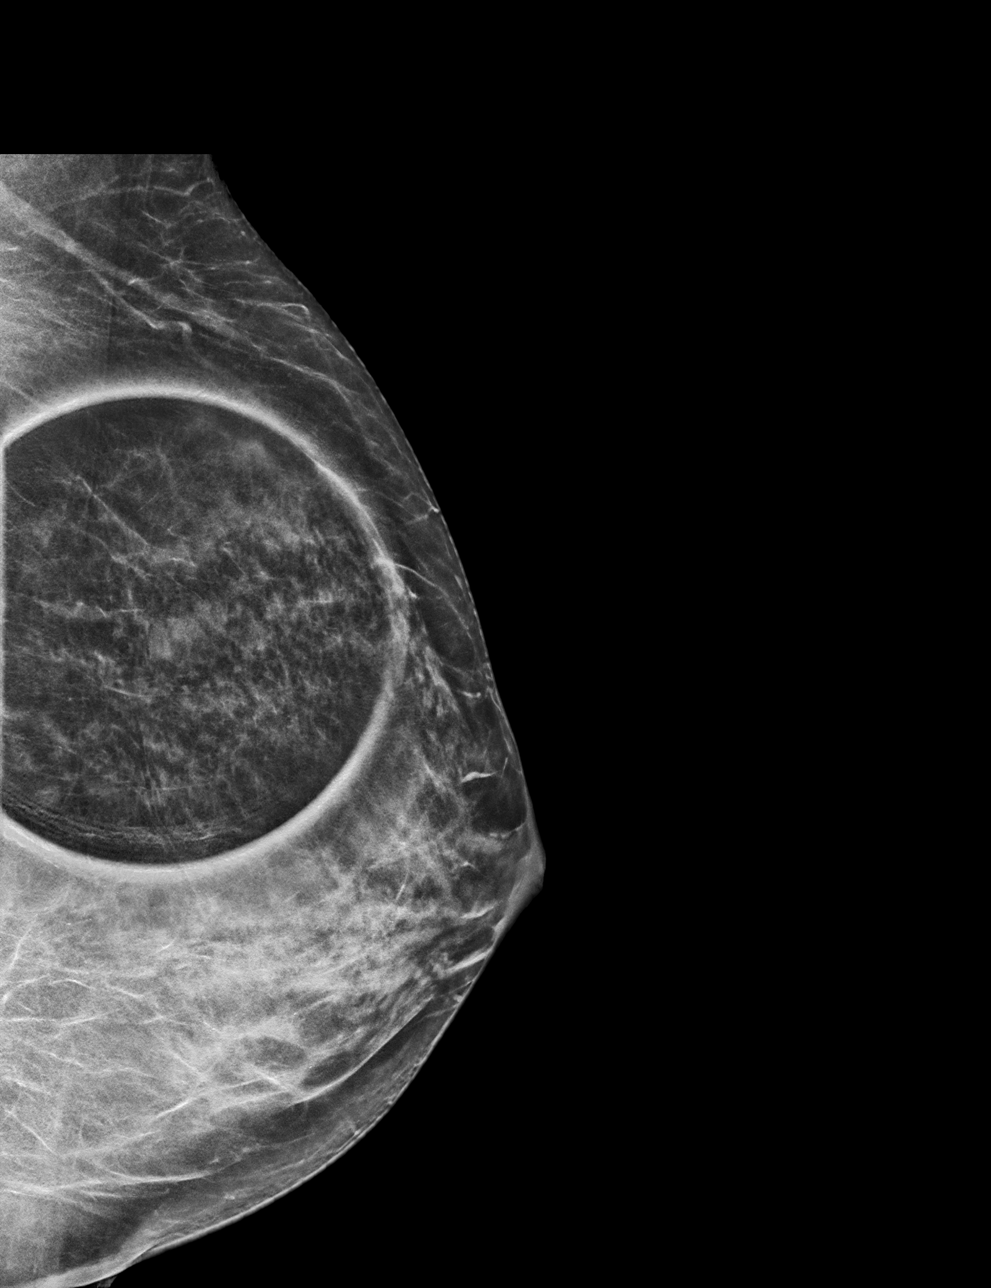

[L MLO tomo · tomo slice 25/50.0]
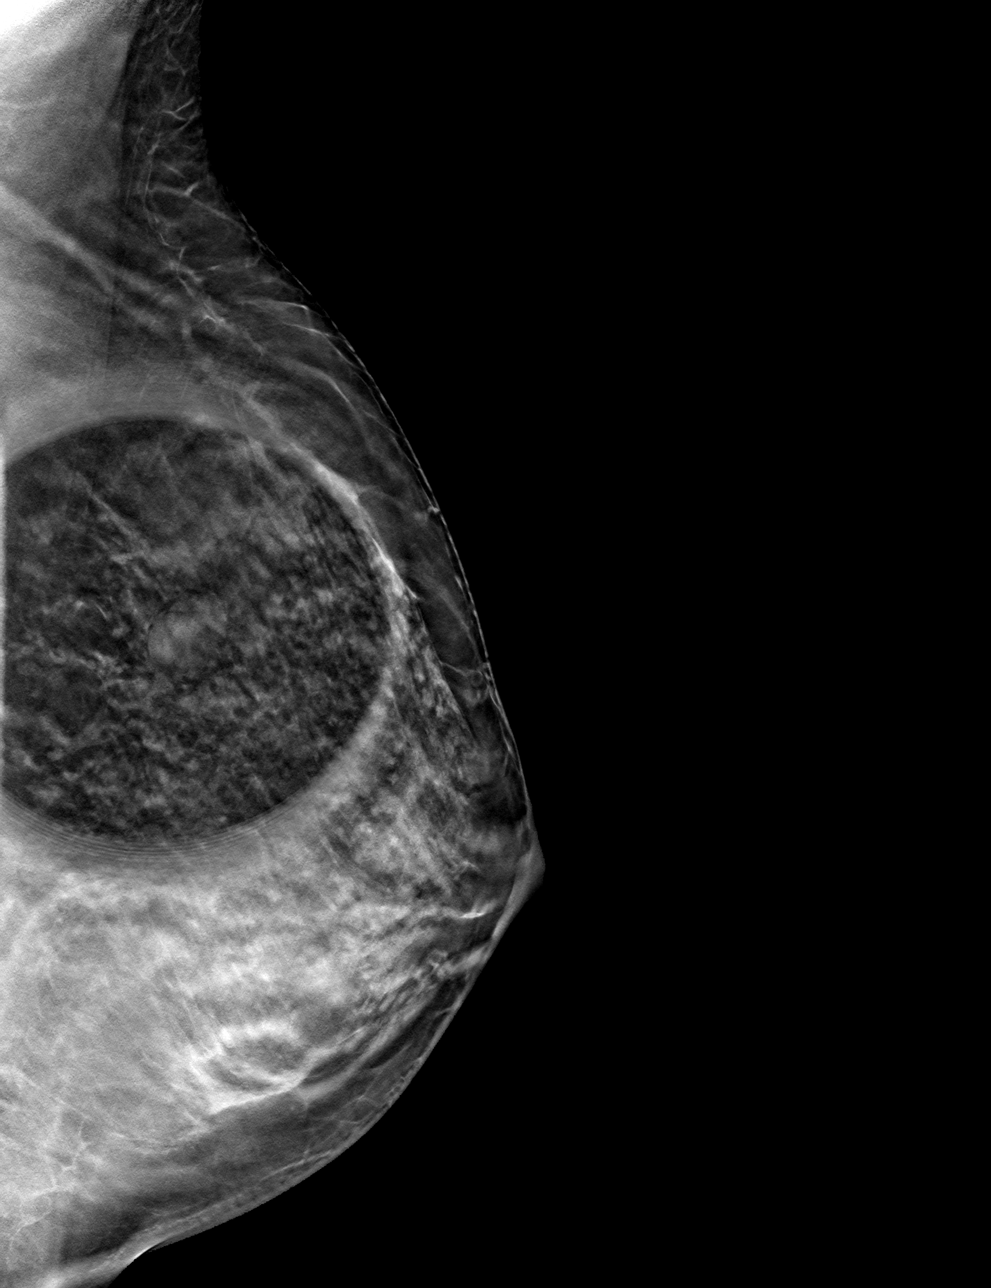

[L CC tomo · tomo slice 28/55.0]
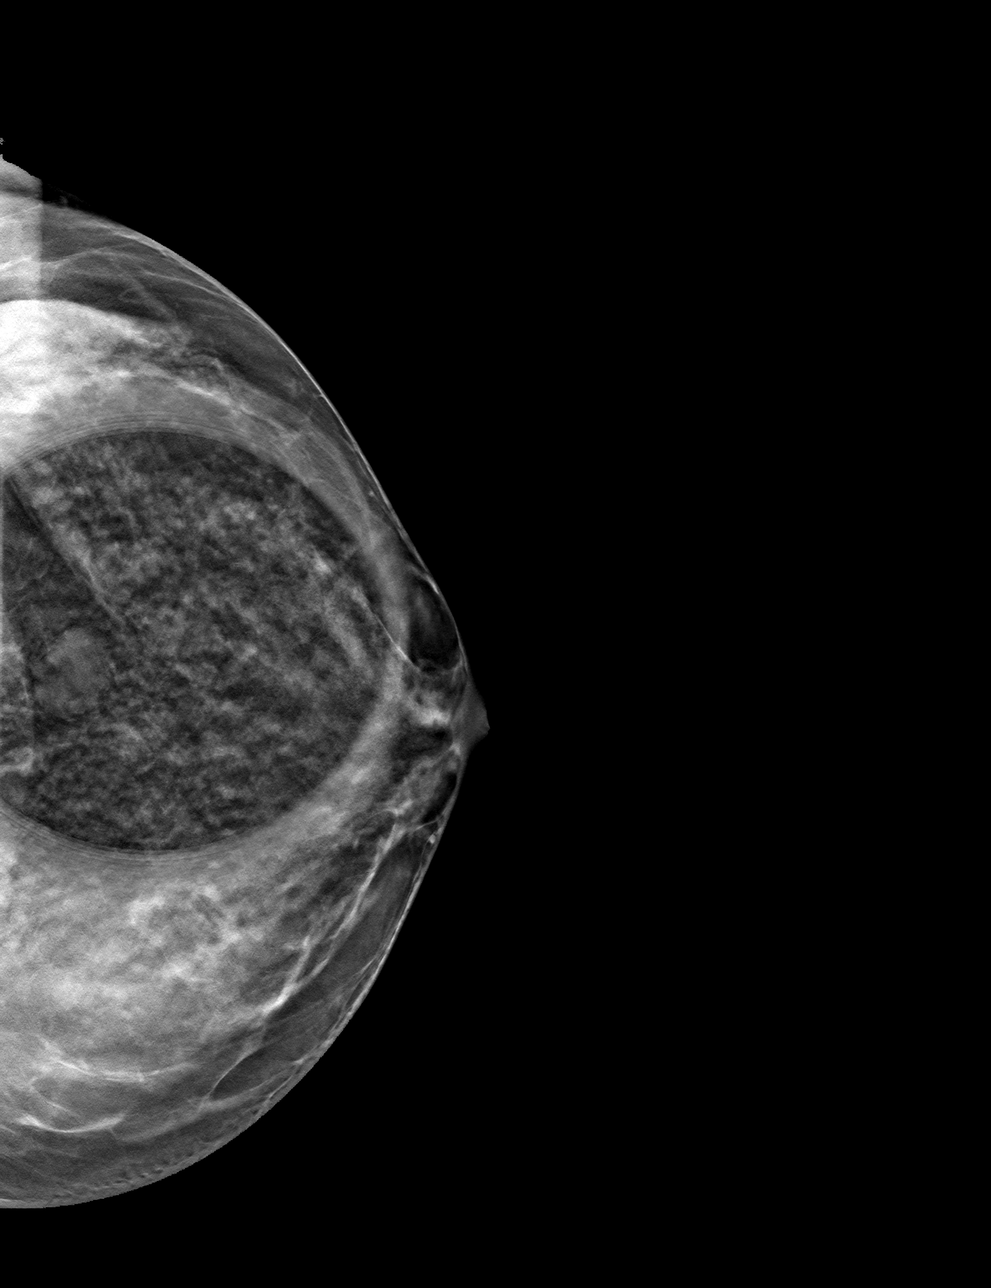

[4 of 12 positions shown; findings below may reference images not displayed]

ACR Breast Density Category c: The breast tissue is heterogeneously
dense, which may obscure small masses.
FINDINGS: There is a persistent oval, circumscribed equal density mass in the
superior central left breast at posterior depth. Further evaluation
with ultrasound was performed.

Mammographic images were processed with CAD.

Targeted ultrasound is performed, showing an oval, circumscribed
anechoic mass in the deep 12 o'clock position 3 cm from the nipple.
It measures 1.5 x 1.2 x 0.6 cm. There is no seated vascularity. This
correlates with the mammographic finding is consistent with a benign
simple cyst.
IMPRESSION: Benign simple cyst corresponding with the screening mammographic
findings. No further imaging follow-up required.

RECOMMENDATION:
Screening mammogram in one year.(Code:55-5-OX4)

I have discussed the findings and recommendations with the patient.
Results were also provided in writing at the conclusion of the
visit. If applicable, a reminder letter will be sent to the patient
regarding the next appointment.

BI-RADS CATEGORY  2: Benign.

## 2019-11-07 ENCOUNTER — Telehealth: Payer: Self-pay

## 2019-11-07 NOTE — Progress Notes (Signed)
GYNECOLOGY  VISIT  CC:   Break through bleeding  HPI: 44 y.o. G11P2012 Married White or Caucasian female here for BTB on ocps.  She thinks a large or strenuous bowel movement seems to cause the bleeding.  She is sure this is vaginal bleeding and not rectal bleeding.  Usually, she will see this only on toilet paper when she wipes.  This bleeding has always been mid cycle but now she is having bleeding with regular bowel movement and then this past month, she had irregular bleeding at the beginning of her cycle.  Alison Fleming has experienced irregular bleeding/mid cycle bleeding for years but with change in bleeding is experiencing increased worry about something being wrong.  She did have a PUS 02/20/2019 and 9/19.  Endometrium did not appear abnormal with either evaluation.  Last pap smear 03/31/2019 with neg findings and neg HR HPV.  Alison Fleming is on microgestin 1.5/30.  Does not have pelvic pain.  Does not have bleeding with intercourse.  Alison Fleming reports some urinary urgency.  Has h/o UTIs.  Denies dysuria but UTIs often start this way.  No back pain.  No fever.  poct upt-neg  GYNECOLOGIC HISTORY: No LMP recorded. Contraception: ocp Menopausal hormone therapy: none  Patient Active Problem List   Diagnosis Date Noted  . Dysfunctional uterine bleeding, with normal [place] 11/16/2017  . Change in bowel habits 11/16/2017  . Palpitation 04/30/2017  . Paresthesias/numbness 04/24/2017  . Polyarthralgia 03/28/2017  . Dyspepsia, s/p normal EGD 03/08/17, with recommendation for lifetime anti-reflux regimen 03/26/2017  . Cervical pain with history of scoliosis 03/26/2017  . Malaise and fatigue 03/26/2017  . Tension headache 03/26/2017    Past Medical History:  Diagnosis Date  . 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 Anxiety   . Atypical chest pain 04/24/2017  . Dyspepsia, s/p normal EGD 03/08/17, with recommendation for lifetime anti-reflux regimen 03/26/2017  . GAD (generalized anxiety disorder) 05/10/2016  . History of spinal  fusion for scoliosis 03/26/2017  . Left upper quadrant pain 03/26/2017  . Malaise and fatigue 03/26/2017  . Paresthesias/numbness 04/24/2017  . Polyarthralgia 03/28/2017  . Positive Lyme disease serology   . Seasonal allergies   . Tension headache 03/26/2017    Past Surgical History:  Procedure Laterality Date  . DILATATION & CURRETTAGE/HYSTEROSCOPY WITH RESECTOCOPE  02/12/2012   Procedure: [place];  Surgeon: Azalia Bilis, MD;  Location: [facility];  Service: Gynecology;  Laterality: N/A;  . DILATION AND CURETTAGE OF UTERUS  2010  . DILATION AND CURETTAGE OF UTERUS  2012  . SPINAL FUSION  2008    MEDS:   Current Outpatient Medications on File Prior to Visit  Medication Sig Dispense Refill  . CRANBERRY PO Take by mouth.     . cyanocobalamin 100 MCG tablet Take 100 mcg by mouth every other day.     . cyclobenzaprine (FLEXERIL) 10 MG tablet Take 1 tablet (10 mg total) by mouth at bedtime. 90 tablet 2  . Norethindrone Acetate-Ethinyl Estradiol (MICROGESTIN) 1.5-30 MG-MCG tablet Take 1 tablet by mouth daily. 3 Package 4  . omeprazole (PRILOSEC) 40 MG capsule TAKE 1 CAPSULE BY MOUTH EVERY DAY 30 capsule 2  . Prenatal Vit-Fe Fumarate-FA (MULTIVITAMIN-PRENATAL) 27-0.8 MG TABS Take 1 tablet by mouth daily.    . valACYclovir (VALTREX) 1000 MG tablet TAKE 1 TABLET (1,000 MG TOTAL) BY MOUTH 2 (TWO) TIMES DAILY AS NEEDED. AS NEEDED 20 tablet 3  . VITAMIN D PO Take by mouth.     No current  facility-administered medications on file prior to visit.    ALLERGIES: Patient has no known allergies.  Family History  Problem Relation Age of Onset  . Diabetes Maternal Grandmother   . Hypertension Maternal Grandmother   . Heart disease Maternal Grandmother   . Heart attack Maternal Grandmother   . Osteoporosis Maternal Grandmother   . Diabetes Maternal Grandfather   . Hypertension Maternal Grandfather   . Heart disease Maternal Grandfather   . Heart attack  Maternal Grandfather   . Hypertension Mother   . Anxiety disorder Mother   . Diabetes Mother   . Heart murmur Brother   . Colon cancer Neg Hx   . Esophageal cancer Neg Hx   . Rectal cancer Neg Hx   . Stomach cancer Neg Hx   . Breast cancer Neg Hx     SH:  Married, non smoker  Review of Systems  Genitourinary: Positive for menstrual problem (irregular bleeding).    PHYSICAL EXAMINATION:    There were no vitals taken for this visit.    General appearance: alert, cooperative and appears stated age Abdomen: soft, non-tender; bowel sounds normal; no masses,  no organomegaly Lymph:  no inguinal LAD noted  Pelvic: External genitalia:  no lesions              Urethra:  normal appearing urethra with no masses, tenderness or lesions              Bartholins and Skenes: normal                 Vagina: normal appearing vagina with normal color and discharge, no lesions              Cervix: no lesions , no friability noted             Bimanual Exam:  Uterus:  normal size, contour, position, consistency, mobility, non-tender              Adnexa: no mass, fullness, tenderness   Endometrial biopsy recommended.  Discussed with patient.  Verbal and written consent obtained.   Procedure:  Speculum placed.  Cervix visualized and cleansed with betadine prep.  A single toothed tenaculum was applied to the anterior lip of the cervix.  Endometrial pipelle was advanced through the cervix into the endometrial cavity without difficulty.  Pipelle passed to 7cm.  Suction applied and pipelle removed with good tissue sample obtained.  Tenculum removed.  No bleeding noted.  Patient tolerated procedure well.  Chaperone, Royal Hawthorn, CMA, was present for exam.  Assessment: Mid cycle bleeding with increased changes in irregular bleeding this Alison Fleming month.  (normal appearing endometrium on PUS earlier this year and 09/2017) On microgestin 1.5/30 Urinary urgency  Plan: Endometrial biopsy obtained today Consider [facility]  to evaluate further for endometrial polyp (Alison Fleming not sure wants/needs to do this right now) Consider change in OCP.  We discussed Loloestrin today or POP Urine culture pending Rx for bactrim DS BID x 3 days to pharmacy.   35 minutes of total time was spent for this patient encounter, including preparation, face-to-face counseling with the patient and coordination of care, and documentation of the encounter.

## 2019-11-07 NOTE — Telephone Encounter (Signed)
Patient is calling in regards to having lower pelvic discomfort and break through bleeding.

## 2019-11-07 NOTE — Telephone Encounter (Signed)
AEX 03/31/19 with DL PUS/OV- BS d/t  BTB, irreg bleeding, Neg PUS  Spoke with pt. Pt states having  BTB/spotting after large BMs. Pt states is concerned because it keeps happening and worried it might be something going on with uterus. Pt also states having dull ache in lower pelvis x 1 week. Pt had PUS in Feb 2021 with Dr Quincy Simmonds that was negative. Pt states has monthly normal short cycles, LMP 10/13 x 2 days and only changing a tampon twice a day. States the BTB occurred after large BM on 11/03/19 x 1 with only seeing pink light spotting on toilet paper, not using any pad or panty liner.  Pt taking Microgestin Rx every day at same time and has not skipped or missed any pills. Denies any heavy bleeding, clots, UTI sx or any severe abd pain.   Pt advised to have follow up OV for evaluation. Pt agreeable. Pt states would like to see provider who is first available. Pt scheduled with Dr Sabra Heck on 10/26 at 1130 am. Pt agreeable to date and time of appt. Offered earlier appts, declines due to personal schedule. Pt given ER and UC precautions over the weekend. Pt verbalized understanding.   Routing to Dr Sabra Heck for review.  Encounter closed.

## 2019-11-11 ENCOUNTER — Ambulatory Visit (INDEPENDENT_AMBULATORY_CARE_PROVIDER_SITE_OTHER): Payer: 59 | Admitting: Obstetrics & Gynecology

## 2019-11-11 ENCOUNTER — Other Ambulatory Visit (HOSPITAL_COMMUNITY)
Admission: RE | Admit: 2019-11-11 | Discharge: 2019-11-11 | Disposition: A | Discharge status: Home / Self Care | Payer: 59 | Source: Ambulatory Visit | Attending: Obstetrics & Gynecology | Admitting: Obstetrics & Gynecology

## 2019-11-11 ENCOUNTER — Other Ambulatory Visit: Payer: Self-pay

## 2019-11-11 ENCOUNTER — Encounter: Payer: Self-pay | Admitting: Obstetrics & Gynecology

## 2019-11-11 VITALS — BP 118/72 | HR 70 | Resp 16 | Wt 149.0 lb

## 2019-11-11 DIAGNOSIS — N926 Irregular menstruation, unspecified: Secondary | ICD-10-CM | POA: Insufficient documentation

## 2019-11-11 DIAGNOSIS — Z87440 Personal history of urinary (tract) infections: Secondary | ICD-10-CM

## 2019-11-11 DIAGNOSIS — R3915 Urgency of urination: Secondary | ICD-10-CM

## 2019-11-11 LAB — POCT URINALYSIS DIPSTICK
Bilirubin, UA: NEGATIVE
Blood, UA: NEGATIVE
Glucose, UA: NEGATIVE
Ketones, UA: NEGATIVE
Nitrite, UA: NEGATIVE
Protein, UA: NEGATIVE
Urobilinogen, UA: NEGATIVE E.U./dL — AB
pH, UA: 5 (ref 5.0–8.0)

## 2019-11-11 LAB — POCT URINE PREGNANCY: Preg Test, Ur: NEGATIVE

## 2019-11-11 MED ORDER — SULFAMETHOXAZOLE-TRIMETHOPRIM 800-160 MG PO TABS: 1.0000 | ORAL_TABLET | Freq: Two times a day (BID) | ORAL | 0 refills | Status: DC

## 2019-11-13 ENCOUNTER — Ambulatory Visit: Payer: Self-pay | Admitting: Obstetrics & Gynecology

## 2019-11-13 LAB — URINE CULTURE: Organism ID, Bacteria: NO GROWTH

## 2019-11-13 LAB — SURGICAL PATHOLOGY

## 2019-11-18 ENCOUNTER — Other Ambulatory Visit: Payer: Self-pay | Admitting: *Deleted

## 2019-11-18 DIAGNOSIS — N840 Polyp of corpus uteri: Secondary | ICD-10-CM

## 2019-11-18 DIAGNOSIS — N926 Irregular menstruation, unspecified: Secondary | ICD-10-CM

## 2019-11-19 ENCOUNTER — Encounter: Payer: Self-pay | Admitting: Physician Assistant

## 2019-11-19 ENCOUNTER — Ambulatory Visit (INDEPENDENT_AMBULATORY_CARE_PROVIDER_SITE_OTHER): Payer: 59 | Admitting: Physician Assistant

## 2019-11-19 VITALS — BP 100/60 | HR 85 | Ht 66.25 in | Wt 149.0 lb

## 2019-11-19 DIAGNOSIS — R194 Change in bowel habit: Secondary | ICD-10-CM

## 2019-11-19 DIAGNOSIS — Z8719 Personal history of other diseases of the digestive system: Secondary | ICD-10-CM

## 2019-11-19 MED ORDER — MESALAMINE 1000 MG RE SUPP: 1000.0000 mg | Freq: Every day | RECTAL | 0 refills | Status: DC

## 2019-11-19 NOTE — Patient Instructions (Signed)
Normal BMI (Body Mass Index- based on height and weight) is between 19 and 25. Your BMI today is Body mass index is 23.87 kg/m. [name] Please consider follow up  regarding your BMI with your Primary Care Provider.  We have sent the following medications to your pharmacy for you to pick up at your convenience: canasa suppositories   Please follow up with Anderson Malta in 4 weeks.    I appreciate the opportunity to care for you. Ellouise Newer, PA-C

## 2019-11-19 NOTE — Progress Notes (Signed)
Reviewed and agree with documentation and assessment and plan. K. Veena Oneda Duffett , MD   

## 2019-11-19 NOTE — Progress Notes (Signed)
Chief Complaint: Change in bowel habits  HPI:    Alison Fleming is a 44 year old Caucasian female with a past medical history as listed below including anxiety and multiple others, assigned to Dr. Silverio Decamp, who returns to clinic today for a complaint of change in bowel habits.    11/28/2017 patient had a colonoscopy with Dr. Silverio Decamp with a single erosion in the terminal ileum, patchy mild inflammation in the rectum secondary to proctitis in the entire examined colon was otherwise normal, nonbleeding internal hemorrhoids.  Patient was prescribed Canasa suppositories 1000 mg 1 per rectum nightly for 2 weeks, repeat colon recommended in 10 years.    12/21/2017 patient presents to clinic age 82 she is doing much better no longer had messy stools and back to her regular regular schedule bowel movements once every day, at least 95% better after using suppositories as above.  It was discussed that colonoscopy showed nonspecific proctitis which improved after using Canasa suppositories nightly for 2 weeks was discussed that she could repeat the Canasa suppositories she had recurrence of symptoms at any point in the future.  Patient was also given hydrocortisone ointment for hemorrhoids.    Today, the patient presents to clinic and tells me that back about 5 months ago now she noticed another change in her bowel habits towards slightly looser and "messy", they are also incomplete and "smell bad", similar to previously, but she tells me they are "not as sticky" and there is no rectal pain.  She has added fiber Gummies which seemed to help may be slightly, but have not resolved the issue.  Denies any changes in medication or diet.    Denies fever, chills, blood in her stool, weight loss or symptoms that awaken her from sleep.  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   Anxiety    Atypical chest pain 04/24/2017   Dyspepsia, s/p normal EGD 03/08/17, with recommendation for lifetime  anti-reflux regimen 03/26/2017   GAD (generalized anxiety disorder) 05/10/2016   History of spinal fusion for scoliosis 03/26/2017   Left upper quadrant pain 03/26/2017   Malaise and fatigue 03/26/2017   Paresthesias/numbness 04/24/2017   Polyarthralgia 03/28/2017   Positive Lyme disease serology    Seasonal allergies    Tension headache 03/26/2017    Past Surgical History:  Procedure Laterality Date   DILATATION & CURRETTAGE/HYSTEROSCOPY WITH RESECTOCOPE  02/12/2012   Procedure: [place];  Surgeon: Azalia Bilis, MD;  Location: [facility];  Service: Gynecology;  Laterality: N/A;   DILATION AND CURETTAGE OF UTERUS  2010   DILATION AND CURETTAGE OF UTERUS  2012   SPINAL FUSION  2008    Current Outpatient Medications  Medication Sig Dispense Refill   CRANBERRY PO Take by mouth.  (Patient not taking: Reported on 11/11/2019)     cyanocobalamin 100 MCG tablet Take 100 mcg by mouth every other day.      cyclobenzaprine (FLEXERIL) 10 MG tablet Take 1 tablet (10 mg total) by mouth at bedtime. (Patient not taking: Reported on 11/11/2019) 90 tablet 2   Norethindrone Acetate-Ethinyl Estradiol (MICROGESTIN) 1.5-30 MG-MCG tablet Take 1 tablet by mouth daily. 3 Package 4   omeprazole (PRILOSEC) 40 MG capsule TAKE 1 CAPSULE BY MOUTH EVERY DAY 30 capsule 2   Prenatal Vit-Fe Fumarate-FA (MULTIVITAMIN-PRENATAL) 27-0.8 MG TABS Take 1 tablet by mouth daily.     sulfamethoxazole-trimethoprim (BACTRIM DS) 800-160 MG tablet Take 1 tablet by mouth 2 (two) times daily. 6 tablet  0   valACYclovir (VALTREX) 1000 MG tablet TAKE 1 TABLET (1,000 MG TOTAL) BY MOUTH 2 (TWO) TIMES DAILY AS NEEDED. AS NEEDED 20 tablet 3   VITAMIN D PO Take by mouth.     No current facility-administered medications for this visit.    Allergies as of 11/19/2019   (No Known Allergies)    Family History  Problem Relation Age of Onset   Diabetes Maternal Grandmother     Hypertension Maternal Grandmother    Heart disease Maternal Grandmother    Heart attack Maternal Grandmother    Osteoporosis Maternal Grandmother    Diabetes Maternal Grandfather    Hypertension Maternal Grandfather    Heart disease Maternal Grandfather    Heart attack Maternal Grandfather    Hypertension Mother    Anxiety disorder Mother    Diabetes Mother    Heart murmur Brother    Colon cancer Neg Hx    Esophageal cancer Neg Hx    Rectal cancer Neg Hx    Stomach cancer Neg Hx    Breast cancer Neg Hx     Social History   Socioeconomic History   Marital status: Married    Spouse name: Not on file   Number of children: 2   Years of education: Not on file   Highest education level: Not on file  Occupational History   Occupation: HUMAN RESOURCES    Employer: [employer]  Tobacco Use   Smoking status: Never Smoker   Smokeless tobacco: Never Used  Scientific laboratory technician Use: Never used  Substance and Sexual Activity   Alcohol use: Yes    Alcohol/week: 1.0 - 2.0 standard drink    Types: 1 - 2 Standard drinks or equivalent per week   Drug use: No   Sexual activity: Yes    Partners: Male    Birth control/protection: OCP  Other Topics Concern   Not on file  Social History Narrative   Not on file   Social Determinants of Health   Financial Resource Strain:    Difficulty of Paying Living Expenses: Not on file  Food Insecurity:    Worried About Charity fundraiser in the Last Year: Not on file   [employer] of Food in the Last Year: Not on file  Transportation Needs:    Lack of Transportation (Medical): Not on file   Lack of Transportation (Non-Medical): Not on file  Physical Activity:    Days of Exercise per Week: Not on file   Minutes of Exercise per Session: Not on file  Stress:    Feeling of Stress : Not on file  Social Connections:    Frequency of Communication with Friends and Family: Not on file   Frequency  of Social Gatherings with Friends and Family: Not on file   Attends Religious Services: Not on file   Active Member of Clubs or Organizations: Not on file   Attends Archivist Meetings: Not on file   Marital Status: Not on file  Intimate Partner Violence:    Fear of Current or Ex-Partner: Not on file   Emotionally Abused: Not on file   Physically Abused: Not on file   Sexually Abused: Not on file    Review of Systems:    Constitutional: No weight loss, fever or chills Skin: No rash  Cardiovascular: No chest pain Respiratory: No SOB Gastrointestinal: See HPI and otherwise negative Genitourinary: No dysuria  Neurological: No headache, dizziness or syncope Musculoskeletal: No new  muscle or joint pain Hematologic: No bleeding  Psychiatric: No history of depression or anxiety   Physical Exam:  Vital signs: BP 100/60    Pulse 85    Ht 5' 6.25" (1.683 m)    Wt 149 lb (67.6 kg)    LMP 10/29/2019 Comment: btb 11-03-2019   BMI 23.87 kg/m   Constitutional:   Pleasant Caucasian female appears to be in NAD, Well developed, Well nourished, alert and cooperative Respiratory: Respirations even and unlabored. Lungs clear to auscultation bilaterally.   No wheezes, crackles, or rhonchi.  Cardiovascular: Normal S1, S2. No MRG. Regular rate and rhythm. No peripheral edema, cyanosis or pallor.  Gastrointestinal:  Soft, nondistended, nontender. No rebound or guarding. Normal bowel sounds. No appreciable masses or hepatomegaly. Rectal:  Not performed.  Psychiatric: Demonstrates good judgement and reason without abnormal affect or behaviors.  MOST RECENT LABS AND IMAGING: CBC    Component Value Date/Time   WBC 7.0 09/15/2019 1149   RBC 4.37 09/15/2019 1149   HGB 13.3 09/15/2019 1149   HGB 12.7 12/26/2012 1129   HCT 39.5 09/15/2019 1149   PLT 294 09/15/2019 1149   MCV 90.4 09/15/2019 1149   MCH 30.4 09/15/2019 1149   MCHC 33.7 09/15/2019 1149   RDW 12.0 09/15/2019 1149    LYMPHSABS 1.5 09/27/2018 0900   MONOABS 0.5 09/27/2018 0900   EOSABS 0.2 09/27/2018 0900   BASOSABS 0.1 09/27/2018 0900    CMP     Component Value Date/Time   NA 136 09/15/2019 1149   K 4.0 09/15/2019 1149   CL 102 09/15/2019 1149   CO2 25 09/15/2019 1149   GLUCOSE 85 09/15/2019 1149   BUN 11 09/15/2019 1149   CREATININE 0.83 09/15/2019 1149   CALCIUM 9.4 09/15/2019 1149   PROT 7.3 09/15/2019 1149   ALBUMIN 4.5 03/11/2019 1450   AST 19 09/15/2019 1149   ALT 18 09/15/2019 1149   ALKPHOS 39 03/11/2019 1450   BILITOT 0.5 09/15/2019 1149   GFRNONAA >60 05/03/2016 0931   GFRAA >60 05/03/2016 0931    Assessment: 1.  Change in bowel habits: Similar symptoms but not exactly the same as previous when patient had nonspecific proctitis; suspect proctitis 2.  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  Refilled patient's Canasa suppositories at 1000 mg nightly for 2 weeks and #14. 2.  Encouraged the patient to increase fiber intake to at least 25-35 g/day, discussed how to do this through supplements or fiber 1 bar etc. 3.  Patient will follow in clinic with me in 4 weeks.  She can cancel this appointment if she is better.  Ellouise Newer, PA-C [facility] 11/19/2019, 8:35 AM  Cc: Vivi Barrack, MD

## 2019-11-25 ENCOUNTER — Telehealth: Payer: Self-pay

## 2019-11-25 NOTE — Telephone Encounter (Signed)
Patient called to cancel [place] for (12/02/19). Patient stated she "would like to push it out and follow with Dr. Sabra Heck".

## 2019-11-25 NOTE — Telephone Encounter (Signed)
Spoke with patient. Patient declines to r/s PUS with another provider. Patient request to transfer care and schedule with Dr. Sabra Heck at [facility], number provided for scheduling. Patient verbalizes understanding and is agreeable.    Routing to provider for final review. Patient is agreeable to disposition. Will close encounter.  EP:NTBHGR Carder

## 2019-11-25 NOTE — Telephone Encounter (Signed)
Left message to call Ruqayyah Lute, RN at [facility] [phone].   

## 2019-12-02 ENCOUNTER — Other Ambulatory Visit: Payer: Self-pay

## 2019-12-02 ENCOUNTER — Other Ambulatory Visit: Payer: Self-pay | Admitting: Obstetrics & Gynecology

## 2019-12-17 ENCOUNTER — Ambulatory Visit: Payer: 59 | Admitting: Physician Assistant

## 2019-12-19 ENCOUNTER — Other Ambulatory Visit: Payer: Self-pay | Admitting: Obstetrics and Gynecology

## 2019-12-19 ENCOUNTER — Other Ambulatory Visit: Payer: Self-pay | Admitting: Obstetrics & Gynecology

## 2019-12-19 DIAGNOSIS — Z1231 Encounter for screening mammogram for malignant neoplasm of breast: Secondary | ICD-10-CM

## 2020-01-21 ENCOUNTER — Other Ambulatory Visit: Payer: Self-pay | Admitting: Obstetrics & Gynecology

## 2020-01-26 ENCOUNTER — Other Ambulatory Visit: Payer: Self-pay | Admitting: Family Medicine

## 2020-01-28 ENCOUNTER — Other Ambulatory Visit: Payer: Self-pay

## 2020-01-28 ENCOUNTER — Ambulatory Visit (INDEPENDENT_AMBULATORY_CARE_PROVIDER_SITE_OTHER): Payer: 59 | Admitting: Obstetrics & Gynecology

## 2020-01-28 ENCOUNTER — Encounter: Payer: Self-pay | Admitting: Obstetrics & Gynecology

## 2020-01-28 VITALS — BP 115/69 | HR 81 | Ht 66.5 in | Wt 147.0 lb

## 2020-01-28 DIAGNOSIS — N938 Other specified abnormal uterine and vaginal bleeding: Secondary | ICD-10-CM

## 2020-01-28 DIAGNOSIS — N840 Polyp of corpus uteri: Secondary | ICD-10-CM

## 2020-01-28 NOTE — Progress Notes (Signed)
GYNECOLOGY  VISIT  CC:   Irregular bleeding  HPI: 45 y.o. G52P2012 Married White or Caucasian female here for discussion of irregular bleeding that has been primarily occurring with or after bowel movements.  She's undergone ultrasound and endometrial biopsy.  Benign polyp was seen with pathology.  She is scheduled for hysteroscopy and polyp resection, dilation and curettage.  After the biopsy, she did have some irregular bleeding but that resolved.  Cramping has resolved.  Pt reports it's been "months" since she's gone without any irregular bleeding until this last month.  She is wondering whether the hysteroscopy is needed at this time and if it ok to wait and watch.  She is also very nervous about Covid and doesn't want to increase her exposure risk if possible.  Given her bleeding pattern is improved and she did have an endometrial biopsy without abnormal cells present, I think it is fine to wait and watch.  Will send message about cancelling surgery for now.  GYNECOLOGIC HISTORY: Patient's last menstrual period was 01/20/2020 (exact date). Contraception: OCP  Patient Active Problem List   Diagnosis Date Noted  . Dysfunctional uterine bleeding, with normal [place] 11/16/2017  . Change in bowel habits 11/16/2017  . Palpitation 04/30/2017  . Paresthesias/numbness 04/24/2017  . Polyarthralgia 03/28/2017  . Dyspepsia, s/p normal EGD 03/08/17, with recommendation for lifetime anti-reflux regimen 03/26/2017  . Cervical pain with history of scoliosis 03/26/2017  . Malaise and fatigue 03/26/2017  . Tension headache 03/26/2017    Past Medical History:  Diagnosis Date  . 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 Anxiety   . Atypical chest pain 04/24/2017  . Dyspepsia, s/p normal EGD 03/08/17, with recommendation for lifetime anti-reflux regimen 03/26/2017  . GAD (generalized anxiety disorder) 05/10/2016  . History of spinal fusion for scoliosis 03/26/2017  . Left upper quadrant pain 03/26/2017  . Malaise and  fatigue 03/26/2017  . Paresthesias/numbness 04/24/2017  . Polyarthralgia 03/28/2017  . Positive Lyme disease serology   . Seasonal allergies   . Tension headache 03/26/2017    Past Surgical History:  Procedure Laterality Date  . DILATATION & CURRETTAGE/HYSTEROSCOPY WITH RESECTOCOPE  02/12/2012   Procedure: [place];  Surgeon: Azalia Bilis, MD;  Location: [facility];  Service: Gynecology;  Laterality: N/A;  . DILATION AND CURETTAGE OF UTERUS  2010  . DILATION AND CURETTAGE OF UTERUS  2012  . SPINAL FUSION  2008    MEDS:   Current Outpatient Medications on File Prior to Visit  Medication Sig Dispense Refill  . CRANBERRY PO Take by mouth.     . cyanocobalamin 100 MCG tablet Take 100 mcg by mouth every other day.     . cyclobenzaprine (FLEXERIL) 10 MG tablet Take 1 tablet (10 mg total) by mouth at bedtime. (Patient taking differently: Take 10 mg by mouth at bedtime. Takes as needed) 90 tablet 2  . mesalamine (CANASA) 1000 MG suppository Place 1 suppository (1,000 mg total) rectally at bedtime. 14 suppository 0  . Norethindrone Acetate-Ethinyl Estradiol (MICROGESTIN) 1.5-30 MG-MCG tablet Take 1 tablet by mouth daily. 3 Package 4  . omeprazole (PRILOSEC) 40 MG capsule TAKE 1 CAPSULE BY MOUTH EVERY DAY 30 capsule 2  . Prenatal Vit-Fe Fumarate-FA (MULTIVITAMIN-PRENATAL) 27-0.8 MG TABS Take 1 tablet by mouth daily.    [name] sulfamethoxazole-trimethoprim (BACTRIM DS) 800-160 MG tablet Take 1 tablet by mouth 2 (two) times daily. 6 tablet 0  . valACYclovir (VALTREX) 1000 MG tablet TAKE 1 TABLET (1,000 MG TOTAL) BY MOUTH 2 (  TWO) TIMES DAILY AS NEEDED. AS NEEDED 20 tablet 3  . VITAMIN D PO Take by mouth.     No current facility-administered medications on file prior to visit.    ALLERGIES: Patient has no known allergies.  Family History  Problem Relation Age of Onset  . Diabetes Maternal Grandmother   . Hypertension Maternal Grandmother   . Heart disease  Maternal Grandmother   . Heart attack Maternal Grandmother   . Osteoporosis Maternal Grandmother   . Diabetes Maternal Grandfather   . Hypertension Maternal Grandfather   . Heart disease Maternal Grandfather   . Heart attack Maternal Grandfather   . Hypertension Mother   . Anxiety disorder Mother   . Diabetes Mother   . Heart murmur Brother   . Colon cancer Neg Hx   . Esophageal cancer Neg Hx   . Rectal cancer Neg Hx   . Stomach cancer Neg Hx   . Breast cancer Neg Hx     SH:  Married, non smoker  Review of Systems  All other systems reviewed and are negative.   PHYSICAL EXAMINATION:    BP 115/69   Pulse 81   Ht 5' 6.5" (1.689 m)   Wt 147 lb (66.7 kg)   LMP 01/20/2020 (Exact Date)   BMI 23.37 kg/m     General appearance: alert, cooperative and appears stated age  Assessment/Plan 51. Dysfunctional uterine bleeding, with normal [place] - On OCPs and will continue.  Rx not needed at this time.  2. Endometrial polyp - Will cancel surgery for now. - AEX due in march.  Will plan to discuss bleeding pattern at that time.  If no irregular bleeding, will plan ultrasound right after end of menses to evaluate endometrium.  If thin, will be comfortable just watching.  20 minutes of total time was spent for this patient encounter, including preparation, face-to-face counseling with the patient and coordination of care, and documentation of the encounter.

## 2020-01-28 NOTE — Progress Notes (Signed)
Patient here to follow up from polyp. Patient states she might want to delay her procedure due to covid surge. Kathrene Alu RN

## 2020-01-29 ENCOUNTER — Ambulatory Visit: Payer: 59

## 2020-02-17 ENCOUNTER — Other Ambulatory Visit: Payer: Self-pay

## 2020-02-17 ENCOUNTER — Encounter: Payer: Self-pay | Admitting: Physician Assistant

## 2020-02-17 ENCOUNTER — Encounter (HOSPITAL_BASED_OUTPATIENT_CLINIC_OR_DEPARTMENT_OTHER): Payer: Self-pay

## 2020-02-17 ENCOUNTER — Ambulatory Visit (HOSPITAL_BASED_OUTPATIENT_CLINIC_OR_DEPARTMENT_OTHER): Admit: 2020-02-17 | Payer: 59 | Admitting: Obstetrics & Gynecology

## 2020-02-17 ENCOUNTER — Ambulatory Visit (INDEPENDENT_AMBULATORY_CARE_PROVIDER_SITE_OTHER): Payer: 59 | Admitting: Physician Assistant

## 2020-02-17 VITALS — BP 102/60 | HR 92 | Ht 66.5 in | Wt 151.0 lb

## 2020-02-17 DIAGNOSIS — K6289 Other specified diseases of anus and rectum: Secondary | ICD-10-CM

## 2020-02-17 DIAGNOSIS — R194 Change in bowel habit: Secondary | ICD-10-CM | POA: Diagnosis not present

## 2020-02-17 SURGERY — DILATATION AND CURETTAGE /HYSTEROSCOPY
Anesthesia: Choice

## 2020-02-17 MED ORDER — MESALAMINE 1000 MG RE SUPP: 1000.0000 mg | Freq: Every day | RECTAL | 1 refills | Status: DC

## 2020-02-17 NOTE — Progress Notes (Signed)
Chief Complaint: Change in bowel habits  HPI:    Alison Fleming is a 45 year old Caucasian female with a past medical history as listed below, known to Dr. Silverio Decamp, who returns to clinic today for follow-up of her change in bowel habits.    11/28/2017 patient had a colonoscopy with Dr. Silverio Decamp with a single erosion in the terminal ileum, patchy mild inflammation in the rectum secondary to proctitis in the entire examined colon was otherwise normal, nonbleeding internal hemorrhoids.  Patient was prescribed Canasa suppositories 1000 mg 1 per rectum nightly for 2 weeks, repeat colon recommended in 10 years.    12/21/2017 patient was seen in follow-up for clinic and was doing 95% better after using suppositories.    11/19/2019 patient seen in clinic and described that 5 months prior she is started back with sticky smelly stools similar to what she was experiencing before suppositories as above.  At that time refilled Canasa suppositories and 1000 mg nightly for 2 weeks.    Today, patient returns to clinic and tells me that she is ashamed that she never truly used the suppositories as prescribed last time.  Apparently used them for 4 days and then went away on a trip and did not restart them for about a week and a half and was not very good about being consistent with using them nightly, tells me that initially she did have some formed stools after the first 4 days, but then symptoms all returned and have really not been under control in the past 3 months.  Continues to complain of sticky smelly stools with foul-smelling gas.    Denies fever, chills or blood in her stool.  Past Medical History:  Diagnosis Date  . 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 Anxiety   . Atypical chest pain 04/24/2017  . Dyspepsia, s/p normal EGD 03/08/17, with recommendation for lifetime anti-reflux regimen 03/26/2017  . GAD (generalized anxiety disorder) 05/10/2016  . History of spinal fusion for scoliosis 03/26/2017  . Left upper  quadrant pain 03/26/2017  . Malaise and fatigue 03/26/2017  . Paresthesias/numbness 04/24/2017  . Polyarthralgia 03/28/2017  . Positive Lyme disease serology   . Seasonal allergies   . Tension headache 03/26/2017    Past Surgical History:  Procedure Laterality Date  . DILATATION & CURRETTAGE/HYSTEROSCOPY WITH RESECTOCOPE  02/12/2012   Procedure: [place];  Surgeon: Azalia Bilis, MD;  Location: [facility];  Service: Gynecology;  Laterality: N/A;  . DILATION AND CURETTAGE OF UTERUS  2010  . DILATION AND CURETTAGE OF UTERUS  2012  . SPINAL FUSION  2008    Current Outpatient Medications  Medication Sig Dispense Refill  . CRANBERRY PO Take by mouth.     . cyanocobalamin 100 MCG tablet Take 100 mcg by mouth every other day.     . cyclobenzaprine (FLEXERIL) 10 MG tablet Take 1 tablet (10 mg total) by mouth at bedtime. (Patient taking differently: Take 10 mg by mouth at bedtime. Takes as needed) 90 tablet 2  . Norethindrone Acetate-Ethinyl Estradiol (MICROGESTIN) 1.5-30 MG-MCG tablet Take 1 tablet by mouth daily. 3 Package 4  . omeprazole (PRILOSEC) 40 MG capsule TAKE 1 CAPSULE BY MOUTH EVERY DAY 30 capsule 2  . Prenatal Vit-Fe Fumarate-FA (MULTIVITAMIN-PRENATAL) 27-0.8 MG TABS Take 1 tablet by mouth daily.    . valACYclovir (VALTREX) 1000 MG tablet TAKE 1 TABLET (1,000 MG TOTAL) BY MOUTH 2 (TWO) TIMES DAILY AS NEEDED. AS NEEDED 20 tablet 3  . VITAMIN D PO Take  by mouth.     No current facility-administered medications for this visit.    Allergies as of 02/17/2020  . (No Known Allergies)    Family History  Problem Relation Age of Onset  . Diabetes Maternal Grandmother   . Hypertension Maternal Grandmother   . Heart disease Maternal Grandmother   . Heart attack Maternal Grandmother   . Osteoporosis Maternal Grandmother   . Diabetes Maternal Grandfather   . Hypertension Maternal Grandfather   . Heart disease Maternal Grandfather   . Heart attack  Maternal Grandfather   . Hypertension Mother   . Anxiety disorder Mother   . Diabetes Mother   . Heart murmur Brother   . Colon cancer Neg Hx   . Esophageal cancer Neg Hx   . Rectal cancer Neg Hx   . Stomach cancer Neg Hx   . Breast cancer Neg Hx     Social History   Socioeconomic History  . Marital status: Married    Spouse name: Not on file  . Number of children: 2  . Years of education: Not on file  . Highest education level: Not on file  Occupational History  . Occupation: HUMAN RESOURCES    Employer: [employer]  Tobacco Use  . Smoking status: Never Smoker  . Smokeless tobacco: Never Used  Vaping Use  . Vaping Use: Never used  Substance and Sexual Activity  . Alcohol use: Yes    Alcohol/week: 1.0 - 2.0 standard drink    Types: 1 - 2 Standard drinks or equivalent per week  . Drug use: No  . Sexual activity: Yes    Partners: Male    Birth control/protection: OCP  Other Topics Concern  . Not on file  Social History Narrative  . Not on file   Social Determinants of Health   Financial Resource Strain: Not on file  Food Insecurity: Not on file  Transportation Needs: Not on file  Physical Activity: Not on file  Stress: Not on file  Social Connections: Not on file  Intimate Partner Violence: Not on file    Review of Systems:    Constitutional: No weight loss, fever or chills Cardiovascular: No chest pain   Respiratory: No SOB Gastrointestinal: See HPI and otherwise negative   Physical Exam:  Vital signs: BP 102/60   Pulse 92   Ht 5' 6.5" (1.689 m)   Wt 151 lb (68.5 kg)   LMP 01/20/2020 (Exact Date)   BMI 24.01 kg/m   Constitutional:   Pleasant Caucasian female appears to be in NAD, Well developed, Well nourished, alert and cooperative  Respiratory: Respirations even and unlabored. Lungs clear to auscultation bilaterally.   No wheezes, crackles, or rhonchi.  Cardiovascular: Normal S1, S2. No MRG. Regular rate and rhythm. No peripheral  edema, cyanosis or pallor.  Gastrointestinal:  Soft, nondistended, nontender. No rebound or guarding. Normal bowel sounds. No appreciable masses or hepatomegaly. Rectal:  Not performed.  Psychiatric: Oriented to person, place and time. Demonstrates good judgement and reason without abnormal affect or behaviors.  No recent labs/imaging.  Assessment: 1.  History of nonspecific proctitis: Diagnosed 2019 after colonoscopy, symptoms of sticky, smelly stools, resolved after Canasa suppositories initially 2.  Change in bowel habits: With above  Plan: 1.  Refilled Canasa suppositories at 1000 mg nightly for 2 weeks, prescribed #14 with 1 refill. 2.  Patient will call if above does not return her to normal bowel habits.  At that time recommend repeat colonoscopy. 3.  Patient  to follow in clinic as needed.  Alison Newer, PA-C [facility] 02/17/2020, 9:03 AM  Cc: Vivi Barrack, MD

## 2020-02-17 NOTE — Patient Instructions (Signed)
If you are age 44 or older, your body mass index should be between 23-30. Your Body mass index is 24.01 kg/m. If this is out of the aforementioned range listed, please consider follow up with your Primary Care Provider.  If you are age 62 or younger, your body mass index should be between 19-25. Your Body mass index is 24.01 kg/m. If this is out of the aformentioned range listed, please consider follow up with your Primary Care Provider.   We have sent the following medications to your pharmacy for you to pick up at your convenience: Cansa 1,000 mg   Please call if you are not better.  Thank you for choosing me and [facility].  Ellouise Newer, PA-C

## 2020-02-18 ENCOUNTER — Ambulatory Visit: Payer: 59

## 2020-02-20 ENCOUNTER — Other Ambulatory Visit: Payer: Self-pay

## 2020-02-20 ENCOUNTER — Ambulatory Visit
Admission: RE | Admit: 2020-02-20 | Discharge: 2020-02-20 | Disposition: A | Discharge status: Home / Self Care | Payer: 59 | Source: Ambulatory Visit | Attending: Obstetrics & Gynecology | Admitting: Obstetrics & Gynecology

## 2020-02-20 DIAGNOSIS — Z1231 Encounter for screening mammogram for malignant neoplasm of breast: Secondary | ICD-10-CM

## 2020-02-23 ENCOUNTER — Other Ambulatory Visit: Payer: Self-pay | Admitting: Obstetrics & Gynecology

## 2020-02-23 DIAGNOSIS — R928 Other abnormal and inconclusive findings on diagnostic imaging of breast: Secondary | ICD-10-CM

## 2020-02-25 ENCOUNTER — Other Ambulatory Visit: Payer: Self-pay

## 2020-02-25 ENCOUNTER — Ambulatory Visit
Admission: RE | Admit: 2020-02-25 | Discharge: 2020-02-25 | Disposition: A | Discharge status: Home / Self Care | Payer: 59 | Source: Ambulatory Visit | Attending: Obstetrics & Gynecology | Admitting: Obstetrics & Gynecology

## 2020-02-25 DIAGNOSIS — R928 Other abnormal and inconclusive findings on diagnostic imaging of breast: Secondary | ICD-10-CM

## 2020-03-12 ENCOUNTER — Telehealth: Payer: Self-pay

## 2020-03-12 NOTE — Telephone Encounter (Signed)
-----   Message from Levin Erp, [place] sent at 03/12/2020 10:05 AM EST ----- Regarding: colon Can you call this patient and go ahead and schedule repeat colonoscopy for rectal bleeding with Dr. Silverio Decamp.  Adella Hare

## 2020-03-12 NOTE — Telephone Encounter (Signed)
Spoke with patient, she has been scheduled for a pre-visit on Wednesday, 05/05/20 at 8 AM. Colonoscopy is scheduled with Dr. Silverio Decamp on Monday, 05/17/20 at 9:30 AM, arrive at 8:30 AM - patient is aware that she will need a care partner this day. Patient verbalized understanding and had no concerns at the end of the call.

## 2020-03-15 NOTE — Progress Notes (Signed)
Reviewed and agree with documentation and assessment and plan. K. Veena Nandigam , MD   

## 2020-03-22 ENCOUNTER — Other Ambulatory Visit: Payer: Self-pay

## 2020-03-22 ENCOUNTER — Encounter: Payer: Self-pay | Admitting: Family Medicine

## 2020-03-22 ENCOUNTER — Ambulatory Visit (INDEPENDENT_AMBULATORY_CARE_PROVIDER_SITE_OTHER): Payer: 59 | Admitting: Family Medicine

## 2020-03-22 VITALS — BP 120/70 | HR 77 | Temp 98.3°F | Ht 66.5 in | Wt 154.6 lb

## 2020-03-22 DIAGNOSIS — Z0001 Encounter for general adult medical examination with abnormal findings: Secondary | ICD-10-CM | POA: Diagnosis not present

## 2020-03-22 DIAGNOSIS — E7800 Pure hypercholesterolemia, unspecified: Secondary | ICD-10-CM | POA: Diagnosis not present

## 2020-03-22 DIAGNOSIS — R194 Change in bowel habit: Secondary | ICD-10-CM

## 2020-03-22 DIAGNOSIS — L678 Other hair color and hair shaft abnormalities: Secondary | ICD-10-CM | POA: Diagnosis not present

## 2020-03-22 DIAGNOSIS — F419 Anxiety disorder, unspecified: Secondary | ICD-10-CM

## 2020-03-22 DIAGNOSIS — R5383 Other fatigue: Secondary | ICD-10-CM

## 2020-03-22 DIAGNOSIS — R739 Hyperglycemia, unspecified: Secondary | ICD-10-CM

## 2020-03-22 LAB — COMPREHENSIVE METABOLIC PANEL
ALT: 21 U/L (ref 0–35)
AST: 19 U/L (ref 0–37)
Albumin: 4.1 g/dL (ref 3.5–5.2)
Alkaline Phosphatase: 46 U/L (ref 39–117)
BUN: 13 mg/dL (ref 6–23)
CO2: 29 mEq/L (ref 19–32)
Calcium: 9.9 mg/dL (ref 8.4–10.5)
Chloride: 102 mEq/L (ref 96–112)
Creatinine, Ser: 0.73 mg/dL (ref 0.40–1.20)
GFR: 100.84 mL/min (ref >60.00)
Glucose, Bld: 87 mg/dL (ref 70–99)
Potassium: 3.8 mEq/L (ref 3.5–5.1)
Sodium: 138 mEq/L (ref 135–145)
Total Bilirubin: 0.7 mg/dL (ref 0.2–1.2)
Total Protein: 7.1 g/dL (ref 6.0–8.3)

## 2020-03-22 LAB — CBC
HCT: 37.5 % (ref 36.0–46.0)
Hemoglobin: 13.1 g/dL (ref 12.0–15.0)
MCHC: 34.8 g/dL (ref 30.0–36.0)
MCV: 89.3 fl (ref 78.0–100.0)
Platelets: 279 10*3/uL (ref 150.0–400.0)
RBC: 4.2 Mil/uL (ref 3.87–5.11)
RDW: 12.7 % (ref 11.5–15.5)
WBC: 5 10*3/uL (ref 4.0–10.5)

## 2020-03-22 LAB — LIPID PANEL
Cholesterol: 192 mg/dL (ref 0–200)
HDL: 62.5 mg/dL (ref >39.00)
LDL Cholesterol: 106 mg/dL — ABNORMAL HIGH (ref 0–99)
NonHDL: 129.43
Total CHOL/HDL Ratio: 3
Triglycerides: 119 mg/dL (ref 0.0–149.0)
VLDL: 23.8 mg/dL (ref 0.0–40.0)

## 2020-03-22 LAB — IBC + FERRITIN
Ferritin: 253.9 ng/mL (ref 10.0–291.0)
Iron: 233 ug/dL — ABNORMAL HIGH (ref 42–145)
Saturation Ratios: 84.5 % — ABNORMAL HIGH (ref 20.0–50.0)
Transferrin: 197 mg/dL — ABNORMAL LOW (ref 212.0–360.0)

## 2020-03-22 LAB — TSH: TSH: 0.91 u[IU]/mL (ref 0.35–4.50)

## 2020-03-22 LAB — HEMOGLOBIN A1C: Hgb A1c MFr Bld: 5.1 % (ref 4.6–6.5)

## 2020-03-22 NOTE — Patient Instructions (Signed)
It was very nice to see you today!  You can try taking a small amount of caffeine to see if this helps with your focus.  Please try taking 1000 to 2000 mg of actual oral daily to see if this helps with the anxiety.  We will check blood work today.  I will see back in year for your next physical.  Come back to see me sooner if needed.  Take care, Dr Jerline Pain  Please try these tips to maintain a healthy lifestyle:   Eat at least 3 REAL meals and 1-2 snacks per day.  Aim for no more than 5 hours between eating.  If you eat breakfast, please do so within one hour of getting up.    Each meal should contain half fruits/vegetables, one quarter protein, and one quarter carbs (no bigger than a computer mouse)   Cut down on sweet beverages. This includes juice, soda, and sweet tea.     Drink at least 1 glass of water with each meal and aim for at least 8 glasses per day   Exercise at least 150 minutes every week.    Preventive Care 12-34 Years Old, Female Preventive care refers to lifestyle choices and visits with your health care provider that can promote health and wellness. This includes:  A yearly physical exam. This is also called an annual wellness visit.  Regular dental and eye exams.  Immunizations.  Screening for certain conditions.  Healthy lifestyle choices, such as: ? Eating a healthy diet. ? Getting regular exercise. ? Not using drugs or products that contain nicotine and tobacco. ? Limiting alcohol use. What can I expect for my preventive care visit? Physical exam Your health care provider will check your:  Height and weight. These may be used to calculate your BMI (body mass index). BMI is a measurement that tells if you are at a healthy weight.  Heart rate and blood pressure.  Body temperature.  Skin for abnormal spots. Counseling Your health care provider may ask you questions about your:  Past medical problems.  Family's medical  history.  Alcohol, tobacco, and drug use.  Emotional well-being.  Home life and relationship well-being.  Sexual activity.  Diet, exercise, and sleep habits.  Work and work Statistician.  Access to firearms.  Method of birth control.  Menstrual cycle.  Pregnancy history. What immunizations do I need? Vaccines are usually given at various ages, according to a schedule. Your health care provider will recommend vaccines for you based on your age, medical history, and lifestyle or other factors, such as travel or where you work.   What tests do I need? Blood tests  Lipid and cholesterol levels. These may be checked every 5 years, or more often if you are over 44 years old.  Hepatitis C test.  Hepatitis B test. Screening  Lung cancer screening. You may have this screening every year starting at age 11 if you have a 30-pack-year history of smoking and currently smoke or have quit within the past 15 years.  Colorectal cancer screening. ? All adults should have this screening starting at age 27 and continuing until age 58. ? Your health care provider may recommend screening at age 57 if you are at increased risk. ? You will have tests every 1-10 years, depending on your results and the type of screening test.  Diabetes screening. ? This is done by checking your blood sugar (glucose) after you have not eaten for a while (fasting). ? You may  have this done every 1-3 years.  Mammogram. ? This may be done every 1-2 years. ? Talk with your health care provider about when you should start having regular mammograms. This may depend on whether you have a family history of breast cancer.  BRCA-related cancer screening. This may be done if you have a family history of breast, ovarian, tubal, or peritoneal cancers.  Pelvic exam and Pap test. ? This may be done every 3 years starting at age 63. ? Starting at age 13, this may be done every 5 years if you have a Pap test in combination  with an HPV test. Other tests  STD (sexually transmitted disease) testing, if you are at risk.  Bone density scan. This is done to screen for osteoporosis. You may have this scan if you are at high risk for osteoporosis. Talk with your health care provider about your test results, treatment options, and if necessary, the need for more tests. Follow these instructions at home: Eating and drinking  Eat a diet that includes fresh fruits and vegetables, whole grains, lean protein, and low-fat dairy products.  Take vitamin and mineral supplements as recommended by your health care provider.  Do not drink alcohol if: ? Your health care provider tells you not to drink. ? You are pregnant, may be pregnant, or are planning to become pregnant.  If you drink alcohol: ? Limit how much you have to 0-1 drink a day. ? Be aware of how much alcohol is in your drink. In the [place] one drink equals one 12 oz bottle of beer (355 mL), one 5 oz glass of wine (148 mL), or one 1 oz glass of hard liquor (44 mL).   Lifestyle  Take daily care of your teeth and gums. Brush your teeth every morning and night with fluoride toothpaste. Floss one time each day.  Stay active. Exercise for at least 30 minutes 5 or more days each week.  Do not use any products that contain nicotine or tobacco, such as cigarettes, e-cigarettes, and chewing tobacco. If you need help quitting, ask your health care provider.  Do not use drugs.  If you are sexually active, practice safe sex. Use a condom or other form of protection to prevent STIs (sexually transmitted infections).  If you do not wish to become pregnant, use a form of birth control. If you plan to become pregnant, see your health care provider for a prepregnancy visit.  If told by your health care provider, take low-dose aspirin daily starting at age 68.  Find healthy ways to cope with stress, such as: ? Meditation, yoga, or listening to  music. ? Journaling. ? Talking to a trusted person. ? Spending time with friends and family. Safety  Always wear your seat belt while driving or riding in a vehicle.  Do not drive: ? If you have been drinking alcohol. Do not ride with someone who has been drinking. ? When you are tired or distracted. ? While texting.  Wear a helmet and other protective equipment during sports activities.  If you have firearms in your house, make sure you follow all gun safety procedures. What's next?  Visit your health care provider once a year for an annual wellness visit.  Ask your health care provider how often you should have your eyes and teeth checked.  Stay up to date on all vaccines. This information is not intended to replace advice given to you by your health care provider. Make sure you discuss  any questions you have with your health care provider. Document Revised: 10/07/2019 Document Reviewed: 09/13/2017 Elsevier Patient Education  2021 [employer].

## 2020-03-22 NOTE — Assessment & Plan Note (Signed)
Following with GI and has upcoming colonoscopy.

## 2020-03-22 NOTE — Progress Notes (Signed)
Chief Complaint:  Alison Fleming is a 45 y.o. female who presents today for her annual comprehensive physical exam.    Assessment/Plan:  New/Acute Problems: Inattention Likely multifactorial.  Sleep quality seems to be okay.  Has quite a bit of underlying anxiety.  Discussed role of caffeine in moderation.  She can also try using fish oil to help with anxiety to see if this helps.  Chronic Problems Addressed Today: Change in bowel habits Following with GI and has upcoming colonoscopy.   Preventative Healthcare: Check labs today.  Up-to-date on Pap and mammogram.  Will be getting colonoscopy in a couple of months.  Patient Counseling(The following topics were reviewed and/or handout was given):  -Nutrition: Stressed importance of moderation in sodium/caffeine intake, saturated fat and cholesterol, caloric balance, sufficient intake of fresh fruits, vegetables, and fiber.  -Stressed the importance of regular exercise.   -Substance Abuse: Discussed cessation/primary prevention of tobacco, alcohol, or other drug use; driving or other dangerous activities under the influence; availability of treatment for abuse.   -Injury prevention: Discussed safety belts, safety helmets, smoke detector, smoking near bedding or upholstery.   -Sexuality: Discussed sexually transmitted diseases, partner selection, use of condoms, avoidance of unintended pregnancy and contraceptive alternatives.   -Dental health: Discussed importance of regular tooth brushing, flossing, and dental visits.  -Health maintenance and immunizations reviewed. Please refer to Health maintenance section.  Return to care in 1 year for next preventative visit.     Subjective:  HPI:  She has no acute complaints today.  She would like to discuss natural ways to improve her focus.  She has quite a bit of anxiety.  She is not interested in starting medications.  Lifestyle Diet: Balanced.  Exercise: Limited.   Depression screen PHQ  2/9 03/22/2020  Decreased Interest 0  Down, Depressed, Hopeless 0  PHQ - 2 Score 0  Altered sleeping -  Tired, decreased energy -  Change in appetite -  Feeling bad or failure about yourself  -  Trouble concentrating -  Moving slowly or fidgety/restless -  Suicidal thoughts -  PHQ-9 Score -  Difficult doing work/chores -    Health Maintenance Due  Topic Date Due  . Hepatitis C Screening  Never done     ROS: Per HPI, otherwise a complete review of systems was negative.   PMH:  The following were reviewed and entered/updated in epic: Past Medical History:  Diagnosis Date  . 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 Anxiety   . Atypical chest pain 04/24/2017  . Dyspepsia, s/p normal EGD 03/08/17, with recommendation for lifetime anti-reflux regimen 03/26/2017  . GAD (generalized anxiety disorder) 05/10/2016  . History of spinal fusion for scoliosis 03/26/2017  . Left upper quadrant pain 03/26/2017  . Malaise and fatigue 03/26/2017  . Paresthesias/numbness 04/24/2017  . Polyarthralgia 03/28/2017  . Positive Lyme disease serology   . Seasonal allergies   . Tension headache 03/26/2017   Patient Active Problem List   Diagnosis Date Noted  . Dysfunctional uterine bleeding, with normal [place] 11/16/2017  . Change in bowel habits 11/16/2017  . Palpitation 04/30/2017  . Paresthesias/numbness 04/24/2017  . Polyarthralgia 03/28/2017  . Dyspepsia, s/p normal EGD 03/08/17, with recommendation for lifetime anti-reflux regimen 03/26/2017  . Cervical pain with history of scoliosis 03/26/2017  . Malaise and fatigue 03/26/2017  . Tension headache 03/26/2017   Past Surgical History:  Procedure Laterality Date  . DILATATION & CURRETTAGE/HYSTEROSCOPY WITH RESECTOCOPE  02/12/2012   Procedure: DILATATION & CURETTAGE/HYSTEROSCOPY WITH RESECTOCOPE;  Surgeon: Azalia Bilis, MD;  Location: [facility];  Service: Gynecology;  Laterality: N/A;  . DILATION AND CURETTAGE OF UTERUS  2010  . DILATION AND CURETTAGE OF UTERUS   2012  . SPINAL FUSION  2008    Family History  Problem Relation Age of Onset  . Diabetes Maternal Grandmother   . Hypertension Maternal Grandmother   . Heart disease Maternal Grandmother   . Heart attack Maternal Grandmother   . Osteoporosis Maternal Grandmother   . Diabetes Maternal Grandfather   . Hypertension Maternal Grandfather   . Heart disease Maternal Grandfather   . Heart attack Maternal Grandfather   . Hypertension Mother   . Anxiety disorder Mother   . Diabetes Mother   . Heart murmur Brother   . Colon cancer Neg Hx   . Esophageal cancer Neg Hx   . Rectal cancer Neg Hx   . Stomach cancer Neg Hx   . Breast cancer Neg Hx     Medications- reviewed and updated Current Outpatient Medications  Medication Sig Dispense Refill  . CRANBERRY PO Take by mouth.     . cyanocobalamin 100 MCG tablet Take 100 mcg by mouth every other day.     . cyclobenzaprine (FLEXERIL) 10 MG tablet Take 1 tablet (10 mg total) by mouth at bedtime. (Patient taking differently: Take 10 mg by mouth at bedtime. Takes as needed) 90 tablet 2  . Norethindrone Acetate-Ethinyl Estradiol (MICROGESTIN) 1.5-30 MG-MCG tablet Take 1 tablet by mouth daily. 3 Package 4  . omeprazole (PRILOSEC) 40 MG capsule TAKE 1 CAPSULE BY MOUTH EVERY DAY 30 capsule 2  . Prenatal Vit-Fe Fumarate-FA (MULTIVITAMIN-PRENATAL) 27-0.8 MG TABS Take 1 tablet by mouth daily.    . valACYclovir (VALTREX) 1000 MG tablet TAKE 1 TABLET (1,000 MG TOTAL) BY MOUTH 2 (TWO) TIMES DAILY AS NEEDED. AS NEEDED 20 tablet 3  . VITAMIN D PO Take by mouth.     No current facility-administered medications for this visit.    Allergies-reviewed and updated No Known Allergies  Social History   Socioeconomic History  . Marital status: Married    Spouse name: Not on file  . Number of children: 2  . Years of education: Not on file  . Highest education level: Not on file  Occupational History  . Occupation: HUMAN RESOURCES    Employer: [employer]  Tobacco Use  . Smoking status: Never Smoker  . Smokeless tobacco: Never Used  Vaping Use  . Vaping Use: Never used  Substance and Sexual Activity  . Alcohol use: Yes    Alcohol/week: 1.0 - 2.0 standard drink    Types: 1 - 2 Standard drinks or equivalent per week  . Drug use: No  . Sexual activity: Yes    Partners: Male    Birth control/protection: OCP  Other Topics Concern  . Not on file  Social History Narrative  . Not on file   Social Determinants of Health   Financial Resource Strain: Not on file  Food Insecurity: Not on file  Transportation Needs: Not on file  Physical Activity: Not on file  Stress: Not on file  Social Connections: Not on file        Objective:  Physical Exam: BP 120/70   Pulse 77   Temp 98.3 F (36.8 C) (Temporal)   Ht 5' 6.5" (1.689 m)   Wt 154 lb 9.6 oz (70.1 kg)   SpO2 99%   BMI 24.58 kg/m   Body mass index is  24.58 kg/m. Wt Readings from Last 3 Encounters:  03/22/20 154 lb 9.6 oz (70.1 kg)  02/17/20 151 lb (68.5 kg)  01/28/20 147 lb (66.7 kg)   Gen: NAD, resting comfortably HEENT: TMs normal bilaterally. OP clear. No thyromegaly noted.  CV: RRR with no murmurs appreciated Pulm: NWOB, CTAB with no crackles, wheezes, or rhonchi GI: Normal bowel sounds present. Soft, Nontender, Nondistended. MSK: no edema, cyanosis, or clubbing noted Skin: warm, dry Neuro: CN2-12 grossly intact. Strength 5/5 in upper and lower extremities. Reflexes symmetric and intact bilaterally.  Psych: Normal affect and thought content     Caleb M. Jerline Pain, MD 03/22/2020 9:20 AM

## 2020-03-23 NOTE — Progress Notes (Signed)
Please inform patient of the following:  Labs are all stable. Would like for her to keep up the good work with diet and exercise. Do not need to make any changes to her treatment plan at this time. We can recheck in a year.  Algis Greenhouse. Jerline Pain, MD 03/23/2020 9:36 AM

## 2020-03-24 ENCOUNTER — Encounter: Payer: Self-pay | Admitting: Family Medicine

## 2020-03-24 NOTE — Telephone Encounter (Signed)
  Lab results given to patient, patient has concerned  Please advise

## 2020-03-25 ENCOUNTER — Encounter (HOSPITAL_BASED_OUTPATIENT_CLINIC_OR_DEPARTMENT_OTHER): Payer: Self-pay

## 2020-03-29 NOTE — Telephone Encounter (Signed)
See note

## 2020-04-24 ENCOUNTER — Other Ambulatory Visit: Payer: Self-pay | Admitting: Family Medicine

## 2020-04-26 ENCOUNTER — Ambulatory Visit (INDEPENDENT_AMBULATORY_CARE_PROVIDER_SITE_OTHER): Payer: 59 | Admitting: Obstetrics & Gynecology

## 2020-04-26 ENCOUNTER — Other Ambulatory Visit (HOSPITAL_COMMUNITY)
Admission: RE | Admit: 2020-04-26 | Discharge: 2020-04-26 | Disposition: A | Discharge status: Home / Self Care | Payer: 59 | Source: Ambulatory Visit | Attending: Obstetrics & Gynecology | Admitting: Obstetrics & Gynecology

## 2020-04-26 ENCOUNTER — Other Ambulatory Visit: Payer: Self-pay

## 2020-04-26 ENCOUNTER — Encounter (HOSPITAL_BASED_OUTPATIENT_CLINIC_OR_DEPARTMENT_OTHER): Payer: Self-pay | Admitting: Obstetrics & Gynecology

## 2020-04-26 VITALS — BP 121/79 | HR 82 | Resp 17 | Ht 66.0 in | Wt 155.0 lb

## 2020-04-26 DIAGNOSIS — Z01419 Encounter for gynecological examination (general) (routine) without abnormal findings: Secondary | ICD-10-CM | POA: Diagnosis not present

## 2020-04-26 DIAGNOSIS — Z124 Encounter for screening for malignant neoplasm of cervix: Secondary | ICD-10-CM | POA: Insufficient documentation

## 2020-04-26 DIAGNOSIS — B009 Herpesviral infection, unspecified: Secondary | ICD-10-CM | POA: Diagnosis not present

## 2020-04-26 DIAGNOSIS — N907 Vulvar cyst: Secondary | ICD-10-CM | POA: Insufficient documentation

## 2020-04-26 DIAGNOSIS — N939 Abnormal uterine and vaginal bleeding, unspecified: Secondary | ICD-10-CM | POA: Diagnosis not present

## 2020-04-26 DIAGNOSIS — B001 Herpesviral vesicular dermatitis: Secondary | ICD-10-CM

## 2020-04-26 DIAGNOSIS — Z3041 Encounter for surveillance of contraceptive pills: Secondary | ICD-10-CM

## 2020-04-26 DIAGNOSIS — N840 Polyp of corpus uteri: Secondary | ICD-10-CM

## 2020-04-26 MED ORDER — NORETHINDRONE ACET-ETHINYL EST 1.5-30 MG-MCG PO TABS: 1.0000 | ORAL_TABLET | Freq: Every day | ORAL | 4 refills | Status: DC

## 2020-04-26 MED ORDER — VALACYCLOVIR HCL 1 G PO TABS: ORAL_TABLET | ORAL | 3 refills | Status: DC

## 2020-04-26 NOTE — Progress Notes (Signed)
45 y.o. G86P2012 Married White or Caucasian female here for annual exam.  Has experienced BTB for several years.  Was having post coital bleeding but this has resolved.  She's "back to her normal break through bleeding".  Cycles are 28 days and flow lasts for 3.  Ultrasound 02/2019 with about 3m endometrium.  Endometrial biopsy 10/2019 with endometrial polyp noted.  We have discussed hysteroscopy and this was actually planned but as post coital bleeding has resolved, she has decided to continue to monitor.  Reviewed again with pt that treatment for symptoms improvement is reasonable but not necessary required.  So, for now, we will just keep watching.    She does desire yearly pap smear screening.  Lab work from March reviewed with pt.  Iron elevated.  Ferritin normal.  She takes PNV with iron.  She will switch to PNV without iron.  Patient's last menstrual period was 04/13/2020.          Sexually active: Yes.    The current method of family planning is OCP (estrogen/progesterone).   Exercising: Yes.    walking Smoker:  no  Health Maintenance: Pap:  2021, neg with neg HR HPV History of abnormal Pap:  no MMG:  02/25/2020 Colonoscopy:  11/19, biopsy with inflammation.  Colonoscopy scheduled 06/03/2020 with Dr. NSilverio Decampnow due to intermittent diarrhea TDaP:  2017 Hep C testing: has donated blood Screening Labs: reviewed from March   reports that she has never smoked. She has never used smokeless tobacco. She reports current alcohol use of about 1.0 - 2.0 standard drink of alcohol per week. She reports that she does not use drugs.  Past Medical History:  Diagnosis Date  . 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 Anxiety   . Atypical chest pain 04/24/2017  . Dyspepsia, s/p normal EGD 03/08/17, with recommendation for lifetime anti-reflux regimen 03/26/2017  . GAD (generalized anxiety disorder) 05/10/2016  . History of spinal fusion for scoliosis 03/26/2017  . Left upper quadrant pain 03/26/2017  . Malaise  and fatigue 03/26/2017  . Paresthesias/numbness 04/24/2017  . Polyarthralgia 03/28/2017  . Positive Lyme disease serology   . Seasonal allergies   . Tension headache 03/26/2017    Past Surgical History:  Procedure Laterality Date  . DILATATION & CURRETTAGE/HYSTEROSCOPY WITH RESECTOCOPE  02/12/2012   Procedure: [place]  Surgeon: TAzalia Bilis MD;  [facility];  Service: Gynecology;  Laterality: N/A;  . DILATION AND CURETTAGE OF UTERUS  2010  . DILATION AND CURETTAGE OF UTERUS  2012  . SPINAL FUSION  2008    Current Outpatient Medications  Medication Sig Dispense Refill  . CRANBERRY PO Take by mouth.     . cyanocobalamin 100 MCG tablet Take 100 mcg by mouth every other day.     . cyclobenzaprine (FLEXERIL) 10 MG tablet Take 1 tablet (10 mg total) by mouth at bedtime. (Patient taking differently: Take 10 mg by mouth at bedtime. Takes as needed) 90 tablet 2  . Norethindrone Acetate-Ethinyl Estradiol (MICROGESTIN) 1.5-30 MG-MCG tablet Take 1 tablet by mouth daily. 3 Package 4  . omeprazole (PRILOSEC) 40 MG capsule TAKE 1 CAPSULE BY MOUTH EVERY DAY 30 capsule 2  . Prenatal Vit-Fe Fumarate-FA (MULTIVITAMIN-PRENATAL) 27-0.8 MG TABS Take 1 tablet by mouth daily.    . valACYclovir (VALTREX) 1000 MG tablet TAKE 1 TABLET (1,000 MG TOTAL) BY MOUTH 2 (TWO) TIMES DAILY AS NEEDED. AS NEEDED 20 tablet 3  . VITAMIN D PO Take by mouth.  No current facility-administered medications for this visit.    Family History  Problem Relation Age of Onset  . Diabetes Maternal Grandmother   . Hypertension Maternal Grandmother   . Heart disease Maternal Grandmother   . Heart attack Maternal Grandmother   . Osteoporosis Maternal Grandmother   . Diabetes Maternal Grandfather   . Hypertension Maternal Grandfather   . Heart disease Maternal Grandfather   . Heart attack Maternal Grandfather   . Hypertension Mother   . Anxiety disorder Mother   . Diabetes  Mother   . Heart murmur Brother   . Colon cancer Neg Hx   . Esophageal cancer Neg Hx   . Rectal cancer Neg Hx   . Stomach cancer Neg Hx   . Breast cancer Neg Hx     Review of Systems  All other systems reviewed and are negative.   Exam:   BP 121/79   Pulse 82   Resp 17   Ht 5' 6"  (1.676 m)   Wt 155 lb (70.3 kg)   LMP 04/13/2020   BMI 25.02 kg/m   Height: 5' 6"  (167.6 cm)  General appearance: alert, cooperative and appears stated age Head: Normocephalic, without obvious abnormality, atraumatic Neck: no adenopathy, supple, symmetrical, trachea midline and thyroid normal to inspection and palpation Lungs: clear to auscultation bilaterally Breasts: normal appearance, no masses or tenderness Heart: regular rate and rhythm Abdomen: soft, non-tender; bowel sounds normal; no masses,  no organomegaly Extremities: extremities normal, atraumatic, no cyanosis or edema Skin: Skin color, texture, turgor normal. No rashes or lesions Lymph nodes: Cervical, supraclavicular, and axillary nodes normal. No abnormal inguinal nodes palpated Neurologic: Grossly normal   Pelvic: External genitalia:  2cm inclusion cyst on right perineal body, soft, non tender              Urethra:  normal appearing urethra with no masses, tenderness or lesions              Bartholins and Skenes: normal                 Vagina: normal appearing vagina with normal color and discharge, no lesions              Cervix: no lesions              Pap taken: Yes.   Bimanual Exam:  Uterus:  normal size, contour, position, consistency, mobility, non-tender              Adnexa: normal adnexa and no mass, fullness, tenderness               Rectovaginal: Confirms               Anus:  normal sphincter tone, no lesions  Chaperone, Shela Nevin, RN, was present for exam.  Assessment/Plan: 1. Well woman exam with routine gynecological exam - pap only obtained today per pt request - MMG 02/2020 - has colonoscopy scheduled  for May - vaccines updated  2. Abnormal uterine bleeding due to endometrial polyp - d/w pt monitoring and if there are changes, plan to remove polyp with hysteroscopy  3. Iron excess - ferritin normal.  Pt will change to vitamin without iron  4. Recurrent cold sores - valACYclovir (VALTREX) 1000 MG tablet; Take 2 tabs x 1 and repeat in 12 hours with symptom onset.  Dispense: 30 tablet; Refill: 3  5. Encounter for surveillance of contraceptive pills - Norethindrone Acetate-Ethinyl Estradiol (MICROGESTIN) 1.5-30 MG-MCG tablet; Take 1 tablet  by mouth daily.  Dispense: 84 tablet; Refill: 4  7. Inclusion cyst of vulva - stable on exam

## 2020-04-27 LAB — CYTOLOGY - PAP: Diagnosis: NEGATIVE

## 2020-05-05 ENCOUNTER — Ambulatory Visit (AMBULATORY_SURGERY_CENTER): Payer: Self-pay

## 2020-05-05 ENCOUNTER — Other Ambulatory Visit (HOSPITAL_BASED_OUTPATIENT_CLINIC_OR_DEPARTMENT_OTHER): Payer: Self-pay | Admitting: Obstetrics & Gynecology

## 2020-05-05 ENCOUNTER — Other Ambulatory Visit: Payer: Self-pay

## 2020-05-05 VITALS — Ht 66.0 in | Wt 157.0 lb

## 2020-05-05 DIAGNOSIS — R194 Change in bowel habit: Secondary | ICD-10-CM

## 2020-05-05 DIAGNOSIS — B001 Herpesviral vesicular dermatitis: Secondary | ICD-10-CM

## 2020-05-05 DIAGNOSIS — B009 Herpesviral infection, unspecified: Secondary | ICD-10-CM

## 2020-05-05 MED ORDER — NA SULFATE-K SULFATE-MG SULF 17.5-3.13-1.6 GM/177ML PO SOLN: 1.0000 | Freq: Once | ORAL | 0 refills | Status: AC

## 2020-05-05 NOTE — Progress Notes (Signed)
No allergies to soy or egg Pt is not on blood thinners or diet pills Denies issues with sedation/intubation Denies atrial flutter/fib Denies constipation    Pt is aware of Covid safety and care partner requirements.   Instructed pt to stop taking the prenatals with iron, but was told she could take prenatals without iron.

## 2020-05-17 ENCOUNTER — Other Ambulatory Visit: Payer: Self-pay

## 2020-05-17 ENCOUNTER — Encounter: Payer: Self-pay | Admitting: Gastroenterology

## 2020-05-17 ENCOUNTER — Ambulatory Visit (AMBULATORY_SURGERY_CENTER): Payer: 59 | Admitting: Gastroenterology

## 2020-05-17 VITALS — BP 113/77 | HR 80 | Temp 98.6°F | Resp 12 | Ht 66.0 in | Wt 157.0 lb

## 2020-05-17 DIAGNOSIS — K6289 Other specified diseases of anus and rectum: Secondary | ICD-10-CM | POA: Diagnosis not present

## 2020-05-17 DIAGNOSIS — D120 Benign neoplasm of cecum: Secondary | ICD-10-CM | POA: Diagnosis not present

## 2020-05-17 DIAGNOSIS — K625 Hemorrhage of anus and rectum: Secondary | ICD-10-CM

## 2020-05-17 DIAGNOSIS — K529 Noninfective gastroenteritis and colitis, unspecified: Secondary | ICD-10-CM

## 2020-05-17 DIAGNOSIS — K635 Polyp of colon: Secondary | ICD-10-CM

## 2020-05-17 MED ORDER — SODIUM CHLORIDE 0.9 % IV SOLN: 500.0000 mL | Freq: Once | INTRAVENOUS | Status: DC

## 2020-05-17 NOTE — Op Note (Signed)
[facility] Patient Name: Alison Fleming Procedure Date: 05/17/2020 10:01 AM MRN: [id number] Endoscopist: Mauri Pole , MD Age: 45 Referring MD:  Date of Birth: 12-23-1975 Gender: Female Account #: [id number] Procedure:                Colonoscopy Indications:              Evaluation of unexplained GI bleeding presenting                            with Hematochezia, Obtain more precise diagnosis of                            inflammatory bowel disease, Change in bowel habits Medicines:                Monitored Anesthesia Care Procedure:                Pre-Anesthesia Assessment:                           - Prior to the procedure, a History and Physical                            was performed, and patient medications and                            allergies were reviewed. The patient's tolerance of                            previous anesthesia was also reviewed. The risks                            and benefits of the procedure and the sedation                            options and risks were discussed with the patient.                            All questions were answered, and informed consent                            was obtained. Prior Anticoagulants: The patient has                            taken no previous anticoagulant or antiplatelet                            agents. ASA Grade Assessment: II - A patient with                            mild systemic disease. After reviewing the risks                            and benefits, the patient was deemed in  satisfactory condition to undergo the procedure.                           After obtaining informed consent, the colonoscope                            was passed under direct vision. Throughout the                            procedure, the patient's blood pressure, pulse, and                            oxygen saturations were monitored continuously. The                             Olympus PCF-H190DL ([id number]) Colonoscope was                            introduced through the anus and advanced to the the                            terminal ileum, with identification of the                            appendiceal orifice and IC valve. The colonoscopy                            was performed without difficulty. The patient                            tolerated the procedure well. The quality of the                            bowel preparation was good. The terminal ileum,                            ileocecal valve, appendiceal orifice, and rectum                            were photographed. Scope In: 10:10:58 AM Scope Out: 10:24:45 AM Scope Withdrawal Time: 0 hours 9 minutes 21 seconds  Total Procedure Duration: 0 hours 13 minutes 47 seconds  Findings:                 The perianal and digital rectal examinations were                            normal.                           A 5 mm polyp was found in the cecum. The polyp was                            sessile. The polyp was removed with a cold snare.  Resection and retrieval were complete.                           A patchy area of mildly erythematous mucosa was                            found in the rectum, in the sigmoid colon, in the                            descending colon, in the transverse colon, in the                            ascending colon and in the cecum. Biopsies were                            taken with a cold forceps for histology.                           A less than 1 mm healed ulcer was found in the                            rectum. Adjacent mucosal findings include erythema.                            Biopsies were taken with a cold forceps for                            histology.                           Non-bleeding internal hemorrhoids were found during                            retroflexion. The hemorrhoids were medium-sized. Complications:            No  immediate complications. Estimated Blood Loss:     Estimated blood loss was minimal. Impression:               - One 5 mm polyp in the cecum, removed with a cold                            snare. Resected and retrieved.                           - Erythematous mucosa in the rectum, in the sigmoid                            colon, in the descending colon, in the transverse                            colon, in the ascending colon and in the cecum.                            Biopsied.                           -  Scar in the rectum. Biopsied.                           - Non-bleeding internal hemorrhoids. Recommendation:           - Patient has a contact number available for                            emergencies. The signs and symptoms of potential                            delayed complications were discussed with the                            patient. Return to normal activities tomorrow.                            Written discharge instructions were provided to the                            patient.                           - Resume previous diet.                           - Continue present medications.                           - Await pathology results.                           - Repeat colonoscopy in 5 years for surveillance                            based on pathology results.                           - Return to GI office at the next available                            appointment. Please call to schedule appointment. Mauri Pole, MD 05/17/2020 10:31:00 AM This report has been signed electronically.

## 2020-05-17 NOTE — Patient Instructions (Signed)
YOU HAD AN ENDOSCOPIC PROCEDURE TODAY AT [facility]:   Refer to the procedure report that was given to you for any specific questions about what was found during the examination.  If the procedure report does not answer your questions, please call your gastroenterologist to clarify.  If you requested that your care partner not be given the details of your procedure findings, then the procedure report has been included in a sealed envelope for you to review at your convenience later.  YOU SHOULD EXPECT: Some feelings of bloating in the abdomen. Passage of more gas than usual.  Walking can help get rid of the air that was put into your GI tract during the procedure and reduce the bloating. If you had a lower endoscopy (such as a colonoscopy or flexible sigmoidoscopy) you may notice spotting of blood in your stool or on the toilet paper. If you underwent a bowel prep for your procedure, you may not have a normal bowel movement for a few days.  Please Note:  You might notice some irritation and congestion in your nose or some drainage.  This is from the oxygen used during your procedure.  There is no need for concern and it should clear up in a day or so.  SYMPTOMS TO REPORT IMMEDIATELY:   Following lower endoscopy (colonoscopy or flexible sigmoidoscopy):  Excessive amounts of blood in the stool  Significant tenderness or worsening of abdominal pains  Swelling of the abdomen that is new, acute  Fever of 100F or higher   For urgent or emergent issues, a gastroenterologist can be reached at any hour by calling [phone]. Do not use MyChart messaging for urgent concerns.    DIET:  We do recommend a small meal at first, but then you may proceed to your regular diet.  Drink plenty of fluids but you should avoid alcoholic beverages for 24 hours.  MEDICATIONS: Continue present medications.  Please see handouts given to you by your recovery nurse.  FOLLOW UP: Return to Dr.  Woodward Ku office for an appointment at next available. Please call her office to schedule. Repeat colonoscopy in 5 years for surveillance based on pathology results.  Thank you for allowing [place] to provide for your healthcare needs today.  ACTIVITY:  You should plan to take it easy for the rest of today and you should NOT DRIVE or use heavy machinery until tomorrow (because of the sedation medicines used during the test).    FOLLOW UP: Our staff will call the number listed on your records 48-72 hours following your procedure to check on you and address any questions or concerns that you may have regarding the information given to you following your procedure. If we do not reach you, we will leave a message.  We will attempt to reach you two times.  During this call, we will ask if you have developed any symptoms of COVID 19. If you develop any symptoms (ie: fever, flu-like symptoms, shortness of breath, cough etc.) before then, please call [phone].  If you test positive for Covid 19 in the 2 weeks post procedure, please call and report this information to [place].    If any biopsies were taken you will be contacted by phone or by letter within the next 1-3 weeks.  Please call us at [phone] if you have not heard about the biopsies in 3 weeks.    SIGNATURES/CONFIDENTIALITY: You and/or your care partner have signed paperwork which will be entered into your  electronic medical record.  These signatures attest to the fact that that the information above on your After Visit Summary has been reviewed and is understood.  Full responsibility of the confidentiality of this discharge information lies with you and/or your care-partner.

## 2020-05-17 NOTE — Progress Notes (Signed)
Called to room to assist during endoscopic procedure.  Patient ID and intended procedure confirmed with present staff. Received instructions for my participation in the procedure from the performing physician.  

## 2020-05-17 NOTE — Progress Notes (Signed)
Medical history reviewed with no changes noted. VS assessed by C.W 

## 2020-05-19 ENCOUNTER — Telehealth: Payer: Self-pay

## 2020-05-19 NOTE — Telephone Encounter (Signed)
LVM

## 2020-05-27 ENCOUNTER — Telehealth: Payer: Self-pay | Admitting: Gastroenterology

## 2020-05-27 NOTE — Telephone Encounter (Signed)
Left message for patient to call back  

## 2020-05-27 NOTE — Telephone Encounter (Signed)
Patient called said she has not received any follow up call nor voicemail since her procedure. Has questions regarding procedure report.

## 2020-05-31 ENCOUNTER — Other Ambulatory Visit: Payer: Self-pay | Admitting: Gastroenterology

## 2020-05-31 ENCOUNTER — Ambulatory Visit (INDEPENDENT_AMBULATORY_CARE_PROVIDER_SITE_OTHER): Payer: 59 | Admitting: Gastroenterology

## 2020-05-31 ENCOUNTER — Other Ambulatory Visit: Payer: 59

## 2020-05-31 ENCOUNTER — Encounter: Payer: Self-pay | Admitting: Gastroenterology

## 2020-05-31 VITALS — BP 110/62 | HR 81 | Ht 66.0 in | Wt 156.0 lb

## 2020-05-31 DIAGNOSIS — K5180 Other ulcerative colitis without complications: Secondary | ICD-10-CM

## 2020-05-31 MED ORDER — BUDESONIDE ER 9 MG PO CP24: ORAL_CAPSULE | ORAL | 2 refills | Status: DC

## 2020-05-31 NOTE — Progress Notes (Signed)
Alison Fleming    [id number]    [birth year]  Primary Care Physician:Parker, Algis Greenhouse, MD  Referring Physician: Vivi Barrack, MD [address] [place],  [place] [address]   Chief complaint:  Rectal bleeding, colitis  HPI:  45 year old very pleasant female with history of proctitis and intermittent rectal bleeding She was last seen by Anderson Malta in February 2022.  She has been using Canasa suppositories intermittently.  She was initially prescribed after findings of proctitis during colonoscopy in November 2019.  Her symptoms had improved and but she developed recurrent symptoms earlier this year.  She is no longer having rectal bleeding but continues to have semiformed pasty stool with 1-3 bowel movements per day that is extremely foul-smelling with mucus She has intermittent lower abdominal discomfort  Colonoscopy May 17, 2020 for rectal bleeding and change in bowel habits - One 5 mm polyp in the cecum, removed with a cold snare. Resected and retrieved. - Erythematous mucosa in the rectum, in the sigmoid colon, in the descending colon, in the transverse colon, in the ascending colon and in the cecum. Biopsied. - Scar in the rectum. Biopsied. - Non-bleeding internal hemorrhoid  1. Surgical [P], colon, cecum, polyp (1) - SESSILE SERRATED POLYP WITHOUT CYTOLOGIC DYSPLASIA (ONE). 2. Surgical [P], right colon -mild, patchy erythema - FOCAL ACTIVE COLITIS WITH MILD CHRONIC CHANGES. - NO GRANULOMAS OR DYSPLASIA. - SEE MICROSCOPIC DESCRIPTION. 3. Surgical [P], left colon-erythema, proctis - FOCAL ACTIVE COLITIS WITH MINIMAL CHRONIC CHANGES. - NO GRANULOMAS OR DYSPLASIA. - SEE MICROSCOPIC DESCRIPTION.  Colonoscopy November 28, 2017 for change in bowel habits - A single erosion in the terminal ileum. Biopsied. - Patchy mild inflammation was found in the rectum secondary to proctitis. Biopsied. - The entire examined colon is normal. Biopsied. - Non-bleeding internal  hemorrhoids.  1. Surgical [P], terminal ileum - ILEAL MUCOSA WITH NO SPECIFIC HISTOPATHOLOGIC CHANGES. SEE NOTE. 2. Surgical [P], right colon - COLONIC MUCOSA WITH NO SPECIFIC HISTOPATHOLOGIC CHANGES. SEE NOTE. 3. Surgical [P], left colon BX - COLONIC MUCOSA WITH NO SPECIFIC HISTOPATHOLOGIC CHANGES. SEE NOTE. 4. Surgical [P], rectum - RECTAL MUCOSA WITH NO SPECIFIC HISTOPATHOLOGIC CHANGES. SEE NOTE. Diagnosis Note 1. -4. The ileocolonic biopsies are negative for acute inflammation, features of chronicity or granulomas.  EGD March 08, 2017: Normal  Abdominal ultrasound January 03, 2017: Normal  Outpatient Encounter Medications as of 05/31/2020  Medication Sig  . CRANBERRY PO Take by mouth.   . cyanocobalamin 100 MCG tablet Take 100 mcg by mouth every other day.   . cyclobenzaprine (FLEXERIL) 10 MG tablet Take 1 tablet (10 mg total) by mouth at bedtime. (Patient taking differently: Take 10 mg by mouth at bedtime. Takes as needed)  . Norethindrone Acetate-Ethinyl Estradiol (MICROGESTIN) 1.5-30 MG-MCG tablet Take 1 tablet by mouth daily.  [name] omeprazole (PRILOSEC) 40 MG capsule TAKE 1 CAPSULE BY MOUTH EVERY DAY  . Prenatal Vit-Fe Fumarate-FA (MULTIVITAMIN-PRENATAL) 27-0.8 MG TABS Take 1 tablet by mouth daily.  . valACYclovir (VALTREX) 1000 MG tablet TAKE 2 TABLETS X 1 AND REPEAT IN 12 HOURS WITH SYMPTOM ONSET.  [name] VITAMIN D PO Take by mouth.   Facility-Administered Encounter Medications as of 05/31/2020  Medication  . 0.9 %  sodium chloride infusion    Allergies as of 05/31/2020  . (No Known Allergies)    Past Medical History:  Diagnosis Date  . 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 Anxiety   . Atypical chest pain 04/24/2017  . Dyspepsia, s/p  normal EGD 03/08/17, with recommendation for lifetime anti-reflux regimen 03/26/2017  . GAD (generalized anxiety disorder) 05/10/2016  . GERD (gastroesophageal reflux disease)   . History of spinal fusion for scoliosis 03/26/2017  . Hyperlipidemia    . Left upper quadrant pain 03/26/2017  . Malaise and fatigue 03/26/2017  . Paresthesias/numbness 04/24/2017  . Polyarthralgia 03/28/2017  . Positive Lyme disease serology   . Seasonal allergies   . Tension headache 03/26/2017    Past Surgical History:  Procedure Laterality Date  . COLONOSCOPY  2019  . DILATATION & CURRETTAGE/HYSTEROSCOPY WITH RESECTOCOPE  02/12/2012   Procedure: [place];  Surgeon: Azalia Bilis, MD;  Location: [facility];  Service: Gynecology;  Laterality: N/A;  . DILATION AND CURETTAGE OF UTERUS  2010  . DILATION AND CURETTAGE OF UTERUS  2012  . SPINAL FUSION  2008    Family History  Problem Relation Age of Onset  . Diabetes Maternal Grandmother   . Hypertension Maternal Grandmother   . Heart disease Maternal Grandmother   . Heart attack Maternal Grandmother   . Osteoporosis Maternal Grandmother   . Diabetes Maternal Grandfather   . Hypertension Maternal Grandfather   . Heart disease Maternal Grandfather   . Heart attack Maternal Grandfather   . Hypertension Mother   . Anxiety disorder Mother   . Diabetes Mother   . Heart murmur Brother   . Colon cancer Neg Hx   . Esophageal cancer Neg Hx   . Rectal cancer Neg Hx   . Stomach cancer Neg Hx   . Breast cancer Neg Hx   . Colon polyps Neg Hx     Social History   Socioeconomic History  . Marital status: Married    Spouse name: Not on file  . Number of children: 2  . Years of education: Not on file  . Highest education level: Not on file  Occupational History  . Occupation: HUMAN RESOURCES    Employer: [employer]  Tobacco Use  . Smoking status: Never Smoker  . Smokeless tobacco: Never Used  Vaping Use  . Vaping Use: Never used  Substance and Sexual Activity  . Alcohol use: Yes    Alcohol/week: 1.0 - 2.0 standard drink    Types: 1 - 2 Standard drinks or equivalent per week  . Drug use: No  . Sexual activity: Yes    Partners: Male     Birth control/protection: OCP  Other Topics Concern  . Not on file  Social History Narrative  . Not on file   Social Determinants of Health   Financial Resource Strain: Not on file  Food Insecurity: Not on file  Transportation Needs: Not on file  Physical Activity: Not on file  Stress: Not on file  Social Connections: Not on file  Intimate Partner Violence: Not on file      Review of systems: All other review of systems negative except as mentioned in the HPI.   Physical Exam: Vitals:   05/31/20 0757  BP: 110/62  Pulse: 81  SpO2: 99%   Body mass index is 25.18 kg/m. Gen:      No acute distress HEENT:  sclera anicteric Abd:      soft, non-tender; no palpable masses, no distension Ext:    No edema Neuro: alert and oriented x 3 Psych: normal mood and affect  Data Reviewed:  Reviewed labs, radiology imaging, old records and pertinent past GI work up   Assessment and Plan/Recommendations:  45 year old very  pleasant female with findings of left-sided colitis, biopsies showed findings of focal active colitis with chronic inflammation concerning for ulcerative colitis No recent GI pathogen panel, will obtain it to exclude any acute GI infection If GI pathogen panel negative, start budesonide 9 mg daily for 2 months followed by taper to 6 mg daily for 2 weeks and 3 mg daily for additional 4 weeks Will plan to start mesalamine in 2 months for long-term therapy and to prevent any acute flares of UC  Continue with high-fiber diet and increase water intake Plan for repeat CBC, CMP and CRP in 2 months Return for office follow-up visit in 2 months  This visit required 40 minutes of patient care (this includes precharting, chart review, review of results, face-to-face time used for counseling as well as treatment plan and follow-up. The patient was provided an opportunity to ask questions and all were answered. The patient agreed with the plan and demonstrated an understanding  of the instructions.  Damaris Hippo , MD    CC: Vivi Barrack, MD

## 2020-05-31 NOTE — Patient Instructions (Addendum)
Your provider has requested that you go to the basement level for lab work before leaving today. Press "B" on the elevator. The lab is located at the first door on the left as you exit the elevator. ( Stool Kit)   Budesonide taper: Take 9 mg daily for 2 months Then decrease to 6 mg for 2 weeks then decrease to 3 mg for 4 weeks  Due to recent changes in healthcare laws, you may see the results of your imaging and laboratory studies on MyChart before your provider has had a chance to review them.  We understand that in some cases there may be results that are confusing or concerning to you. Not all laboratory results come back in the same time frame and the provider may be waiting for multiple results in order to interpret others.  Please give [place] 48 hours in order for your provider to thoroughly review all the results before contacting the office for clarification of your results.   I appreciate the  opportunity to care for you  Thank You   Harl Bowie , MD

## 2020-05-31 NOTE — Addendum Note (Signed)
Addended by: Oda Kilts on: 05/31/2020 09:11 AM   Modules accepted: Orders

## 2020-06-04 ENCOUNTER — Encounter: Payer: Self-pay | Admitting: Gastroenterology

## 2020-06-04 ENCOUNTER — Other Ambulatory Visit: Payer: 59

## 2020-06-04 DIAGNOSIS — K5180 Other ulcerative colitis without complications: Secondary | ICD-10-CM

## 2020-06-07 LAB — GI PROFILE, STOOL, PCR

## 2020-06-09 ENCOUNTER — Other Ambulatory Visit: Payer: Self-pay | Admitting: *Deleted

## 2020-06-09 DIAGNOSIS — K5180 Other ulcerative colitis without complications: Secondary | ICD-10-CM

## 2020-06-09 MED ORDER — BUDESONIDE 3 MG PO CPEP: ORAL_CAPSULE | ORAL | 0 refills | Status: DC

## 2020-06-22 ENCOUNTER — Ambulatory Visit: Payer: 59 | Admitting: Physician Assistant

## 2020-06-24 ENCOUNTER — Encounter (HOSPITAL_BASED_OUTPATIENT_CLINIC_OR_DEPARTMENT_OTHER): Payer: Self-pay

## 2020-06-25 ENCOUNTER — Other Ambulatory Visit (HOSPITAL_BASED_OUTPATIENT_CLINIC_OR_DEPARTMENT_OTHER): Payer: Self-pay | Admitting: Obstetrics & Gynecology

## 2020-06-25 ENCOUNTER — Telehealth: Payer: Self-pay | Admitting: *Deleted

## 2020-06-25 ENCOUNTER — Encounter (HOSPITAL_BASED_OUTPATIENT_CLINIC_OR_DEPARTMENT_OTHER): Payer: Self-pay | Admitting: *Deleted

## 2020-06-25 MED ORDER — MICROGESTIN 1/20 1-20 MG-MCG PO TABS: 1.0000 | ORAL_TABLET | Freq: Every day | ORAL | 3 refills | Status: DC

## 2020-06-25 NOTE — Telephone Encounter (Signed)
Call to patient. Surgery date options discussed. Patient desires afternoon on 6-28. ( so that will not need to arrive so early) Advised will schedule and call her back once confirmed.

## 2020-06-25 NOTE — Telephone Encounter (Signed)
Call to patient. Advised surgery date confirmed- 07-13-20 at 1400 at [facility] 12, noon. Advised will receive a call from hospital pre-op nurse and letter confirmation in mail. Encounter closed.

## 2020-07-01 ENCOUNTER — Ambulatory Visit: Payer: 59 | Admitting: Physician Assistant

## 2020-07-05 ENCOUNTER — Telehealth: Payer: Self-pay | Admitting: *Deleted

## 2020-07-05 NOTE — Telephone Encounter (Signed)
Voice mail message received over weekend regarding pre-op Covid testing. Requested call back for clarification.  Return call to patient. She received letter with Covid testing information. Advised that is included just in case needed. Advised pre-op nurse will determine need for testing.  Encounter closed.

## 2020-07-08 ENCOUNTER — Ambulatory Visit: Payer: 59

## 2020-07-09 ENCOUNTER — Encounter (HOSPITAL_COMMUNITY): Payer: Self-pay | Admitting: Obstetrics & Gynecology

## 2020-07-12 ENCOUNTER — Other Ambulatory Visit: Payer: Self-pay

## 2020-07-12 ENCOUNTER — Encounter (HOSPITAL_COMMUNITY): Payer: Self-pay | Admitting: Obstetrics & Gynecology

## 2020-07-12 NOTE — Progress Notes (Signed)
DUE TO COVID-19 ONLY ONE VISITOR IS ALLOWED TO COME WITH YOU AND STAY IN THE WAITING ROOM ONLY DURING PRE OP AND PROCEDURE DAY OF SURGERY.   PCP - Dr Dimas Chyle Cardiologist - n/a OB/GYN - Dr Edwinna Areola  Chest x-ray - n/a EKG - n/a Stress Test - n/a ECHO - 05/24/17 Cardiac Cath - n/a  ERAS: Clear liquids til 11:30 am  Anesthesia review: Yes  STOP now taking any Aspirin (unless otherwise instructed by your surgeon), Aleve, Naproxen, Ibuprofen, Motrin, Advil, Goody's, BC's, all herbal medications, fish oil, and all vitamins.   Coronavirus Screening Covid test n/a - Ambulatory Surgery  Do you have any of the following symptoms:  Cough yes/no: No Fever (>100.41F)  yes/no: No Runny nose yes/no: No Sore throat yes/no: No Difficulty breathing/shortness of breath  yes/no: No  Have you traveled in the last 14 days and where? yes/no: No  Patient verbalized understanding of instructions that were given via phone.

## 2020-07-13 ENCOUNTER — Encounter (HOSPITAL_COMMUNITY): Payer: Self-pay | Admitting: Obstetrics & Gynecology

## 2020-07-13 ENCOUNTER — Other Ambulatory Visit (HOSPITAL_BASED_OUTPATIENT_CLINIC_OR_DEPARTMENT_OTHER): Payer: Self-pay | Admitting: Obstetrics & Gynecology

## 2020-07-13 ENCOUNTER — Ambulatory Visit (HOSPITAL_COMMUNITY)
Admission: RE | Admit: 2020-07-13 | Discharge: 2020-07-13 | Disposition: A | Discharge status: Home / Self Care | Payer: 59 | Attending: Obstetrics & Gynecology | Admitting: Obstetrics & Gynecology

## 2020-07-13 ENCOUNTER — Ambulatory Visit (HOSPITAL_COMMUNITY): Payer: 59 | Admitting: Emergency Medicine

## 2020-07-13 ENCOUNTER — Encounter (HOSPITAL_COMMUNITY)
Admission: RE | Disposition: A | Discharge status: Home / Self Care | Payer: Self-pay | Source: Home / Self Care | Attending: Obstetrics & Gynecology

## 2020-07-13 ENCOUNTER — Other Ambulatory Visit: Payer: Self-pay

## 2020-07-13 DIAGNOSIS — Z793 Long term (current) use of hormonal contraceptives: Secondary | ICD-10-CM | POA: Insufficient documentation

## 2020-07-13 DIAGNOSIS — Z981 Arthrodesis status: Secondary | ICD-10-CM | POA: Diagnosis not present

## 2020-07-13 DIAGNOSIS — N840 Polyp of corpus uteri: Secondary | ICD-10-CM

## 2020-07-13 DIAGNOSIS — G44209 Tension-type headache, unspecified, not intractable: Secondary | ICD-10-CM

## 2020-07-13 DIAGNOSIS — Z79899 Other long term (current) drug therapy: Secondary | ICD-10-CM | POA: Diagnosis not present

## 2020-07-13 DIAGNOSIS — N930 Postcoital and contact bleeding: Secondary | ICD-10-CM | POA: Diagnosis not present

## 2020-07-13 DIAGNOSIS — N926 Irregular menstruation, unspecified: Secondary | ICD-10-CM | POA: Diagnosis not present

## 2020-07-13 HISTORY — DX: Herpesviral infection, unspecified: B00.9

## 2020-07-13 HISTORY — PX: DILATATION & CURETTAGE/HYSTEROSCOPY WITH MYOSURE: SHX6511

## 2020-07-13 HISTORY — DX: Angina pectoris, unspecified: I20.9

## 2020-07-13 LAB — CBC
HCT: 38.4 % (ref 36.0–46.0)
Hemoglobin: 13.2 g/dL (ref 12.0–15.0)
MCH: 31.2 pg (ref 26.0–34.0)
MCHC: 34.4 g/dL (ref 30.0–36.0)
MCV: 90.8 fL (ref 80.0–100.0)
Platelets: 280 10*3/uL (ref 150–400)
RBC: 4.23 MIL/uL (ref 3.87–5.11)
RDW: 12.3 % (ref 11.5–15.5)
WBC: 7.5 10*3/uL (ref 4.0–10.5)
nRBC: 0 % (ref 0.0–0.2)

## 2020-07-13 LAB — POCT PREGNANCY, URINE: Preg Test, Ur: NEGATIVE

## 2020-07-13 SURGERY — DILATATION & CURETTAGE/HYSTEROSCOPY WITH MYOSURE
Anesthesia: General | Site: Uterus

## 2020-07-13 MED ORDER — FENTANYL CITRATE (PF) 250 MCG/5ML IJ SOLN
INTRAMUSCULAR | Status: AC
  Filled 2020-07-13: qty 5

## 2020-07-13 MED ORDER — OXYCODONE HCL 5 MG PO TABS: 5.0000 mg | ORAL_TABLET | Freq: Once | ORAL | Status: DC | PRN

## 2020-07-13 MED ORDER — IBUPROFEN 200 MG PO TABS: 800.0000 mg | ORAL_TABLET | Freq: Three times a day (TID) | ORAL | 0 refills | Status: DC | PRN

## 2020-07-13 MED ORDER — ONDANSETRON HCL 4 MG/2ML IJ SOLN
INTRAMUSCULAR | Status: DC | PRN
  Administered 2020-07-13: 4 mg via INTRAVENOUS

## 2020-07-13 MED ORDER — PROMETHAZINE HCL 25 MG/ML IJ SOLN: 6.2500 mg | INTRAMUSCULAR | Status: DC | PRN

## 2020-07-13 MED ORDER — LIDOCAINE 2% (20 MG/ML) 5 ML SYRINGE
INTRAMUSCULAR | Status: AC
  Filled 2020-07-13: qty 10

## 2020-07-13 MED ORDER — ORAL CARE MOUTH RINSE: 15.0000 mL | Freq: Once | OROMUCOSAL | Status: AC

## 2020-07-13 MED ORDER — LIDOCAINE HCL (CARDIAC) PF 100 MG/5ML IV SOSY
PREFILLED_SYRINGE | INTRAVENOUS | Status: DC | PRN
  Administered 2020-07-13: 100 mg via INTRATRACHEAL

## 2020-07-13 MED ORDER — SODIUM CHLORIDE 0.9 % IR SOLN
Status: DC | PRN
  Administered 2020-07-13: 3000 mL

## 2020-07-13 MED ORDER — ONDANSETRON HCL 4 MG/2ML IJ SOLN
INTRAMUSCULAR | Status: AC
  Filled 2020-07-13: qty 2

## 2020-07-13 MED ORDER — OMEPRAZOLE 40 MG PO CPDR: 40.0000 mg | DELAYED_RELEASE_CAPSULE | Freq: Every day | ORAL | Status: DC

## 2020-07-13 MED ORDER — SILVER NITRATE-POT NITRATE 75-25 % EX MISC
CUTANEOUS | Status: DC | PRN
  Administered 2020-07-13: 8

## 2020-07-13 MED ORDER — DIPHENHYDRAMINE HCL 50 MG/ML IJ SOLN
INTRAMUSCULAR | Status: AC
  Filled 2020-07-13: qty 1

## 2020-07-13 MED ORDER — DEXAMETHASONE SODIUM PHOSPHATE 10 MG/ML IJ SOLN
INTRAMUSCULAR | Status: DC | PRN
  Administered 2020-07-13: 10 mg via INTRAVENOUS

## 2020-07-13 MED ORDER — PROPOFOL 10 MG/ML IV BOLUS
INTRAVENOUS | Status: AC
  Filled 2020-07-13: qty 20

## 2020-07-13 MED ORDER — FENTANYL CITRATE (PF) 250 MCG/5ML IJ SOLN
INTRAMUSCULAR | Status: DC | PRN
  Administered 2020-07-13: 100 ug via INTRAVENOUS

## 2020-07-13 MED ORDER — LIDOCAINE-EPINEPHRINE 1 %-1:100000 IJ SOLN
INTRAMUSCULAR | Status: AC
  Filled 2020-07-13: qty 1

## 2020-07-13 MED ORDER — POVIDONE-IODINE 10 % EX SWAB
2.0000 "application " | Freq: Once | CUTANEOUS | Status: AC
  Administered 2020-07-13: 2 via TOPICAL

## 2020-07-13 MED ORDER — MIDAZOLAM HCL 2 MG/2ML IJ SOLN
INTRAMUSCULAR | Status: DC | PRN
  Administered 2020-07-13: 2 mg via INTRAVENOUS

## 2020-07-13 MED ORDER — HYDROMORPHONE HCL 1 MG/ML IJ SOLN: 0.2500 mg | INTRAMUSCULAR | Status: DC | PRN

## 2020-07-13 MED ORDER — LIDOCAINE-EPINEPHRINE 1 %-1:100000 IJ SOLN
INTRAMUSCULAR | Status: DC | PRN
  Administered 2020-07-13: 20 mL

## 2020-07-13 MED ORDER — DIPHENHYDRAMINE HCL 50 MG/ML IJ SOLN
INTRAMUSCULAR | Status: DC | PRN
  Administered 2020-07-13: 12.5 mg via INTRAVENOUS

## 2020-07-13 MED ORDER — KETOROLAC TROMETHAMINE 30 MG/ML IJ SOLN
INTRAMUSCULAR | Status: AC
  Filled 2020-07-13: qty 1

## 2020-07-13 MED ORDER — CYCLOBENZAPRINE HCL 10 MG PO TABS: 10.0000 mg | ORAL_TABLET | Freq: Three times a day (TID) | ORAL | Status: DC | PRN

## 2020-07-13 MED ORDER — MEPERIDINE HCL 25 MG/ML IJ SOLN
INTRAMUSCULAR | Status: AC
  Filled 2020-07-13: qty 1

## 2020-07-13 MED ORDER — HYDROCODONE-ACETAMINOPHEN 5-325 MG PO TABS: 1.0000 | ORAL_TABLET | Freq: Four times a day (QID) | ORAL | 0 refills | Status: DC | PRN

## 2020-07-13 MED ORDER — MEPERIDINE HCL 25 MG/ML IJ SOLN
6.2500 mg | INTRAMUSCULAR | Status: DC | PRN
  Administered 2020-07-13: 12.5 mg via INTRAVENOUS

## 2020-07-13 MED ORDER — LACTATED RINGERS IV SOLN: INTRAVENOUS | Status: DC

## 2020-07-13 MED ORDER — CHLORHEXIDINE GLUCONATE 0.12 % MT SOLN
15.0000 mL | Freq: Once | OROMUCOSAL | Status: AC
  Administered 2020-07-13: 15 mL via OROMUCOSAL
  Filled 2020-07-13: qty 15

## 2020-07-13 MED ORDER — DEXAMETHASONE SODIUM PHOSPHATE 10 MG/ML IJ SOLN
INTRAMUSCULAR | Status: AC
  Filled 2020-07-13: qty 1

## 2020-07-13 MED ORDER — KETAMINE HCL 50 MG/5ML IJ SOSY
PREFILLED_SYRINGE | INTRAMUSCULAR | Status: AC
  Filled 2020-07-13: qty 5

## 2020-07-13 MED ORDER — KETAMINE HCL 10 MG/ML IJ SOLN
INTRAMUSCULAR | Status: DC | PRN
  Administered 2020-07-13: 20 mg via INTRAVENOUS

## 2020-07-13 MED ORDER — MIDAZOLAM HCL 2 MG/2ML IJ SOLN
INTRAMUSCULAR | Status: AC
  Filled 2020-07-13: qty 2

## 2020-07-13 MED ORDER — OXYCODONE HCL 5 MG/5ML PO SOLN: 5.0000 mg | Freq: Once | ORAL | Status: DC | PRN

## 2020-07-13 MED ORDER — PROPOFOL 10 MG/ML IV BOLUS
INTRAVENOUS | Status: DC | PRN
  Administered 2020-07-13: 150 mg via INTRAVENOUS

## 2020-07-13 SURGICAL SUPPLY — 16 items
CANISTER SUCT 3000ML PPV (MISCELLANEOUS) ×3 IMPLANT
CATH ROBINSON RED A/P 16FR (CATHETERS) ×3 IMPLANT
DEVICE MYOSURE LITE (MISCELLANEOUS) ×3 IMPLANT
DEVICE MYOSURE REACH (MISCELLANEOUS) IMPLANT
DILATOR CANAL MILEX (MISCELLANEOUS) ×3 IMPLANT
GLOVE SURG LTX SZ6.5 (GLOVE) ×3 IMPLANT
GLOVE SURG UNDER POLY LF SZ7 (GLOVE) ×6 IMPLANT
GOWN STRL REUS W/ TWL LRG LVL3 (GOWN DISPOSABLE) ×2 IMPLANT
GOWN STRL REUS W/TWL LRG LVL3 (GOWN DISPOSABLE) ×4
KIT PROCEDURE FLUENT (KITS) ×3 IMPLANT
KIT TURNOVER KIT B (KITS) ×3 IMPLANT
PACK VAGINAL MINOR WOMEN LF (CUSTOM PROCEDURE TRAY) ×3 IMPLANT
PAD OB MATERNITY 4.3X12.25 (PERSONAL CARE ITEMS) ×3 IMPLANT
SEAL ROD LENS SCOPE MYOSURE (ABLATOR) ×3 IMPLANT
TOWEL GREEN STERILE FF (TOWEL DISPOSABLE) ×6 IMPLANT
UNDERPAD 30X36 HEAVY ABSORB (UNDERPADS AND DIAPERS) ×3 IMPLANT

## 2020-07-13 NOTE — Anesthesia Preprocedure Evaluation (Signed)
Anesthesia Evaluation  Patient identified by MRN, date of birth, ID band Patient awake    Reviewed: Allergy & Precautions, H&P , NPO status , Patient's Chart, lab work & pertinent test results  Airway Mallampati: I  TM Distance: >3 FB Neck ROM: Full    Dental  (+) Dental Advisory Given   Pulmonary neg pulmonary ROS,    Pulmonary exam normal breath sounds clear to auscultation       Cardiovascular negative cardio ROS Normal cardiovascular exam Rhythm:Regular Rate:Normal     Neuro/Psych  Headaches, PSYCHIATRIC DISORDERS Anxiety    GI/Hepatic Neg liver ROS, GERD  ,  Endo/Other  negative endocrine ROS  Renal/GU negative Renal ROS  negative genitourinary   Musculoskeletal Hx/o Scoliosis ThoracoLumbar S/P Spinal Fusion with Rods   Abdominal   Peds  Hematology negative hematology ROS (+)   Anesthesia Other Findings   Reproductive/Obstetrics Menorrhagia Endometrial polyp Uterine Synechiae                            Anesthesia Physical  Anesthesia Plan  ASA: 2  Anesthesia Plan: General   Post-op Pain Management:    Induction: Intravenous  PONV Risk Score and Plan: 4 or greater and Ondansetron, Dexamethasone, Treatment may vary due to age or medical condition, Midazolam and Diphenhydramine  Airway Management Planned: LMA  Additional Equipment: None  Intra-op Plan:   Post-operative Plan: Extubation in OR  Informed Consent: I have reviewed the patients History and Physical, chart, labs and discussed the procedure including the risks, benefits and alternatives for the proposed anesthesia with the patient or authorized representative who has indicated his/her understanding and acceptance.     Dental advisory given  Plan Discussed with: CRNA  Anesthesia Plan Comments:        Anesthesia Quick Evaluation

## 2020-07-13 NOTE — Anesthesia Postprocedure Evaluation (Signed)
Anesthesia Post Note  Patient: Alison Fleming  Procedure(s) Performed: DILATATION & CURETTAGE/HYSTEROSCOPY WITH MYOSURE (Uterus)     Patient location during evaluation: PACU Anesthesia Type: General Level of consciousness: sedated and patient cooperative Pain management: pain level controlled Vital Signs Assessment: post-procedure vital signs reviewed and stable Respiratory status: spontaneous breathing Cardiovascular status: stable Anesthetic complications: no   No notable events documented.  Last Vitals:  Vitals:   07/13/20 1518 07/13/20 1532  BP: 114/73 113/74  Pulse: 74 76  Resp: 16 15  Temp:  36.8 C  SpO2: 99% 99%    Last Pain:  Vitals:   07/13/20 1532  TempSrc:   PainSc: 0-No pain                 Nolon Nations

## 2020-07-13 NOTE — H&P (Signed)
Alison Fleming is an 45 y.o. female Married White female here for hysteroscopy, possible polyp resection, D&C due to break through bleeding on OCPs and post coital spotting.  She underwent an ultrasound in 02/2019 with about 68m endometrium.  Endometrial biopsy 10/2019 showed an endometrial polyp noted.  We discussed hysteroscopy with polyp resection at that time.  Pt declined but has decided to proceed at this time.  Risks discussed including but not limited to bleeding, rare risk of transfusion, infection, 1% risk of uterine perforation with risks of fluid deficit causing cardiac arrythmia, cerebral swelling and/or need to stop procedure early.  Fluid emboli and rare risk of death discussed.  DVT/PE, rare risk of risk of bowel/bladder/ureteral/vascular injury.  Patient aware if pathology abnormal she may need additional treatment.  All questions answered.    Pertinent Gynecological History: Menses: flow is moderate Bleeding: regular but with intermittent spotting and post coital bleeding Contraception: OCP (estrogen/progesterone) DES exposure: denies Blood transfusions:  yes but it was her own blood Sexually transmitted diseases: no past history Previous GYN Procedures: D&C post delivery of both children, 2014 hysteroscopy Last mammogram: normal Date: 02/2020 Last pap: normal Date: 04/2020 OB History: G3, P2   Menstrual History: Patient's last menstrual period was 07/06/2020.    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   Anxiety    Atypical chest pain 04/24/2017   Dyspepsia, s/p normal EGD 03/08/17, with recommendation for lifetime anti-reflux regimen 03/26/2017   GAD (generalized anxiety disorder) 05/10/2016   GERD (gastroesophageal reflux disease)    History of spinal fusion for scoliosis 03/26/2017   HSV infection    Hyperlipidemia    Left upper quadrant pain 03/26/2017   Malaise and fatigue 03/26/2017   Paresthesias/numbness 04/24/2017    Polyarthralgia 03/28/2017   Positive Lyme disease serology    Seasonal allergies    Tension headache 03/26/2017    Past Surgical History:  Procedure Laterality Date   COLONOSCOPY  2019   DILATATION & CURRETTAGE/HYSTEROSCOPY WITH RESECTOCOPE  02/12/2012   [place]  Surgeon: TAzalia Bilis MD;  [facility];  Service: Gynecology;  Laterality: N/A;   DILATION AND CURETTAGE OF UTERUS  2010   DILATION AND CURETTAGE OF UTERUS  2012   SPINAL FUSION  2008    Family History  Problem Relation Age of Onset   Diabetes Maternal Grandmother    Hypertension Maternal Grandmother    Heart disease Maternal Grandmother    Heart attack Maternal Grandmother    Osteoporosis Maternal Grandmother    Diabetes Maternal Grandfather    Hypertension Maternal Grandfather    Heart disease Maternal Grandfather    Heart attack Maternal Grandfather    Hypertension Mother    Anxiety disorder Mother    Diabetes Mother    Heart murmur Brother    Colon cancer Neg Hx    Esophageal cancer Neg Hx    Rectal cancer Neg Hx    Stomach cancer Neg Hx    Breast cancer Neg Hx    Colon polyps Neg Hx     Social History:  reports that she has never smoked. She has never used smokeless tobacco. She reports current alcohol use of about 5.0 - 6.0 standard drinks of alcohol per week. She reports that she does not use drugs.  Allergies: No Known Allergies  Facility-Administered Medications Prior to Admission  Medication Dose Route Frequency Provider Last Rate Last Admin   0.9 %  sodium chloride infusion  500 mL Intravenous Once Mauri Pole, MD       Medications Prior to Admission  Medication Sig Dispense Refill Last Dose   COLLAGEN PO Take 2,000 mg by mouth daily.   07/12/2020   CRANBERRY PO Take 1 capsule by mouth once a week.   Past Week   cyclobenzaprine (FLEXERIL) 10 MG tablet Take 1 tablet (10 mg total) by mouth at bedtime. (Patient taking  differently: Take 10 mg by mouth 3 (three) times daily as needed for muscle spasms.) 90 tablet 2 Past Week   ibuprofen (ADVIL) 200 MG tablet Take 400 mg by mouth every 6 (six) hours as needed for moderate pain.   Past Week   MICROGESTIN 1-20 MG-MCG tablet Take 1 tablet by mouth daily. 84 tablet 3 07/13/2020 at 0730   Multiple Vitamins-Minerals (MULTIVITAMIN GUMMIES WOMENS PO) Take 2 tablets by mouth daily.   07/12/2020   omeprazole (PRILOSEC) 40 MG capsule TAKE 1 CAPSULE BY MOUTH EVERY DAY (Patient taking differently: Take 40 mg by mouth daily.) 30 capsule 2 07/12/2020   ORTIKOS 9 MG CP24 Take 9 mg by mouth daily.   07/13/2020 at 0730   valACYclovir (VALTREX) 1000 MG tablet TAKE 2 TABLETS X 1 AND REPEAT IN 12 HOURS WITH SYMPTOM ONSET. (Patient taking differently: Take 1,000 mg by mouth daily as needed (cold sores).) 30 tablet 3 Past Month   budesonide (ENTOCORT EC) 3 MG 24 hr capsule Take 9 mg for 2 months then decrease to 6 mg for 2 weeks then decrease to 3 mg for 4 weeks (Patient not taking: Reported on 07/12/2020) 270 capsule 0 Not Taking    Review of Systems  All other systems reviewed and are negative.  Blood pressure 121/71, pulse 87, temperature 98.4 F (36.9 C), temperature source Oral, resp. rate 18, height 5' 6"  (1.676 m), weight 69.4 kg, last menstrual period 07/06/2020, SpO2 98 %. Physical Exam Constitutional:      Appearance: Normal appearance.  Cardiovascular:     Rate and Rhythm: Normal rate and regular rhythm.  Pulmonary:     Effort: Pulmonary effort is normal.     Breath sounds: Normal breath sounds.  Neurological:     General: No focal deficit present.     Mental Status: She is alert.  Psychiatric:        Mood and Affect: Mood normal.    Results for orders placed or performed during the hospital encounter of 07/13/20 (from the past 24 hour(s))  CBC     Status: None   Collection Time: 07/13/20 12:17 PM  Result Value Ref Range   WBC 7.5 4.0 - 10.5 K/uL   RBC 4.23 3.87 -  5.11 MIL/uL   Hemoglobin 13.2 12.0 - 15.0 g/dL   HCT 38.4 36.0 - 46.0 %   MCV 90.8 80.0 - 100.0 fL   MCH 31.2 26.0 - 34.0 pg   MCHC 34.4 30.0 - 36.0 g/dL   RDW 12.3 11.5 - 15.5 %   Platelets 280 150 - 400 K/uL   nRBC 0.0 0.0 - 0.2 %  Pregnancy, urine POC     Status: None   Collection Time: 07/13/20 12:52 PM  Result Value Ref Range   Preg Test, Ur NEGATIVE NEGATIVE    No results found.  69  G3P2 MWF with break through bleeding that has been present for years as well as some post coital bleeding here for hysteroscopy, possible polyp resection, D&C.  Questions answered.  She is ready to proceed.  Stanton Kidney  Ammie Ferrier 07/13/2020, 1:31 PM

## 2020-07-13 NOTE — Op Note (Signed)
07/13/2020  2:46 PM  PATIENT:  Alison Fleming  45 y.o. female  PRE-OPERATIVE DIAGNOSIS:  BTB, endometrial polyp on endometrial biopsy  POST-OPERATIVE DIAGNOSIS:  BTB, polypoid appearing area of endometrium, probable synechia   PROCEDURE:  Procedure(s): DILATATION & CURETTAGE/HYSTEROSCOPY WITH MYOSURE  SURGEON:  Megan Salon  ASSISTANTS: OR staff.   ANESTHESIA:   general  ESTIMATED BLOOD LOSS: 20 mL  BLOOD ADMINISTERED:none   FLUIDS: 1000cc LR  UOP: pt voided right before going back to OR  SPECIMEN:  endometrial curetting, endocervical curetting  DISPOSITION OF SPECIMEN:  PATHOLOGY  FINDINGS: area of probable synechia and polypoid appearing endometrium  DESCRIPTION OF OPERATION: Patient was taken to the operating room.  She is placed in the supine position. SCDs were on her lower extremities and functioning properly. General anesthesia with an LMA was administered without difficulty. Dr. Lissa Hoard, anesthesia, oversaw case.  Legs were then placed in the [place] in the low lithotomy position. The legs were lifted to the high lithotomy position and the Betadine prep was used on the inner thighs perineum and vagina x3. Patient was draped in a normal standard fashion.  A bivalve speculum was placed the vagina. The anterior lip of the cervix was grasped with single-tooth tenaculum.  A paracervical block of 1% lidocaine mixed one-to-one with epinephrine (1:100,000 units).  10 cc was used total. The cervix is dilated up to #21 [facility] dilators. The endometrial cavity sounded to 8 cm.   A Myosure hysteroscope was obtained. Normal saline was used as a hysteroscopic fluid. The hysteroscope was advanced through the endocervical canal into the endometrial cavity. The tubal ostia were noted bilaterally. There was polypoid appearing endometrium present.  I was unsure if this was a polyp.  As well there was symmetric dangling tissue anteriorly and posteriorly that was consistent with  synechia.  Using the myosure light, the polypoid tissue and dangling tissue was removed.  The hysteroscop was removed and a #1 toothed curette was used to curette the cavity until rough gritty texture was noted in all quadrants. Then the endocervical canal was also curetted for an endocervical specimen.  At this point no other procedure was needed and this procedure was ended. The hysteroscope was removed. The fluid deficit was 145cc. The tenaculum was removed from the anterior lip of the cervix. There was bleeding at the tenaculum sites that was made hemostatic with silver nitrate sticks.  The speculum was removed from the vagina. The prep was cleansed of the patient's skin. The legs are positioned back in the supine position. Sponge, lap, needle, initially counts were correct x2. Patient was taken to recovery in stable condition.  COUNTS:  YES  PLAN OF CARE: Transfer to PACU

## 2020-07-13 NOTE — Transfer of Care (Signed)
Immediate Anesthesia Transfer of Care Note  Patient: Alfonse Alpers  Procedure(s) Performed: DILATATION & CURETTAGE/HYSTEROSCOPY WITH MYOSURE (Uterus)  Patient Location: PACU  Anesthesia Type:General  Level of Consciousness: awake, alert  and oriented  Airway & Oxygen Therapy: Patient Spontanous Breathing and Patient connected to face mask oxygen  Post-op Assessment: Report given to RN, Post -op Vital signs reviewed and stable and Patient moving all extremities  Post vital signs: Reviewed and stable  Last Vitals:  Vitals Value Taken Time  BP    Temp    Pulse 94 07/13/20 1450  Resp 17 07/13/20 1450  SpO2 100 % 07/13/20 1450  Vitals shown include unvalidated device data.  Last Pain:  Vitals:   07/13/20 1244  TempSrc:   PainSc: 0-No pain      Patients Stated Pain Goal: 1 (35/43/01 4840)  Complications: No notable events documented.

## 2020-07-13 NOTE — Anesthesia Procedure Notes (Signed)
Procedure Name: LMA Insertion Date/Time: 07/13/2020 2:01 PM Performed by: Leonor Liv, CRNA Pre-anesthesia Checklist: Patient identified, Emergency Drugs available, Suction available and Patient being monitored Patient Re-evaluated:Patient Re-evaluated prior to induction Oxygen Delivery Method: Circle System Utilized Preoxygenation: Pre-oxygenation with 100% oxygen Induction Type: IV induction Ventilation: Mask ventilation without difficulty LMA: LMA inserted LMA Size: 4.0 Number of attempts: 1 Placement Confirmation: positive ETCO2 Tube secured with: Tape Dental Injury: Teeth and Oropharynx as per pre-operative assessment

## 2020-07-14 ENCOUNTER — Encounter (HOSPITAL_COMMUNITY): Payer: Self-pay | Admitting: Obstetrics & Gynecology

## 2020-07-14 LAB — SURGICAL PATHOLOGY

## 2020-07-15 ENCOUNTER — Telehealth (HOSPITAL_BASED_OUTPATIENT_CLINIC_OR_DEPARTMENT_OTHER): Payer: Self-pay | Admitting: Obstetrics & Gynecology

## 2020-07-15 NOTE — Telephone Encounter (Signed)
Called patient and left a message to called the office to set up post op appt in about 2-3 months with Dr.Miller.

## 2020-07-22 ENCOUNTER — Encounter: Payer: Self-pay | Admitting: Physician Assistant

## 2020-07-22 ENCOUNTER — Ambulatory Visit (INDEPENDENT_AMBULATORY_CARE_PROVIDER_SITE_OTHER): Payer: 59 | Admitting: Physician Assistant

## 2020-07-22 VITALS — BP 94/62 | HR 91 | Ht 66.0 in | Wt 155.0 lb

## 2020-07-22 DIAGNOSIS — K5180 Other ulcerative colitis without complications: Secondary | ICD-10-CM | POA: Insufficient documentation

## 2020-07-22 DIAGNOSIS — R194 Change in bowel habit: Secondary | ICD-10-CM

## 2020-07-22 MED ORDER — ORTIKOS 9 MG PO CP24: 9.0000 mg | ORAL_CAPSULE | Freq: Every day | ORAL | 0 refills | Status: DC

## 2020-07-22 NOTE — Patient Instructions (Addendum)
If you are age 44 or younger, your body mass index should be between 19-25. Your Body mass index is 25.02 kg/m. If this is out of the aformentioned range listed, please consider follow up with your Primary Care Provider.  __________________________________________________________  [facility] providers would like to encourage you to use [facility] to communicate with providers for non-urgent requests or questions.  Due to long hold times on the telephone, sending your provider a message by [facility] may be a faster and more efficient way to get a response.  Please allow 48 business hours for a response.  Please remember that this is for non-urgent requests.   Continue Budesonide (Ortikos) 9 mg. We have sent a refill into your pharmacy.  Keep your follow up appointment with Dr. Silverio Decamp on September 12, 2020.  Thank you for entrusting me with your care and choosing [facility].  Ellouise Newer, PA-C

## 2020-07-22 NOTE — Progress Notes (Signed)
Chief Complaint: Follow-up for change in bowel habits, ulcerative colitis  HPI:    [name] is a 45 year old female with past medical history as listed below including GAD, GERD and multiple others, known to Dr. Silverio Decamp, who returns to clinic today for follow-up of her change in bowel habits and ulcerative colitis.     05/31/2020 office visit with Dr. Silverio Decamp for rectal bleeding and colitis.  At that time continued with semiformed pasty stools 1-3 bowel movements per day that was extremely foul-smelling with mucus and intermittent lower abdominal discomfort.  She had a colonoscopy 05/17/2020 for rectal bleeding change in bowel habits with a 5 mm polyp in the cecum found to be sessile serrated, erythematous mucosa in the rectum, sigmoid colon, descending colon, transverse colon and ascending colon as well as a scar in the rectum and nonbleeding internal hemorrhoids.  Biopsy showed focal active colitis with mild chronic changes.  At that time patient had a GI pathogen panel which returned negative.  She is started on budesonide 9 mg daily for 2 months followed by taper to 6 mg for 2 weeks and 3 mg for additional 4 weeks.  Is recommended to start mesalamine in 2 months for long-term therapy and to prevent any acute flares of UC.    Today, the patient explains that she continues with the same symptoms of semiformed pasty stools, she thought she was doing better for a little while, but now she is back to the same "smelly/messy" stool as before which can be "mushy or soft solid or liquid".  Tells me that she also has occasional lower abdominal discomfort.  This is all very similar to when she started with the symptoms.  Has a lot of questions regarding ulcerative colitis.    Denies fever, chills, weight loss, blood in her stool or symptoms that awaken her from sleep.  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   Anxiety    Atypical chest pain 04/24/2017   Dyspepsia, s/p  normal EGD 03/08/17, with recommendation for lifetime anti-reflux regimen 03/26/2017   GAD (generalized anxiety disorder) 05/10/2016   GERD (gastroesophageal reflux disease)    History of spinal fusion for scoliosis 03/26/2017   HSV infection    Hyperlipidemia    Left upper quadrant pain 03/26/2017   Malaise and fatigue 03/26/2017   Paresthesias/numbness 04/24/2017   Polyarthralgia 03/28/2017   Positive Lyme disease serology    Seasonal allergies    Tension headache 03/26/2017    Past Surgical History:  Procedure Laterality Date   COLONOSCOPY  2019   DILATATION & CURETTAGE/HYSTEROSCOPY WITH MYOSURE N/A 07/13/2020   Procedure: [place];  Surgeon: Megan Salon, MD;  Location: [facility];  Service: Gynecology;  Laterality: N/A;   DILATATION & CURRETTAGE/HYSTEROSCOPY WITH RESECTOCOPE  02/12/2012   Procedure: [place];  Surgeon: Azalia Bilis, MD;  Location: [facility];  Service: Gynecology;  Laterality: N/A;   DILATION AND CURETTAGE OF UTERUS  2010   DILATION AND CURETTAGE OF UTERUS  2012   SPINAL FUSION  2008    Current Outpatient Medications  Medication Sig Dispense Refill   COLLAGEN PO Take 2,000 mg by mouth daily.     CRANBERRY PO Take 1 capsule by mouth once a week.     cyclobenzaprine (FLEXERIL) 10 MG tablet Take 1 tablet (10 mg total) by mouth 3 (three) times daily as needed for muscle spasms.     MICROGESTIN 1-20 MG-MCG tablet  Take 1 tablet by mouth daily. 84 tablet 3   Multiple Vitamins-Minerals (MULTIVITAMIN GUMMIES WOMENS PO) Take 2 tablets by mouth daily.     omeprazole (PRILOSEC) 40 MG capsule Take 1 capsule (40 mg total) by mouth daily.     ORTIKOS 9 MG CP24 Take 9 mg by mouth daily.     valACYclovir (VALTREX) 1000 MG tablet TAKE 2 TABLETS X 1 AND REPEAT IN 12 HOURS WITH SYMPTOM ONSET. 30 tablet 3   No current facility-administered medications for this visit.    Allergies as of 07/22/2020    (No Known Allergies)    Family History  Problem Relation Age of Onset   Diabetes Maternal Grandmother    Hypertension Maternal Grandmother    Heart disease Maternal Grandmother    Heart attack Maternal Grandmother    Osteoporosis Maternal Grandmother    Diabetes Maternal Grandfather    Hypertension Maternal Grandfather    Heart disease Maternal Grandfather    Heart attack Maternal Grandfather    Hypertension Mother    Anxiety disorder Mother    Diabetes Mother    Heart murmur Brother    Colon Fleming Neg Hx    Esophageal Fleming Neg Hx    Rectal Fleming Neg Hx    Stomach Fleming Neg Hx    Breast Fleming Neg Hx    Colon polyps Neg Hx     Social History   Socioeconomic History   Marital status: Married    Spouse name: Not on file   Number of children: 2   Years of education: Not on file   Highest education level: Not on file  Occupational History   Occupation: HUMAN RESOURCES    Employer: [employer]  Tobacco Use   Smoking status: Never   Smokeless tobacco: Never  Vaping Use   Vaping Use: Never used  Substance and Sexual Activity   Alcohol use: Yes    Alcohol/week: 5.0 - 6.0 standard drinks    Types: 5 - 6 Standard drinks or equivalent per week   Drug use: No   Sexual activity: Yes    Partners: Male    Birth control/protection: OCP  Other Topics Concern   Not on file  Social History Narrative   Not on file   Social Determinants of Health   Financial Resource Strain: Not on file  Food Insecurity: Not on file  Transportation Needs: Not on file  Physical Activity: Not on file  Stress: Not on file  Social Connections: Not on file  Intimate Partner Violence: Not on file    Review of Systems:    Constitutional: No weight loss, fever or chills Skin: No rash  Cardiovascular: No chest pain Respiratory: No SOB  Gastrointestinal: See HPI and otherwise negative   Physical Exam:  Vital signs: BP 94/62 (BP Location: Right Arm, Patient Position:  Sitting, Cuff Size: Normal)   Pulse 91   Ht 5' 6"  (1.676 m)   Wt 155 lb (70.3 kg)   LMP 07/06/2020   SpO2 97%   BMI 25.02 kg/m   Constitutional:   Pleasant Caucasian female appears to be in NAD, Well developed, Well nourished, alert and cooperative Respiratory: Respirations even and unlabored. Lungs clear to auscultation bilaterally.   No wheezes, crackles, or rhonchi.  Cardiovascular: Normal S1, S2. No MRG. Regular rate and rhythm. No peripheral edema, cyanosis or pallor.  Gastrointestinal:  Soft, nondistended, nontender. No rebound or guarding. Normal bowel sounds. No appreciable masses or hepatomegaly. Rectal:  Not performed.  Psychiatric:  Demonstrates good judgement and reason without abnormal affect or behaviors.  RELEVANT LABS AND IMAGING: CBC    Component Value Date/Time   WBC 7.5 07/13/2020 1217   RBC 4.23 07/13/2020 1217   HGB 13.2 07/13/2020 1217   HGB 12.7 12/26/2012 1129   HCT 38.4 07/13/2020 1217   PLT 280 07/13/2020 1217   MCV 90.8 07/13/2020 1217   MCH 31.2 07/13/2020 1217   MCHC 34.4 07/13/2020 1217   RDW 12.3 07/13/2020 1217   LYMPHSABS 1.5 09/27/2018 0900   MONOABS 0.5 09/27/2018 0900   EOSABS 0.2 09/27/2018 0900   BASOSABS 0.1 09/27/2018 0900    CMP     Component Value Date/Time   NA 138 03/22/2020 0915   K 3.8 03/22/2020 0915   CL 102 03/22/2020 0915   CO2 29 03/22/2020 0915   GLUCOSE 87 03/22/2020 0915   BUN 13 03/22/2020 0915   CREATININE 0.73 03/22/2020 0915   CREATININE 0.83 09/15/2019 1149   CALCIUM 9.9 03/22/2020 0915   PROT 7.1 03/22/2020 0915   ALBUMIN 4.1 03/22/2020 0915   AST 19 03/22/2020 0915   ALT 21 03/22/2020 0915   ALKPHOS 46 03/22/2020 0915   BILITOT 0.7 03/22/2020 0915   GFRNONAA >60 05/03/2016 0931   GFRAA >60 05/03/2016 0931    Assessment: 1.  Ulcerative colitis: New diagnosis in May of this year, currently on budesonide 9 mg daily, was doing better for a little while but now has started with continued symptoms of  "messy/smelly stool"  Plan: 1.  At time of colonoscopy Dr. Silverio Decamp had recommend 3 months of Budesonide 9 mg.  We will continue Budesonide 9 mg for another month, given ongoing symptoms, with plans to start a Mesalamine, discussed Lialda in clinic, but will discuss further with Dr. Silverio Decamp to see if she has a preference.  Would recommend starting this within the next 2 weeks. 2.  Patient has follow-up scheduled with Dr. Silverio Decamp at the end of August, recommend that she keep this today to make sure that she is doing better. 3.  Answered all the patient's questions, discussed ulcerative colitis in detail.  Ellouise Newer, PA-C [facility] 07/22/2020, 8:38 AM  Cc: Vivi Barrack, MD

## 2020-07-26 ENCOUNTER — Telehealth: Payer: Self-pay

## 2020-07-26 ENCOUNTER — Other Ambulatory Visit: Payer: Self-pay | Admitting: Family Medicine

## 2020-07-26 MED ORDER — MESALAMINE 1.2 G PO TBEC: 4.8000 g | DELAYED_RELEASE_TABLET | Freq: Every day | ORAL | 2 refills | Status: DC

## 2020-07-26 NOTE — Telephone Encounter (Signed)
Lm on vm for patient to return call.  Lialda prescription sent to pharmacy on file.

## 2020-07-26 NOTE — Telephone Encounter (Signed)
-----   Message from Levin Erp, [place] sent at 07/26/2020  9:30 AM EDT ----- Regarding: FW: Lialda? Let's try Lialda 4 tabs qd. Thanks-JLL ----- Message ----- From: Mauri Pole, MD Sent: 07/26/2020   8:20 AM EDT To: Levin Erp, PA Subject: RE: Doristine Johns?                                    I am fine with any mesalamine formulary that is preferred by insurance, if sulfasalazine is preferred then start folic acid along with it Thank you VN ----- Message ----- From: Levin Erp, PA Sent: 07/22/2020   9:08 AM EDT To: Mauri Pole, MD Subject: Lialda?                                        Just saw this patient in clinic today.  Which mesalamine were you thinking of?  Wanted to make sure before I started her on 1.  We did discuss Lialda in clinic.  Thanks-JLL

## 2020-07-26 NOTE — Telephone Encounter (Signed)
Spoke with patient in regards to recommendations, she is aware that prescription has been sent to pharmacy on file. Patient is aware that it is okay for her to continue Budesonide as directed and can start Lialda once she gets it. Patient states that she has already met her out-of -pocket for insurance so is not worried about the price of the medication right now but since she will be on this long term she was interested to know how much the medication may cost. Advised patient that she can check with the pharmacy to see if they will be able to assist her with that information. Patient verbalized understanding and had no concerns at the end of the call.

## 2020-08-30 NOTE — Progress Notes (Signed)
Reviewed and agree with documentation and assessment and plan. K. Veena Annella Prowell , MD   

## 2020-09-07 ENCOUNTER — Other Ambulatory Visit (INDEPENDENT_AMBULATORY_CARE_PROVIDER_SITE_OTHER): Payer: 59

## 2020-09-07 ENCOUNTER — Encounter: Payer: Self-pay | Admitting: Gastroenterology

## 2020-09-07 ENCOUNTER — Ambulatory Visit (INDEPENDENT_AMBULATORY_CARE_PROVIDER_SITE_OTHER): Payer: 59 | Admitting: Gastroenterology

## 2020-09-07 ENCOUNTER — Telehealth: Payer: Self-pay

## 2020-09-07 VITALS — BP 108/70 | HR 84 | Ht 66.0 in | Wt 154.0 lb

## 2020-09-07 DIAGNOSIS — K51018 Ulcerative (chronic) pancolitis with other complication: Secondary | ICD-10-CM

## 2020-09-07 LAB — CBC WITH DIFFERENTIAL/PLATELET
Basophils Absolute: 0 10*3/uL (ref 0.0–0.1)
Basophils Relative: 0.6 % (ref 0.0–3.0)
Eosinophils Absolute: 0 10*3/uL (ref 0.0–0.7)
Eosinophils Relative: 0.6 % (ref 0.0–5.0)
HCT: 39.9 % (ref 36.0–46.0)
Hemoglobin: 13.7 g/dL (ref 12.0–15.0)
Lymphocytes Relative: 17.3 % (ref 12.0–46.0)
Lymphs Abs: 1.2 10*3/uL (ref 0.7–4.0)
MCHC: 34.5 g/dL (ref 30.0–36.0)
MCV: 92.2 fl (ref 78.0–100.0)
Monocytes Absolute: 0.5 10*3/uL (ref 0.1–1.0)
Monocytes Relative: 7.2 % (ref 3.0–12.0)
Neutro Abs: 5.2 10*3/uL (ref 1.4–7.7)
Neutrophils Relative %: 74.3 % (ref 43.0–77.0)
Platelets: 314 10*3/uL (ref 150.0–400.0)
RBC: 4.33 Mil/uL (ref 3.87–5.11)
RDW: 13 % (ref 11.5–15.5)
WBC: 7 10*3/uL (ref 4.0–10.5)

## 2020-09-07 LAB — COMPREHENSIVE METABOLIC PANEL
ALT: 23 U/L (ref 0–35)
AST: 18 U/L (ref 0–37)
Albumin: 4.5 g/dL (ref 3.5–5.2)
Alkaline Phosphatase: 40 U/L (ref 39–117)
BUN: 12 mg/dL (ref 6–23)
CO2: 26 mEq/L (ref 19–32)
Calcium: 9.5 mg/dL (ref 8.4–10.5)
Chloride: 100 mEq/L (ref 96–112)
Creatinine, Ser: 0.75 mg/dL (ref 0.40–1.20)
GFR: 97.3 mL/min (ref >60.00)
Glucose, Bld: 89 mg/dL (ref 70–99)
Potassium: 4.1 mEq/L (ref 3.5–5.1)
Sodium: 134 mEq/L — ABNORMAL LOW (ref 135–145)
Total Bilirubin: 0.5 mg/dL (ref 0.2–1.2)
Total Protein: 7.8 g/dL (ref 6.0–8.3)

## 2020-09-07 LAB — SEDIMENTATION RATE: Sed Rate: 9 mm/hr (ref 0–20)

## 2020-09-07 LAB — HIGH SENSITIVITY CRP: CRP, High Sensitivity: 2.07 mg/L (ref 0.000–5.000)

## 2020-09-07 NOTE — Progress Notes (Signed)
Alison Fleming    [id number]    [birth year]  Primary Care Physician:Parker, Algis Greenhouse, MD  Referring Physician: Vivi Barrack, MD [address] [place],  [place] [id number]   Chief complaint:  Ulcerative colitis  HPI:  45 year old very pleasant female here for follow-up for ulcerative colitis  She is no longer having rectal bleeding but continues to have semiformed pasty stool with 1-3 bowel movements per day on a good day and can increase up to 10 bowel movements per day when its a bad day.  She feels her bowel habits are not back to baseline.  She is not having any formed stool.  On most days it is semiformed or loose.  She is currently on budesonide (Ortikos) 9 mg daily and mesalamine 4.8 g daily, has not noticed any significant improvement  Colonoscopy May 17, 2020 for rectal bleeding and change in bowel habits - One 5 mm polyp in the cecum, removed with a cold snare. Resected and retrieved. - Erythematous mucosa in the rectum, in the sigmoid colon, in the descending colon, in the transverse colon, in the ascending colon and in the cecum. Biopsied. - Scar in the rectum. Biopsied. - Non-bleeding internal hemorrhoid   1. Surgical [P], colon, cecum, polyp (1) - SESSILE SERRATED POLYP WITHOUT CYTOLOGIC DYSPLASIA (ONE). 2. Surgical [P], right colon -mild, patchy erythema - FOCAL ACTIVE COLITIS WITH MILD CHRONIC CHANGES. - NO GRANULOMAS OR DYSPLASIA. - SEE MICROSCOPIC DESCRIPTION. 3. Surgical [P], left colon-erythema, proctis - FOCAL ACTIVE COLITIS WITH MINIMAL CHRONIC CHANGES. - NO GRANULOMAS OR DYSPLASIA. - SEE MICROSCOPIC DESCRIPTION.   Colonoscopy November 28, 2017 for change in bowel habits - A single erosion in the terminal ileum. Biopsied. - Patchy mild inflammation was found in the rectum secondary to proctitis. Biopsied. - The entire examined colon is normal. Biopsied. - Non-bleeding internal hemorrhoids.   1. Surgical [P], terminal ileum - ILEAL MUCOSA  WITH NO SPECIFIC HISTOPATHOLOGIC CHANGES. SEE NOTE. 2. Surgical [P], right colon - COLONIC MUCOSA WITH NO SPECIFIC HISTOPATHOLOGIC CHANGES. SEE NOTE. 3. Surgical [P], left colon BX - COLONIC MUCOSA WITH NO SPECIFIC HISTOPATHOLOGIC CHANGES. SEE NOTE. 4. Surgical [P], rectum - RECTAL MUCOSA WITH NO SPECIFIC HISTOPATHOLOGIC CHANGES. SEE NOTE. Diagnosis Note 1. -4. The ileocolonic biopsies are negative for acute inflammation, features of chronicity or granulomas.   EGD March 08, 2017: Normal   Abdominal ultrasound January 03, 2017: Normal   Outpatient Encounter Medications as of 09/07/2020  Medication Sig   COLLAGEN PO Take 2,000 mg by mouth daily.   CRANBERRY PO Take 1 capsule by mouth once a week.   cyclobenzaprine (FLEXERIL) 10 MG tablet Take 1 tablet (10 mg total) by mouth 3 (three) times daily as needed for muscle spasms.   mesalamine (LIALDA) 1.2 g EC tablet Take 4 tablets (4.8 g total) by mouth daily with breakfast.   MICROGESTIN 1-20 MG-MCG tablet Take 1 tablet by mouth daily.   Multiple Vitamins-Minerals (MULTIVITAMIN GUMMIES WOMENS PO) Take 2 tablets by mouth daily.   omeprazole (PRILOSEC) 40 MG capsule TAKE 1 CAPSULE BY MOUTH EVERY DAY   ORTIKOS 9 MG CP24 Take 9 mg by mouth daily.   valACYclovir (VALTREX) 1000 MG tablet TAKE 2 TABLETS X 1 AND REPEAT IN 12 HOURS WITH SYMPTOM ONSET.   No facility-administered encounter medications on file as of 09/07/2020.    Allergies as of 09/07/2020   (No Known Allergies)    Past Medical History:  Diagnosis  Date   Adjustment insomnia 03/26/2017   Anxiety    Atypical chest pain 04/24/2017   Dyspepsia, s/p normal EGD 03/08/17, with recommendation for lifetime anti-reflux regimen 03/26/2017   GAD (generalized anxiety disorder) 05/10/2016   GERD (gastroesophageal reflux disease)    History of spinal fusion for scoliosis 03/26/2017   HSV infection    Hyperlipidemia    Left upper quadrant pain 03/26/2017   Malaise and fatigue  03/26/2017   Paresthesias/numbness 04/24/2017   Polyarthralgia 03/28/2017   Positive Lyme disease serology    Seasonal allergies    Tension headache 03/26/2017    Past Surgical History:  Procedure Laterality Date   COLONOSCOPY  2019   DILATATION & CURETTAGE/HYSTEROSCOPY WITH MYOSURE N/A 07/13/2020   Procedure: [place];  Surgeon: Megan Salon, MD;  Location: [facility];  Service: Gynecology;  Laterality: N/A;   DILATATION & CURRETTAGE/HYSTEROSCOPY WITH RESECTOCOPE  02/12/2012   Procedure: [place];  Surgeon: Azalia Bilis, MD;  Location: [facility];  Service: Gynecology;  Laterality: N/A;   DILATION AND CURETTAGE OF UTERUS  2010   DILATION AND CURETTAGE OF UTERUS  2012   SPINAL FUSION  2008    Family History  Problem Relation Age of Onset   Diabetes Maternal Grandmother    Hypertension Maternal Grandmother    Heart disease Maternal Grandmother    Heart attack Maternal Grandmother    Osteoporosis Maternal Grandmother    Diabetes Maternal Grandfather    Hypertension Maternal Grandfather    Heart disease Maternal Grandfather    Heart attack Maternal Grandfather    Hypertension Mother    Anxiety disorder Mother    Diabetes Mother    Heart murmur Brother    Colon cancer Neg Hx    Esophageal cancer Neg Hx    Rectal cancer Neg Hx    Stomach cancer Neg Hx    Breast cancer Neg Hx    Colon polyps Neg Hx     Social History   Socioeconomic History   Marital status: Married    Spouse name: Not on file   Number of children: 2   Years of education: Not on file   Highest education level: Not on file  Occupational History   Occupation: HUMAN RESOURCES    Employer: [employer]  Tobacco Use   Smoking status: Never   Smokeless tobacco: Never  Vaping Use   Vaping Use: Never used  Substance and Sexual Activity   Alcohol use: Yes    Alcohol/week: 5.0 - 6.0 standard drinks    Types: 5 -  6 Standard drinks or equivalent per week   Drug use: No   Sexual activity: Yes    Partners: Male    Birth control/protection: OCP  Other Topics Concern   Not on file  Social History Narrative   Not on file   Social Determinants of Health   Financial Resource Strain: Not on file  Food Insecurity: Not on file  Transportation Needs: Not on file  Physical Activity: Not on file  Stress: Not on file  Social Connections: Not on file  Intimate Partner Violence: Not on file      Review of systems: All other review of systems negative except as mentioned in the HPI.   Physical Exam: Vitals:   09/07/20 1039  BP: 108/70  Pulse: 84   Body mass index is 24.86 kg/m. Gen:      No acute distress HEENT:  sclera anicteric Abd:  soft, non-tender; no palpable masses, no distension Ext:    No edema Neuro: alert and oriented x 3 Psych: normal mood and affect  Data Reviewed:  Reviewed labs, radiology imaging, old records and pertinent past GI work up   Assessment and Plan/Recommendations:  45 year old very pleasant female with recent diagnosis of ulcerative pan colitis in May 2022, when she underwent colonoscopy for evaluation of rectal bleeding  She continues to have persistent symptoms from ulcerative colitis, diarrhea, fecal urgency and increased frequency and change in stool consistency. Continue budesonide 9 mg daily and mesalamine 4.8 g daily Will need to step up therapy given steroid dependence  Plan to start Rinvoq with induction dose once approved by insurance.  Patient was given reading material to review  Check CBC, CMP, CRP, ESR Check TB QuantiFERON gold  She will need immunization for pneumococcal and flu  Return in 2 months or sooner if needed  This visit required >40 minutes of patient care (this includes precharting, chart review, review of results, face-to-face time used for counseling as well as treatment plan and follow-up. The patient was provided an  opportunity to ask questions and all were answered. The patient agreed with the plan and demonstrated an understanding of the instructions.  Damaris Hippo , MD    CC: Vivi Barrack, MD

## 2020-09-07 NOTE — Telephone Encounter (Signed)
-----   Message from Oda Kilts, [place] sent at 09/07/2020 11:38 AM EDT ----- Regarding: RINVOQ Dr Silverio Decamp wants you to work on getting Rinvoq approved for this patient if approved she will need to be scheduled for a Pneumovax  Thanks

## 2020-09-07 NOTE — Patient Instructions (Addendum)
Your provider has requested that you go to the basement level for lab work before leaving today. Press "B" on the elevator. The lab is located at the first door on the left as you exit the elevator.   We will try and get Rinvoq approved, Beth RN will be working on this  We will refill Ortikos and Mesalamine for you today  You will need to set up a Pneumovax Vaccine if approved for the Rinvoq   Due to recent changes in healthcare laws, you may see the results of your imaging and laboratory studies on MyChart before your provider has had a chance to review them.  We understand that in some cases there may be results that are confusing or concerning to you. Not all laboratory results come back in the same time frame and the provider may be waiting for multiple results in order to interpret others.  Please give [place] 48 hours in order for your provider to thoroughly review all the results before contacting the office for clarification of your results.    If you are age 57 or older, your body mass index should be between 23-30. Your Body mass index is 24.86 kg/m. If this is out of the aforementioned range listed, please consider follow up with your Primary Care Provider.  If you are age 84 or younger, your body mass index should be between 19-25. Your Body mass index is 24.86 kg/m. If this is out of the aformentioned range listed, please consider follow up with your Primary Care Provider.   __________________________________________________________  The [facility] providers would like to encourage you to use [facility] to communicate with providers for non-urgent requests or questions.  Due to long hold times on the telephone, sending your provider a message by [facility] may be a faster and more efficient way to get a response.  Please allow 48 business hours for a response.  Please remember that this is for non-urgent requests.    I appreciate the  opportunity to care for you  Thank You   Harl [place] , MD

## 2020-09-09 ENCOUNTER — Other Ambulatory Visit: Payer: Self-pay | Admitting: Physician Assistant

## 2020-09-09 ENCOUNTER — Other Ambulatory Visit: Payer: Self-pay

## 2020-09-09 MED ORDER — ORTIKOS 9 MG PO CP24: 9.0000 mg | ORAL_CAPSULE | Freq: Every day | ORAL | 0 refills | Status: DC

## 2020-09-09 NOTE — Telephone Encounter (Signed)
Rinvoq Complete form and prescription form faxed. This will start the PA on the patient.

## 2020-09-11 LAB — QUANTIFERON-TB GOLD PLUS
Mitogen-NIL: 3.77 IU/mL
NIL: 0.02 IU/mL
QuantiFERON-TB Gold Plus: NEGATIVE
TB1-NIL: 0 IU/mL
TB2-NIL: 0 IU/mL

## 2020-09-22 ENCOUNTER — Telehealth: Payer: Self-pay | Admitting: Gastroenterology

## 2020-09-22 NOTE — Telephone Encounter (Signed)
[facility] needs clarification on Renvoq. They said that quantity and directions don't match. Pls call them at [phone].

## 2020-09-22 NOTE — Telephone Encounter (Signed)
Beth, Do you know about this? I see you done a prior auth

## 2020-09-23 NOTE — Telephone Encounter (Signed)
Spoke with pharmacist. The prescription did not show the refill on the Renvoq 45 mg for induction. Confirmed she will take this for 8 weeks.

## 2020-09-29 ENCOUNTER — Encounter (HOSPITAL_BASED_OUTPATIENT_CLINIC_OR_DEPARTMENT_OTHER): Payer: Self-pay | Admitting: Obstetrics & Gynecology

## 2020-09-29 ENCOUNTER — Other Ambulatory Visit: Payer: Self-pay

## 2020-09-29 ENCOUNTER — Ambulatory Visit (INDEPENDENT_AMBULATORY_CARE_PROVIDER_SITE_OTHER): Payer: 59 | Admitting: Obstetrics & Gynecology

## 2020-09-29 VITALS — BP 116/75 | HR 81 | Ht 66.5 in | Wt 155.0 lb

## 2020-09-29 DIAGNOSIS — N930 Postcoital and contact bleeding: Secondary | ICD-10-CM | POA: Diagnosis not present

## 2020-09-29 DIAGNOSIS — N840 Polyp of corpus uteri: Secondary | ICD-10-CM

## 2020-09-29 DIAGNOSIS — N926 Irregular menstruation, unspecified: Secondary | ICD-10-CM

## 2020-09-29 NOTE — Progress Notes (Signed)
GYNECOLOGY  VISIT  CC:   follow up  HPI: 45 y.o. G34P2012 Married White or Caucasian female here for follow up after hysteroscopy with polyp resection.  Since procedure, she's had one episode of post coital bleeding but this was very light.  She's had one episode of mid cycle spotting that was very light and was only one occurrence.  Pap 4/22 neg.  ECC and pathology from surgery was negative except for the polyp which was benign.  She is continuing to take her microgestin.    She has been on different combination OCPs without any improvement in symptoms.  Irregular spotting is better than it has been since it started.  All the prior OCPs have been monophasic and we discussed possible triphasic or POP as well if decides to make a change.    Separately, she is getting ready to start an immunosuppressant for her ulcerative colitis.  She had a TB test in August.  Still going through the approval process.    GYNECOLOGIC HISTORY: Patient's last menstrual period was 09/29/2020.  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   Encounter for surveillance of contraceptive pills 04/26/2020   Inclusion cyst of vulva 04/26/2020   Change in bowel habits 11/16/2017   Palpitation 04/30/2017   Paresthesias/numbness 04/24/2017   Polyarthralgia 03/28/2017   Dyspepsia, s/p normal EGD 03/08/17, with recommendation for lifetime anti-reflux regimen 03/26/2017   Cervical pain with history of scoliosis 03/26/2017   Malaise and fatigue 03/26/2017   Tension headache 03/26/2017    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   Anxiety    Atypical chest pain 04/24/2017   Dyspepsia, s/p normal EGD 03/08/17, with recommendation for lifetime anti-reflux regimen 03/26/2017   GAD (generalized anxiety disorder) 05/10/2016   GERD (gastroesophageal reflux disease)    History of  spinal fusion for scoliosis 03/26/2017   HSV infection    Hyperlipidemia    Left upper quadrant pain 03/26/2017   Malaise and fatigue 03/26/2017   Paresthesias/numbness 04/24/2017   Polyarthralgia 03/28/2017   Positive Lyme disease serology    Seasonal allergies    Tension headache 03/26/2017    Past Surgical History:  Procedure Laterality Date   COLONOSCOPY  2019   DILATATION & CURETTAGE/HYSTEROSCOPY WITH MYOSURE N/A 07/13/2020   Procedure: DILATATION & CURETTAGE/HYSTEROSCOPY WITH MYOSURE;  Surgeon: Megan Salon, MD;  Location: [facility];  Service: Gynecology;  Laterality: N/A;   DILATATION & CURRETTAGE/HYSTEROSCOPY WITH RESECTOCOPE  02/12/2012   Procedure: [place];  Surgeon: Azalia Bilis, MD;  Location: [facility];  Service: Gynecology;  Laterality: N/A;   DILATION AND CURETTAGE OF UTERUS  2010   DILATION AND CURETTAGE OF UTERUS  2012   SPINAL FUSION  2008    MEDS:   Current Outpatient Medications on File Prior to Visit  Medication Sig Dispense Refill   COLLAGEN PO Take 2,000 mg by mouth daily.     CRANBERRY PO Take 1 capsule by mouth once a week.     cyclobenzaprine (FLEXERIL) 10 MG tablet Take 1 tablet (10 mg total) by mouth 3 (three) times daily as needed for muscle spasms.     mesalamine (LIALDA) 1.2 g EC tablet Take 4 tablets (4.8 g total) by mouth daily with breakfast. 120 tablet 2   MICROGESTIN 1-20 MG-MCG tablet Take 1 tablet by  mouth daily. 84 tablet 3   Multiple Vitamins-Minerals (MULTIVITAMIN GUMMIES WOMENS PO) Take 2 tablets by mouth daily.     omeprazole (PRILOSEC) 40 MG capsule TAKE 1 CAPSULE BY MOUTH EVERY DAY 30 capsule 2   ORTIKOS 9 MG CP24 Take 9 mg by mouth daily. 30 capsule 0   valACYclovir (VALTREX) 1000 MG tablet TAKE 2 TABLETS X 1 AND REPEAT IN 12 HOURS WITH SYMPTOM ONSET. 30 tablet 3   No current facility-administered medications on file prior to visit.    ALLERGIES: Patient has no known allergies.  Family  History  Problem Relation Age of Onset   Diabetes Maternal Grandmother    Hypertension Maternal Grandmother    Heart disease Maternal Grandmother    Heart attack Maternal Grandmother    Osteoporosis Maternal Grandmother    Diabetes Maternal Grandfather    Hypertension Maternal Grandfather    Heart disease Maternal Grandfather    Heart attack Maternal Grandfather    Hypertension Mother    Anxiety disorder Mother    Diabetes Mother    Heart murmur Brother    Colon cancer Neg Hx    Esophageal cancer Neg Hx    Rectal cancer Neg Hx    Stomach cancer Neg Hx    Breast cancer Neg Hx    Colon polyps Neg Hx     SH:  married, non smoker  Review of Systems  All other systems reviewed and are negative.  PHYSICAL EXAMINATION:    BP 116/75 (BP Location: Right Arm, Patient Position: Sitting, Cuff Size: Small)   Pulse 81   Ht 5' 6.5" (1.689 m) Comment: reported  Wt 155 lb (70.3 kg)   LMP 09/29/2020   BMI 24.64 kg/m     General appearance: alert, cooperative and appears stated age Abdomen: soft, non-tender; bowel sounds normal; no masses,  no organomegaly Lymph:  no inguinal LAD noted  Pelvic: External genitalia:  no lesions              Urethra:  normal appearing urethra with no masses, tenderness or lesions              Bartholins and Skenes: normal                 Vagina: normal appearing vagina with normal color and discharge, no lesions              Cervix: no lesions              Bimanual Exam:  Uterus:  normal size, contour, position, consistency, mobility, non-tender              Adnexa: no mass, fullness, tenderness   Chaperone, Octaviano Batty, CMA, was present for exam.  Assessment/Plan: 1. PCB (post coital bleeding) - improved  2. Irregular bleeding - improved - consider triphasic or POP is worsens again.  Dosing, side effects discussed.  3. Endometrial polyp

## 2020-09-30 ENCOUNTER — Telehealth: Payer: Self-pay | Admitting: Gastroenterology

## 2020-09-30 NOTE — Telephone Encounter (Signed)
Inbound call from [facility]. States she need verbal order clarification or a new prescription sent over for Rinvoq 62m and Rinvoq 183m Best contact [phone]

## 2020-10-04 ENCOUNTER — Other Ambulatory Visit: Payer: Self-pay

## 2020-10-04 MED ORDER — RINVOQ 45 MG PO TB24: 1.0000 | ORAL_TABLET | Freq: Every day | ORAL | 1 refills | Status: DC

## 2020-10-04 MED ORDER — RINVOQ 15 MG PO TB24: 1.0000 | ORAL_TABLET | Freq: Every day | ORAL | 11 refills | Status: DC

## 2020-10-04 NOTE — Telephone Encounter (Signed)
Induction dose of Rinvoq 46 mg x 8 weeks and maintenance dose of Rinvoq 15 mg prescriptions transmitted to the [facility].

## 2020-10-11 ENCOUNTER — Other Ambulatory Visit: Payer: Self-pay | Admitting: Gastroenterology

## 2020-10-11 ENCOUNTER — Other Ambulatory Visit: Payer: Self-pay

## 2020-10-11 NOTE — Telephone Encounter (Signed)
Dr Silverio Decamp- Patient said on 10/05/20 that the Rinvoq had still not been approved through her insurance company but that she was ok with the delay because she wanted to talk to you about it on 11/10/20 at her office visit with you.   Regarding the budesonide, since she is currently getting Ortikos and it only comes in 9 mg tablet and 6 mg tablet, neither of which can be split, do you want her to go to a regular 3 mg budesonide when she gets to the last part of the taper?

## 2020-10-11 NOTE — Telephone Encounter (Signed)
  October 11, 2020 Mauri Pole, MD to Roetta Sessions, CMA   Please check if she started Rinvoq yet? And ok to decrease Budesonide to 29m daily X 2 weeks and then 357mdaily X 2 weeks and stop.  Thanks

## 2020-10-12 NOTE — Telephone Encounter (Signed)
Okay to switch to Entocort or generic budesonide for the taper dose of 3 mg daily.  Thank you

## 2020-10-13 MED ORDER — ORTIKOS 6 MG PO CP24
6.0000 mg | ORAL_CAPSULE | Freq: Every day | ORAL | 1 refills | Status: DC
Start: 2020-10-13 — End: 2020-11-16

## 2020-10-13 NOTE — Telephone Encounter (Signed)
6 mg sent to pharmacy

## 2020-10-14 MED ORDER — BUDESONIDE 3 MG PO CPEP: 3.0000 mg | ORAL_CAPSULE | Freq: Every day | ORAL | 0 refills | Status: DC

## 2020-10-24 ENCOUNTER — Other Ambulatory Visit: Payer: Self-pay | Admitting: Family Medicine

## 2020-10-24 ENCOUNTER — Other Ambulatory Visit: Payer: Self-pay | Admitting: Physician Assistant

## 2020-11-10 ENCOUNTER — Other Ambulatory Visit (INDEPENDENT_AMBULATORY_CARE_PROVIDER_SITE_OTHER): Payer: 59

## 2020-11-10 ENCOUNTER — Ambulatory Visit (INDEPENDENT_AMBULATORY_CARE_PROVIDER_SITE_OTHER): Payer: 59 | Admitting: Gastroenterology

## 2020-11-10 ENCOUNTER — Encounter: Payer: Self-pay | Admitting: Gastroenterology

## 2020-11-10 VITALS — BP 110/60 | HR 97 | Ht 66.5 in | Wt 157.5 lb

## 2020-11-10 DIAGNOSIS — K5180 Other ulcerative colitis without complications: Secondary | ICD-10-CM

## 2020-11-10 LAB — CBC WITH DIFFERENTIAL/PLATELET
Basophils Absolute: 0 10*3/uL (ref 0.0–0.1)
Basophils Relative: 0.6 % (ref 0.0–3.0)
Eosinophils Absolute: 0.1 10*3/uL (ref 0.0–0.7)
Eosinophils Relative: 1.7 % (ref 0.0–5.0)
HCT: 38.1 % (ref 36.0–46.0)
Hemoglobin: 13.2 g/dL (ref 12.0–15.0)
Lymphocytes Relative: 19.9 % (ref 12.0–46.0)
Lymphs Abs: 1.4 10*3/uL (ref 0.7–4.0)
MCHC: 34.6 g/dL (ref 30.0–36.0)
MCV: 90.3 fl (ref 78.0–100.0)
Monocytes Absolute: 0.5 10*3/uL (ref 0.1–1.0)
Monocytes Relative: 7.6 % (ref 3.0–12.0)
Neutro Abs: 5 10*3/uL (ref 1.4–7.7)
Neutrophils Relative %: 70.2 % (ref 43.0–77.0)
Platelets: 266 10*3/uL (ref 150.0–400.0)
RBC: 4.22 Mil/uL (ref 3.87–5.11)
RDW: 12.2 % (ref 11.5–15.5)
WBC: 7.2 10*3/uL (ref 4.0–10.5)

## 2020-11-10 LAB — COMPREHENSIVE METABOLIC PANEL
ALT: 16 U/L (ref 0–35)
AST: 18 U/L (ref 0–37)
Albumin: 4.4 g/dL (ref 3.5–5.2)
Alkaline Phosphatase: 40 U/L (ref 39–117)
BUN: 11 mg/dL (ref 6–23)
CO2: 26 mEq/L (ref 19–32)
Calcium: 9.4 mg/dL (ref 8.4–10.5)
Chloride: 103 mEq/L (ref 96–112)
Creatinine, Ser: 0.75 mg/dL (ref 0.40–1.20)
GFR: 97.18 mL/min (ref >60.00)
Glucose, Bld: 88 mg/dL (ref 70–99)
Potassium: 4 mEq/L (ref 3.5–5.1)
Sodium: 137 mEq/L (ref 135–145)
Total Bilirubin: 0.5 mg/dL (ref 0.2–1.2)
Total Protein: 7.5 g/dL (ref 6.0–8.3)

## 2020-11-10 LAB — HIGH SENSITIVITY CRP: CRP, High Sensitivity: 4.18 mg/L (ref 0.000–5.000)

## 2020-11-10 NOTE — Progress Notes (Signed)
Alison Fleming    [id number]    05/19/1975  Primary Care Physician:Parker, Algis Greenhouse, MD  Referring Physician: Vivi Barrack, MD [address] [place],   [address]   Chief complaint:  Ulcerative colitis  HPI: 45 year old very pleasant female here for follow-up for ulcerative colitis   She is no longer having rectal bleeding or diarrhea.She is currently on mesalamine 4.8 g daily  She is currently off budesonide.  She feels better when she stopped budesonide.  She was experiencing side effects from it.  She does not feel it helped much with her UC symptoms.  On average 1-3 BM per day and its formed, no blood    Colonoscopy May 17, 2020 for rectal bleeding and change in bowel habits - One 5 mm polyp in the cecum, removed with a cold snare. Resected and retrieved. - Erythematous mucosa in the rectum, in the sigmoid colon, in the descending colon, in the transverse colon, in the ascending colon and in the cecum. Biopsied. - Scar in the rectum. Biopsied. - Non-bleeding internal hemorrhoid   1. Surgical [P], colon, cecum, polyp (1) - SESSILE SERRATED POLYP WITHOUT CYTOLOGIC DYSPLASIA (ONE). 2. Surgical [P], right colon -mild, patchy erythema - FOCAL ACTIVE COLITIS WITH MILD CHRONIC CHANGES. - NO GRANULOMAS OR DYSPLASIA. - SEE MICROSCOPIC DESCRIPTION. 3. Surgical [P], left colon-erythema, proctis - FOCAL ACTIVE COLITIS WITH MINIMAL CHRONIC CHANGES. - NO GRANULOMAS OR DYSPLASIA. - SEE MICROSCOPIC DESCRIPTION.   Colonoscopy November 28, 2017 for change in bowel habits - A single erosion in the terminal ileum. Biopsied. - Patchy mild inflammation was found in the rectum secondary to proctitis. Biopsied. - The entire examined colon is normal. Biopsied. - Non-bleeding internal hemorrhoids.   1. Surgical [P], terminal ileum - ILEAL MUCOSA WITH NO SPECIFIC HISTOPATHOLOGIC CHANGES. SEE NOTE. 2. Surgical [P], right colon - COLONIC MUCOSA WITH NO SPECIFIC  HISTOPATHOLOGIC CHANGES. SEE NOTE. 3. Surgical [P], left colon BX - COLONIC MUCOSA WITH NO SPECIFIC HISTOPATHOLOGIC CHANGES. SEE NOTE. 4. Surgical [P], rectum - RECTAL MUCOSA WITH NO SPECIFIC HISTOPATHOLOGIC CHANGES. SEE NOTE.  1. -4. The ileocolonic biopsies are negative for acute inflammation, features of chronicity or granulomas.   EGD March 08, 2017: Normal   Abdominal ultrasound January 03, 2017: Normal     Outpatient Encounter Medications as of 11/10/2020  Medication Sig   COLLAGEN PO Take 2,000 mg by mouth daily.   CRANBERRY PO Take 1 capsule by mouth once a week.   cyclobenzaprine (FLEXERIL) 10 MG tablet Take 1 tablet (10 mg total) by mouth 3 (three) times daily as needed for muscle spasms.   mesalamine (LIALDA) 1.2 g EC tablet TAKE 4 TABLETS BY MOUTH DAILY WITH BREAKFAST.   MICROGESTIN 1-20 MG-MCG tablet Take 1 tablet by mouth daily.   Multiple Vitamins-Minerals (MULTIVITAMIN GUMMIES WOMENS PO) Take 2 tablets by mouth daily.   omeprazole (PRILOSEC) 40 MG capsule TAKE 1 CAPSULE BY MOUTH EVERY DAY   valACYclovir (VALTREX) 1000 MG tablet TAKE 2 TABLETS X 1 AND REPEAT IN 12 HOURS WITH SYMPTOM ONSET.   budesonide (ENTOCORT EC) 3 MG 24 hr capsule Take 1 capsule (3 mg total) by mouth daily. X 14 days (should be taken AFTER Ortikos 6 mg tablets)   Budesonide ER (ORTIKOS) 6 MG CP24 Take 6 mg by mouth daily.   Upadacitinib ER (RINVOQ) 15 MG TB24 Take 1 tablet by mouth daily. BEGIN AFTER 8 WK INDUCTION IS COMPLETE   Upadacitinib ER (  RINVOQ) 45 MG TB24 Take 1 tablet by mouth daily. TAKE FOR 8 WEEKS   No facility-administered encounter medications on file as of 11/10/2020.    Allergies as of 11/10/2020   (No Known Allergies)    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   Anxiety    Atypical chest pain 04/24/2017   Dyspepsia, s/p normal EGD 03/08/17, with recommendation for lifetime anti-reflux regimen 03/26/2017   GAD (generalized anxiety  disorder) 05/10/2016   GERD (gastroesophageal reflux disease)    History of spinal fusion for scoliosis 03/26/2017   HSV infection    Hyperlipidemia    Left upper quadrant pain 03/26/2017   Malaise and fatigue 03/26/2017   Paresthesias/numbness 04/24/2017   Polyarthralgia 03/28/2017   Positive Lyme disease serology    Seasonal allergies    Tension headache 03/26/2017    Past Surgical History:  Procedure Laterality Date   COLONOSCOPY  2019   DILATATION & CURETTAGE/HYSTEROSCOPY WITH MYOSURE N/A 07/13/2020   Procedure: [place];  Surgeon: Megan Salon, MD;  Location: [facility];  Service: Gynecology;  Laterality: N/A;   DILATATION & CURRETTAGE/HYSTEROSCOPY WITH RESECTOCOPE  02/12/2012   Procedure: [place];  Surgeon: Azalia Bilis, MD;  Location: [facility];  Service: Gynecology;  Laterality: N/A;   DILATION AND CURETTAGE OF UTERUS  2010   DILATION AND CURETTAGE OF UTERUS  2012   SPINAL FUSION  2008    Family History  Problem Relation Age of Onset   Diabetes Maternal Grandmother    Hypertension Maternal Grandmother    Heart disease Maternal Grandmother    Heart attack Maternal Grandmother    Osteoporosis Maternal Grandmother    Diabetes Maternal Grandfather    Hypertension Maternal Grandfather    Heart disease Maternal Grandfather    Heart attack Maternal Grandfather    Hypertension Mother    Anxiety disorder Mother    Diabetes Mother    Heart murmur Brother    Colon cancer Neg Hx    Esophageal cancer Neg Hx    Rectal cancer Neg Hx    Stomach cancer Neg Hx    Breast cancer Neg Hx    Colon polyps Neg Hx     Social History   Socioeconomic History   Marital status: Married    Spouse name: Not on file   Number of children: 2   Years of education: Not on file   Highest education level: Not on file  Occupational History   Occupation: HUMAN RESOURCES    Employer: [employer]   Tobacco Use   Smoking status: Never   Smokeless tobacco: Never  Vaping Use   Vaping Use: Never used  Substance and Sexual Activity   Alcohol use: Yes    Alcohol/week: 5.0 - 6.0 standard drinks    Types: 5 - 6 Standard drinks or equivalent per week   Drug use: No   Sexual activity: Yes    Partners: Male    Birth control/protection: OCP  Other Topics Concern   Not on file  Social History Narrative   Not on file   Social Determinants of Health   Financial Resource Strain: Not on file  Food Insecurity: Not on file  Transportation Needs: Not on file  Physical Activity: Not on file  Stress: Not on file  Social Connections: Not on file  Intimate Partner Violence: Not on file      Review of systems: All other review of  systems negative except as mentioned in the HPI.   Physical Exam: Vitals:   11/10/20 0759  BP: 110/60  Pulse: 97   Body mass index is 25.04 kg/m. Gen:      No acute distress HEENT:  sclera anicteric Abd:      soft, non-tender; no palpable masses, no distension Ext:    No edema Neuro: alert and oriented x 3 Psych: normal mood and affect  Data Reviewed:  Reviewed labs, radiology imaging, old records and pertinent past GI work up   Assessment and Plan/Recommendations:  45 year old very pleasant female with history of ulcerative colitis, currently in clinical remission after course of budesonide Continue Lialda 4.8 g daily  She is hesitant to start Biologics due to potential side effects and also the cost associated with the need to be on it for long-term Will hold off appealing for Rinvoq at this point, was not approved by her insurance.  We will plan to monitor closely her symptoms.  Follow-up ESR and fecal calprotectin in 4 to 6 weeks Advised patient to call with any change in symptoms  Return in 3 months  This visit required 40 minutes of patient care (this includes precharting, chart review, review of results, face-to-face time used for  counseling as well as treatment plan and follow-up. The patient was provided an opportunity to ask questions and all were answered. The patient agreed with the plan and demonstrated an understanding of the instructions.  Damaris Hippo , MD    CC: Vivi Barrack, MD

## 2020-11-10 NOTE — Patient Instructions (Addendum)
Go to the basement for labs today  You will come back in 1 month to have a ESR drawn, you can turn in your Fecal Calprotectin test at that time  Due to recent changes in healthcare laws, you may see the results of your imaging and laboratory studies on MyChart before your provider has had a chance to review them.  We understand that in some cases there may be results that are confusing or concerning to you. Not all laboratory results come back in the same time frame and the provider may be waiting for multiple results in order to interpret others.  Please give [place] 48 hours in order for your provider to thoroughly review all the results before contacting the office for clarification of your results.  If you are age 44 or older, your body mass index should be between 23-30. Your Body mass index is 25.04 kg/m. If this is out of the aforementioned range listed, please consider follow up with your Primary Care Provider.  If you are age 36 or younger, your body mass index should be between 19-25. Your Body mass index is 25.04 kg/m. If this is out of the aformentioned range listed, please consider follow up with your Primary Care Provider.   ________________________________________________________  The [place] [place] [facility] providers would like to encourage you to use [facility] to communicate with providers for non-urgent requests or questions.  Due to long hold times on the telephone, sending your provider a message by [facility] may be a faster and more efficient way to get a response.  Please allow 48 business hours for a response.  Please remember that this is for non-urgent requests.  _______________________________________________________   I appreciate the  opportunity to care for you  Thank You   Harl Bowie , MD

## 2020-11-16 ENCOUNTER — Encounter: Payer: Self-pay | Admitting: Gastroenterology

## 2020-12-22 ENCOUNTER — Encounter: Payer: Self-pay | Admitting: Gastroenterology

## 2020-12-22 ENCOUNTER — Other Ambulatory Visit: Payer: Self-pay | Admitting: Obstetrics & Gynecology

## 2020-12-22 DIAGNOSIS — Z1231 Encounter for screening mammogram for malignant neoplasm of breast: Secondary | ICD-10-CM

## 2020-12-25 ENCOUNTER — Other Ambulatory Visit: Payer: Self-pay | Admitting: Physician Assistant

## 2020-12-25 ENCOUNTER — Other Ambulatory Visit: Payer: Self-pay | Admitting: Family Medicine

## 2020-12-26 ENCOUNTER — Encounter: Payer: Self-pay | Admitting: Gastroenterology

## 2020-12-27 ENCOUNTER — Encounter: Payer: Self-pay | Admitting: Family Medicine

## 2020-12-27 NOTE — Telephone Encounter (Signed)
Please advise 

## 2020-12-28 ENCOUNTER — Other Ambulatory Visit: Payer: Self-pay | Admitting: *Deleted

## 2020-12-28 DIAGNOSIS — R5383 Other fatigue: Secondary | ICD-10-CM

## 2020-12-28 DIAGNOSIS — R5381 Other malaise: Secondary | ICD-10-CM

## 2020-12-31 ENCOUNTER — Ambulatory Visit: Payer: 59 | Admitting: Gastroenterology

## 2021-01-03 ENCOUNTER — Other Ambulatory Visit (INDEPENDENT_AMBULATORY_CARE_PROVIDER_SITE_OTHER): Payer: 59

## 2021-01-03 DIAGNOSIS — R5381 Other malaise: Secondary | ICD-10-CM

## 2021-01-03 DIAGNOSIS — K5180 Other ulcerative colitis without complications: Secondary | ICD-10-CM | POA: Diagnosis not present

## 2021-01-03 DIAGNOSIS — R5383 Other fatigue: Secondary | ICD-10-CM

## 2021-01-03 LAB — CBC WITH DIFFERENTIAL/PLATELET
Basophils Absolute: 0.1 10*3/uL (ref 0.0–0.1)
Basophils Relative: 1.1 % (ref 0.0–3.0)
Eosinophils Absolute: 0.2 10*3/uL (ref 0.0–0.7)
Eosinophils Relative: 2.8 % (ref 0.0–5.0)
HCT: 37.7 % (ref 36.0–46.0)
Hemoglobin: 12.9 g/dL (ref 12.0–15.0)
Lymphocytes Relative: 25.4 % (ref 12.0–46.0)
Lymphs Abs: 1.5 10*3/uL (ref 0.7–4.0)
MCHC: 34.3 g/dL (ref 30.0–36.0)
MCV: 90.3 fl (ref 78.0–100.0)
Monocytes Absolute: 0.4 10*3/uL (ref 0.1–1.0)
Monocytes Relative: 7 % (ref 3.0–12.0)
Neutro Abs: 3.7 10*3/uL (ref 1.4–7.7)
Neutrophils Relative %: 63.7 % (ref 43.0–77.0)
Platelets: 257 10*3/uL (ref 150.0–400.0)
RBC: 4.17 Mil/uL (ref 3.87–5.11)
RDW: 12.7 % (ref 11.5–15.5)
WBC: 5.8 10*3/uL (ref 4.0–10.5)

## 2021-01-03 LAB — LIPID PANEL
Cholesterol: 170 mg/dL (ref 0–200)
HDL: 56.9 mg/dL (ref >39.00)
LDL Cholesterol: 89 mg/dL (ref 0–99)
NonHDL: 112.81
Total CHOL/HDL Ratio: 3
Triglycerides: 119 mg/dL (ref 0.0–149.0)
VLDL: 23.8 mg/dL (ref 0.0–40.0)

## 2021-01-03 LAB — COMPREHENSIVE METABOLIC PANEL
ALT: 15 U/L (ref 0–35)
AST: 14 U/L (ref 0–37)
Albumin: 4.1 g/dL (ref 3.5–5.2)
Alkaline Phosphatase: 41 U/L (ref 39–117)
BUN: 13 mg/dL (ref 6–23)
CO2: 26 mEq/L (ref 19–32)
Calcium: 9.2 mg/dL (ref 8.4–10.5)
Chloride: 103 mEq/L (ref 96–112)
Creatinine, Ser: 0.72 mg/dL (ref 0.40–1.20)
GFR: 101.96 mL/min (ref >60.00)
Glucose, Bld: 84 mg/dL (ref 70–99)
Potassium: 3.7 mEq/L (ref 3.5–5.1)
Sodium: 137 mEq/L (ref 135–145)
Total Bilirubin: 0.5 mg/dL (ref 0.2–1.2)
Total Protein: 6.9 g/dL (ref 6.0–8.3)

## 2021-01-03 LAB — TSH: TSH: 1.4 u[IU]/mL (ref 0.35–5.50)

## 2021-01-03 LAB — HEMOGLOBIN A1C: Hgb A1c MFr Bld: 5.2 % (ref 4.6–6.5)

## 2021-01-03 LAB — C-REACTIVE PROTEIN: CRP: <1 mg/dL (ref 0.5–20.0)

## 2021-01-04 NOTE — Progress Notes (Signed)
Please inform patient of the following:  HEr cholesterol and A1c levels are normal.  Alison Fleming M. Jerline Pain, MD 01/04/2021 3:42 PM

## 2021-01-05 ENCOUNTER — Other Ambulatory Visit: Payer: 59

## 2021-01-05 DIAGNOSIS — K5180 Other ulcerative colitis without complications: Secondary | ICD-10-CM

## 2021-01-08 ENCOUNTER — Encounter: Payer: Self-pay | Admitting: Gastroenterology

## 2021-01-08 LAB — CALPROTECTIN, FECAL: Calprotectin, Fecal: 94 ug/g (ref 0–120)

## 2021-01-27 ENCOUNTER — Encounter: Payer: Self-pay | Admitting: Gastroenterology

## 2021-01-27 ENCOUNTER — Ambulatory Visit (INDEPENDENT_AMBULATORY_CARE_PROVIDER_SITE_OTHER): Payer: 59 | Admitting: Gastroenterology

## 2021-01-27 VITALS — BP 90/60 | HR 60 | Ht 66.0 in | Wt 158.0 lb

## 2021-01-27 DIAGNOSIS — K5180 Other ulcerative colitis without complications: Secondary | ICD-10-CM

## 2021-01-27 DIAGNOSIS — R195 Other fecal abnormalities: Secondary | ICD-10-CM

## 2021-01-27 NOTE — Progress Notes (Signed)
Alison Fleming    [id number]    1975/09/05  Primary Care Physician:Parker, Algis Greenhouse, MD  Referring Physician: Vivi Barrack, MD [address] [place],  [place] [address]   Chief complaint:  Ulcerative colitis  HPI: 46 year old very pleasant female here for follow-up for ulcerative colitis   She is no longer having rectal bleeding or diarrhea.She is currently on mesalamine 4.8 g daily She is having semiformed stool, on average 0-2 bowel movements per day.  She has occasional urgency anorectal area but it usually subsides on its own in few minutes No abdominal pain, nausea or vomiting.   Colonoscopy May 17, 2020 for rectal bleeding and change in bowel habits - One 5 mm polyp in the cecum, removed with a cold snare. Resected and retrieved. - Erythematous mucosa in the rectum, in the sigmoid colon, in the descending colon, in the transverse colon, in the ascending colon and in the cecum. Biopsied. - Scar in the rectum. Biopsied. - Non-bleeding internal hemorrhoid   1. Surgical [P], colon, cecum, polyp (1) - SESSILE SERRATED POLYP WITHOUT CYTOLOGIC DYSPLASIA (ONE). 2. Surgical [P], right colon -mild, patchy erythema - FOCAL ACTIVE COLITIS WITH MILD CHRONIC CHANGES. - NO GRANULOMAS OR DYSPLASIA. - SEE MICROSCOPIC DESCRIPTION. 3. Surgical [P], left colon-erythema, proctis - FOCAL ACTIVE COLITIS WITH MINIMAL CHRONIC CHANGES. - NO GRANULOMAS OR DYSPLASIA. - SEE MICROSCOPIC DESCRIPTION.   Colonoscopy November 28, 2017 for change in bowel habits - A single erosion in the terminal ileum. Biopsied. - Patchy mild inflammation was found in the rectum secondary to proctitis. Biopsied. - The entire examined colon is normal. Biopsied. - Non-bleeding internal hemorrhoids.   1. Surgical [P], terminal ileum - ILEAL MUCOSA WITH NO SPECIFIC HISTOPATHOLOGIC CHANGES. SEE NOTE. 2. Surgical [P], right colon - COLONIC MUCOSA WITH NO SPECIFIC HISTOPATHOLOGIC CHANGES. SEE NOTE. 3.  Surgical [P], left colon BX - COLONIC MUCOSA WITH NO SPECIFIC HISTOPATHOLOGIC CHANGES. SEE NOTE. 4. Surgical [P], rectum - RECTAL MUCOSA WITH NO SPECIFIC HISTOPATHOLOGIC CHANGES. SEE NOTE.   1. -4. The ileocolonic biopsies are negative for acute inflammation, features of chronicity or granulomas.   EGD March 08, 2017: Normal   Abdominal ultrasound January 03, 2017: Normal    Outpatient Encounter Medications as of 01/27/2021  Medication Sig   COLLAGEN PO Take 2,000 mg by mouth daily.   CRANBERRY PO Take 1 capsule by mouth once a week.   cyclobenzaprine (FLEXERIL) 10 MG tablet Take 1 tablet (10 mg total) by mouth 3 (three) times daily as needed for muscle spasms.   mesalamine (LIALDA) 1.2 g EC tablet TAKE 4 TABLETS BY MOUTH DAILY WITH BREAKFAST.   MICROGESTIN 1-20 MG-MCG tablet Take 1 tablet by mouth daily.   Multiple Vitamins-Minerals (MULTIVITAMIN GUMMIES WOMENS PO) Take 2 tablets by mouth daily.   omeprazole (PRILOSEC) 40 MG capsule TAKE 1 CAPSULE BY MOUTH EVERY DAY   valACYclovir (VALTREX) 1000 MG tablet TAKE 2 TABLETS X 1 AND REPEAT IN 12 HOURS WITH SYMPTOM ONSET.   No facility-administered encounter medications on file as of 01/27/2021.    Allergies as of 01/27/2021   (No Known Allergies)    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   Anxiety    Atypical chest pain 04/24/2017   Dyspepsia, s/p normal EGD 03/08/17, with recommendation for lifetime anti-reflux regimen 03/26/2017   GAD (generalized anxiety disorder) 05/10/2016   GERD (gastroesophageal reflux disease)    History of spinal fusion  for scoliosis 03/26/2017   HSV infection    Hyperlipidemia    Left upper quadrant pain 03/26/2017   Malaise and fatigue 03/26/2017   Paresthesias/numbness 04/24/2017   Polyarthralgia 03/28/2017   Positive Lyme disease serology    Seasonal allergies    Tension headache 03/26/2017    Past Surgical History:  Procedure Laterality Date   COLONOSCOPY   2019   DILATATION & CURETTAGE/HYSTEROSCOPY WITH MYOSURE N/A 07/13/2020   Procedure: [place];  Surgeon: Megan Salon, MD;  Location: [facility];  Service: Gynecology;  Laterality: N/A;   DILATATION & CURRETTAGE/HYSTEROSCOPY WITH RESECTOCOPE  02/12/2012   Procedure: [place];  Surgeon: Azalia Bilis, MD;  Location: [facility];  Service: Gynecology;  Laterality: N/A;   DILATION AND CURETTAGE OF UTERUS  2010   DILATION AND CURETTAGE OF UTERUS  2012   SPINAL FUSION  2008    Family History  Problem Relation Age of Onset   Diabetes Maternal Grandmother    Hypertension Maternal Grandmother    Heart disease Maternal Grandmother    Heart attack Maternal Grandmother    Osteoporosis Maternal Grandmother    Diabetes Maternal Grandfather    Hypertension Maternal Grandfather    Heart disease Maternal Grandfather    Heart attack Maternal Grandfather    Hypertension Mother    Anxiety disorder Mother    Diabetes Mother    Heart murmur Brother    Colon cancer Neg Hx    Esophageal cancer Neg Hx    Rectal cancer Neg Hx    Stomach cancer Neg Hx    Breast cancer Neg Hx    Colon polyps Neg Hx     Social History   Socioeconomic History   Marital status: Married    Spouse name: Not on file   Number of children: 2   Years of education: Not on file   Highest education level: Not on file  Occupational History   Occupation: HUMAN RESOURCES    Employer: [employer]  Tobacco Use   Smoking status: Never   Smokeless tobacco: Never  Vaping Use   Vaping Use: Never used  Substance and Sexual Activity   Alcohol use: Yes    Alcohol/week: 5.0 - 6.0 standard drinks    Types: 5 - 6 Standard drinks or equivalent per week   Drug use: No   Sexual activity: Yes    Partners: Male    Birth control/protection: OCP  Other Topics Concern   Not on file  Social History Narrative   Not on file   Social  Determinants of Health   Financial Resource Strain: Not on file  Food Insecurity: Not on file  Transportation Needs: Not on file  Physical Activity: Not on file  Stress: Not on file  Social Connections: Not on file  Intimate Partner Violence: Not on file      Review of systems: All other review of systems negative except as mentioned in the HPI.   Physical Exam: Vitals:   01/27/21 0829  BP: 90/60  Pulse: 60   Body mass index is 25.5 kg/m. Gen:      No acute distress HEENT:  sclera anicteric Abd:      soft, non-tender; no palpable masses, no distension Ext:    No edema Neuro: alert and oriented x 3 Psych: normal mood and affect  Data Reviewed:  Reviewed labs, radiology imaging, old records and pertinent past GI work up   Assessment and Plan/Recommendations:  46 year old very pleasant female with history of ulcerative colitis, currently in clinical remission after course of budesonide and on mesalamine daily  Continue Lialda 4.8 g daily She has high out-of-pocket expense with deductible, advised patient to call if other mesalamine formulations are better covered so we can switch the prescription.  Change in stool consistency: Use Benefiber 1 tablespoon twice daily with meals Use Kefir probiotic yogurt drink daily Use probiotic VSL3 1 capsule twice daily for 2 to 4 weeks   We will plan to monitor closely her symptoms.  Follow-up CBC, CMP ESR and fecal calprotectin in 6 months Advised patient to call with any change in symptoms  Return in 6 months  This visit required 30 minutes of patient care (this includes precharting, chart review, review of results, face-to-face time used for counseling as well as treatment plan and follow-up. The patient was provided an opportunity to ask questions and all were answered. The patient agreed with the plan and demonstrated an understanding of the instructions.  Damaris Hippo , MD    CC: Vivi Barrack, MD

## 2021-01-27 NOTE — Patient Instructions (Signed)
Use Benefiber 1 tablespoon twice a day  Take VSL3 1 capsule twice daily for 2-4 weeks  Kefir yogurt drink daily   If you are age 45 or older, your body mass index should be between 23-30. Your Body mass index is 25.5 kg/m. If this is out of the aforementioned range listed, please consider follow up with your Primary Care Provider.  If you are age 38 or younger, your body mass index should be between 19-25. Your Body mass index is 25.5 kg/m. If this is out of the aformentioned range listed, please consider follow up with your Primary Care Provider.   ________________________________________________________  The [place] [facility] providers would like to encourage you to use [facility] to communicate with providers for non-urgent requests or questions.  Due to long hold times on the telephone, sending your provider a message by [facility] may be a faster and more efficient way to get a response.  Please allow 48 business hours for a response.  Please remember that this is for non-urgent requests.  _______________________________________________________   I appreciate the  opportunity to care for you  Thank You   Harl Bowie , MD

## 2021-02-17 ENCOUNTER — Encounter: Payer: Self-pay | Admitting: Family Medicine

## 2021-02-17 ENCOUNTER — Other Ambulatory Visit: Payer: Self-pay

## 2021-02-17 ENCOUNTER — Ambulatory Visit (INDEPENDENT_AMBULATORY_CARE_PROVIDER_SITE_OTHER): Payer: 59 | Admitting: Family Medicine

## 2021-02-17 VITALS — BP 109/74 | HR 93 | Temp 98.2°F | Ht 66.0 in | Wt 160.4 lb

## 2021-02-17 DIAGNOSIS — K5180 Other ulcerative colitis without complications: Secondary | ICD-10-CM

## 2021-02-17 DIAGNOSIS — H9209 Otalgia, unspecified ear: Secondary | ICD-10-CM

## 2021-02-17 DIAGNOSIS — G44209 Tension-type headache, unspecified, not intractable: Secondary | ICD-10-CM | POA: Diagnosis not present

## 2021-02-17 MED ORDER — AZITHROMYCIN 250 MG PO TABS: ORAL_TABLET | ORAL | 0 refills | Status: DC

## 2021-02-17 MED ORDER — CYCLOBENZAPRINE HCL 10 MG PO TABS: 10.0000 mg | ORAL_TABLET | Freq: Three times a day (TID) | ORAL | 1 refills | Status: DC | PRN

## 2021-02-17 NOTE — Progress Notes (Signed)
° °  Alison Fleming is a 46 y.o. female who presents today for an office visit.  Assessment/Plan:  New/Acute Problems: Otalgia Unable to clearly visualize right TM due to cerumen impaction.  We attempted to remove cerumen today however patient did not tolerate.  She elected to use her wax softeners at home.  She does have some lymphadenopathy and throat clearing.  She may have some early sinusitis.  She will try clearing out cerumen at home.  We will send in pocket prescription for azithromycin.  Encouraged hydration.  Discussed reasons to return to care.  Chronic Problems Addressed Today: Right sided ulcerative colitis ([place]) Doing much better on mesalamine per GI.  Tension headache No red flags.  Takes Flexeril as needed sparingly which works well.  Will refill today.     Subjective:  HPI:  Patient here with concern for possible ear infection. She is experiencing ear pain for the past several days. Located on right ear. She notes sometimes she have pain in both ear. Symptoms seems to be worsening. She is having difficulty clearing her throat for the past 4 days. She notes she has noticed some swelling in her lymph nodes. She report right side hurts more.  No sore throat. Denies nasal congestion or drainage. Denies difficulty hearing. Denies itching or pressure around the ear.   She has had issue bowel movements in the past. She is on Mesalamine 4.8 daily. She follows up with gastroenterologist for ulcerative colitis. No nausea or vomiting. Denies any abdominal pain.       Objective:  Physical Exam: BP 109/74 (BP Location: Left Arm)    Pulse 93    Temp 98.2 F (36.8 C) (Temporal)    Ht 5' 6"  (1.676 m)    Wt 160 lb 6.4 oz (72.8 kg)    LMP 02/15/2021    SpO2 97%    BMI 25.89 kg/m   Gen: No acute distress, resting comfortably HEENT: Left TM clear.  Right EAC obstructed by cerumen.  Lymphadenopathy noted.  OP clear.  Nasal mucosa boggy and edematous with clear discharge. CV: Regular  rate and rhythm with no murmurs appreciated Pulm: Normal work of breathing, clear to auscultation bilaterally with no crackles, wheezes, or rhonchi Neuro: Grossly normal, moves all extremities Psych: Normal affect and thought content       I,Savera Zaman,acting as a scribe for Dimas Chyle, MD.,have documented all relevant documentation on the behalf of Dimas Chyle, MD,as directed by  Dimas Chyle, MD while in the presence of Dimas Chyle, MD.   I, Dimas Chyle, MD, have reviewed all documentation for this visit. The documentation on 02/17/21 for the exam, diagnosis, procedures, and orders are all accurate and complete.  Algis Greenhouse. Jerline Pain, MD 02/17/2021 3:03 PM

## 2021-02-17 NOTE — Patient Instructions (Signed)
It was very nice to see you today!  Please try using Debrox to help prevent earwax buildup in your ears.  Start the azithromycin.  Let me know if not improving by next week.  Take care, Dr Jerline Pain  PLEASE NOTE:  If you had any lab tests please let us know if you have not heard back within a few days. You may see your results on mychart before we have a chance to review them but we will give you a call once they are reviewed by [place]. If we ordered any referrals today, please let us know if you have not heard from their office within the next week.   Please try these tips to maintain a healthy lifestyle:  Eat at least 3 REAL meals and 1-2 snacks per day.  Aim for no more than 5 hours between eating.  If you eat breakfast, please do so within one hour of getting up.   Each meal should contain half fruits/vegetables, one quarter protein, and one quarter carbs (no bigger than a computer mouse)  Cut down on sweet beverages. This includes juice, soda, and sweet tea.   Drink at least 1 glass of water with each meal and aim for at least 8 glasses per day  Exercise at least 150 minutes every week.

## 2021-02-17 NOTE — Assessment & Plan Note (Signed)
No red flags.  Takes Flexeril as needed sparingly which works well.  Will refill today.

## 2021-02-17 NOTE — Assessment & Plan Note (Signed)
Doing much better on mesalamine per GI.

## 2021-02-22 ENCOUNTER — Ambulatory Visit: Payer: 59 | Admitting: Gastroenterology

## 2021-02-22 ENCOUNTER — Ambulatory Visit: Payer: 59

## 2021-03-10 ENCOUNTER — Ambulatory Visit
Admission: RE | Admit: 2021-03-10 | Discharge: 2021-03-10 | Disposition: A | Discharge status: Home / Self Care | Payer: 59 | Source: Ambulatory Visit | Attending: Obstetrics & Gynecology | Admitting: Obstetrics & Gynecology

## 2021-03-10 DIAGNOSIS — Z1231 Encounter for screening mammogram for malignant neoplasm of breast: Secondary | ICD-10-CM

## 2021-04-25 ENCOUNTER — Other Ambulatory Visit: Payer: Self-pay | Admitting: Gastroenterology

## 2021-04-25 ENCOUNTER — Other Ambulatory Visit: Payer: Self-pay | Admitting: Family Medicine

## 2021-05-02 ENCOUNTER — Encounter (HOSPITAL_BASED_OUTPATIENT_CLINIC_OR_DEPARTMENT_OTHER): Payer: Self-pay | Admitting: Obstetrics & Gynecology

## 2021-05-02 ENCOUNTER — Other Ambulatory Visit (HOSPITAL_COMMUNITY)
Admission: RE | Admit: 2021-05-02 | Discharge: 2021-05-02 | Disposition: A | Discharge status: Home / Self Care | Payer: 59 | Source: Ambulatory Visit | Attending: Obstetrics & Gynecology | Admitting: Obstetrics & Gynecology

## 2021-05-02 ENCOUNTER — Ambulatory Visit (INDEPENDENT_AMBULATORY_CARE_PROVIDER_SITE_OTHER): Payer: 59 | Admitting: Obstetrics & Gynecology

## 2021-05-02 VITALS — BP 115/81 | HR 79 | Ht 66.5 in | Wt 159.8 lb

## 2021-05-02 DIAGNOSIS — Z9889 Other specified postprocedural states: Secondary | ICD-10-CM | POA: Diagnosis not present

## 2021-05-02 DIAGNOSIS — Z01419 Encounter for gynecological examination (general) (routine) without abnormal findings: Secondary | ICD-10-CM | POA: Diagnosis not present

## 2021-05-02 DIAGNOSIS — B001 Herpesviral vesicular dermatitis: Secondary | ICD-10-CM | POA: Diagnosis not present

## 2021-05-02 DIAGNOSIS — N926 Irregular menstruation, unspecified: Secondary | ICD-10-CM | POA: Diagnosis not present

## 2021-05-02 DIAGNOSIS — B009 Herpesviral infection, unspecified: Secondary | ICD-10-CM

## 2021-05-02 DIAGNOSIS — Z124 Encounter for screening for malignant neoplasm of cervix: Secondary | ICD-10-CM

## 2021-05-02 MED ORDER — MICROGESTIN 1/20 1-20 MG-MCG PO TABS: 1.0000 | ORAL_TABLET | Freq: Every day | ORAL | 4 refills | Status: DC

## 2021-05-02 MED ORDER — VALACYCLOVIR HCL 1 G PO TABS: ORAL_TABLET | ORAL | 3 refills | Status: DC

## 2021-05-02 NOTE — Progress Notes (Signed)
46 y.o. Alison Fleming Married White or Caucasian female here for annual exam.  Cycles are much improved.  She is on micronor.  Has minimal spotting.  Still having monthly cycles.  Last cycle was light.  This is not always the case.   ? ?Patient's last menstrual period was 04/12/2021.          ?Sexually active: Yes.    ?The current method of family planning is OCP (estrogen/progesterone).    ?Smoker:  no ? ?Health Maintenance: ?Pap:  04/26/2020 Negative ?History of abnormal Pap:  no ?MMG:  03/10/2021 Negative ?Colonoscopy:  05/17/2020 ?BMD:   not indicated ?Screening Labs: done December 2022 with Dr. Jerline Pain ? ? reports that she has never smoked. She has never used smokeless tobacco. She reports current alcohol use of about 5.0 - 6.0 standard drinks per week. She reports that she does not use drugs. ? ?Past Medical History:  ?Diagnosis Date  ? Adjustment insomnia 03/26/2017  ? Anxiety   ? Atypical chest pain 04/24/2017  ? Dyspepsia, s/p normal EGD 03/08/17, with recommendation for lifetime anti-reflux regimen 03/26/2017  ? GAD (generalized anxiety disorder) 05/10/2016  ? GERD (gastroesophageal reflux disease)   ? History of spinal fusion for scoliosis 03/26/2017  ? HSV infection   ? Hyperlipidemia   ? Left upper quadrant pain 03/26/2017  ? Malaise and fatigue 03/26/2017  ? Paresthesias/numbness 04/24/2017  ? Polyarthralgia 03/28/2017  ? Positive Lyme disease serology   ? Seasonal allergies   ? Tension headache 03/26/2017  ? ? ?Past Surgical History:  ?Procedure Laterality Date  ? COLONOSCOPY  2019  ? DILATATION & CURETTAGE/HYSTEROSCOPY WITH MYOSURE N/A 07/13/2020  ? Procedure: [place];  Surgeon: Megan Salon, MD;  Location: [facility];  Service: Gynecology;  Laterality: N/A;  ? DILATATION & CURRETTAGE/HYSTEROSCOPY WITH RESECTOCOPE  02/12/2012  ? Procedure: [place];  Surgeon: Azalia Bilis, MD;  Location: [facility];  Service: Gynecology;   Laterality: N/A;  ? [place] OF UTERUS  2010  ? DILATION AND CURETTAGE OF UTERUS  2012  ? SPINAL FUSION  2008  ? ? ?Current Outpatient Medications  ?Medication Sig Dispense Refill  ? COLLAGEN PO Take 2,000 mg by mouth daily.    ? CRANBERRY PO Take 1 capsule by mouth once a week.    ? cyclobenzaprine (FLEXERIL) 10 MG tablet Take 1 tablet (10 mg total) by mouth 3 (three) times daily as needed for muscle spasms. 30 tablet 1  ? mesalamine (LIALDA) 1.2 g EC tablet TAKE 4 TABLETS BY MOUTH DAILY WITH BREAKFAST. 120 tablet 2  ? MICROGESTIN 1-20 MG-MCG tablet Take 1 tablet by mouth daily. 84 tablet 3  ? Multiple Vitamins-Minerals (MULTIVITAMIN GUMMIES WOMENS PO) Take 2 tablets by mouth daily.    ? omeprazole (PRILOSEC) 40 MG capsule TAKE 1 CAPSULE BY MOUTH EVERY DAY 30 capsule 2  ? valACYclovir (VALTREX) 1000 MG tablet TAKE 2 TABLETS X 1 AND REPEAT IN 12 HOURS WITH SYMPTOM ONSET. 30 tablet 3  ? ?No current facility-administered medications for this visit.  ? ? ?Family History  ?Problem Relation Age of Onset  ? Diabetes Maternal Grandmother   ? Hypertension Maternal Grandmother   ? Heart disease Maternal Grandmother   ? Heart attack Maternal Grandmother   ? Osteoporosis Maternal Grandmother   ? Diabetes Maternal Grandfather   ? Hypertension Maternal Grandfather   ? Heart disease Maternal Grandfather   ? Heart attack Maternal Grandfather   ? Hypertension Mother   ?  Anxiety disorder Mother   ? Diabetes Mother   ? Heart murmur Brother   ? Colon cancer Neg Hx   ? Esophageal cancer Neg Hx   ? Rectal cancer Neg Hx   ? Stomach cancer Neg Hx   ? Breast cancer Neg Hx   ? Colon polyps Neg Hx   ? ? ?Review of Systems  ?All other systems reviewed and are negative. ? ?Exam:   ?BP 115/81 (BP Location: Right Arm, Patient Position: Sitting, Cuff Size: Normal)   Pulse 79   Ht 5' 6.5" (1.689 m) Comment: reported  Wt 159 lb 12.8 oz (72.5 kg)   LMP 04/12/2021   BMI 25.41 kg/m?   Height: 5' 6.5" (168.9 cm) (reported) ? ?General  appearance: alert, cooperative and appears stated age ?Head: Normocephalic, without obvious abnormality, atraumatic ?Neck: no adenopathy, supple, symmetrical, trachea midline and thyroid normal to inspection and palpation ?Lungs: clear to auscultation bilaterally ?Breasts: normal appearance, no masses or tenderness ?Heart: regular rate and rhythm ?Abdomen: soft, non-tender; bowel sounds normal; no masses,  no organomegaly ?Extremities: extremities normal, atraumatic, no cyanosis or edema ?Skin: Skin color, texture, turgor normal. No rashes or lesions ?Lymph nodes: Cervical, supraclavicular, and axillary nodes normal. ?No abnormal inguinal nodes palpated ?Neurologic: Grossly normal ? ? ?Pelvic: External genitalia:  no lesions ?             Urethra:  normal appearing urethra with no masses, tenderness or lesions ?             Bartholins and Skenes: normal    ?             Vagina: normal appearing vagina with normal color and no discharge, no lesions ?             Cervix: no lesions ?             Pap taken: Yes.   ?Bimanual Exam:  Uterus:  normal size, contour, position, consistency, mobility, non-tender ?             Adnexa: no mass, fullness, tenderness ?              Rectovaginal: Confirms ?              Anus:  normal sphincter tone, no lesions ? ?Chaperone, Octaviano Batty, CMA, was present for exam. ? ?Assessment/Plan: ?1. Well woman exam with routine gynecological exam ?- Pap smear obtained today per pt request.  HR HPV will be repeated next year ?- Mammogram 02/2021 ?- Colonoscopy 2022 ?- Bone mineral density not indicated ?- lab work done done with PCP ?- vaccines reviewed/updated ? ?2. Irregular bleeding ?- much improved ? ?3. History of hysteroscopy ? ?4. Recurrent cold sores ?- valACYclovir (VALTREX) 1000 MG tablet; Take 2 tabs po x 1, repeat in 12 hours with symptom onset.  Dispense: 10 tablet; Refill: 3 ? ?5. HSV-1 infection ?- valACYclovir (VALTREX) 1000 MG tablet; Take 2 tabs po x 1, repeat in 12 hours with  symptom onset.  Dispense: 10 tablet; Refill: 3 ? ? ? ?

## 2021-05-03 LAB — CYTOLOGY - PAP: Diagnosis: NEGATIVE

## 2021-06-15 ENCOUNTER — Encounter: Payer: Self-pay | Admitting: Gastroenterology

## 2021-06-24 ENCOUNTER — Other Ambulatory Visit: Payer: Self-pay

## 2021-06-24 DIAGNOSIS — K51018 Ulcerative (chronic) pancolitis with other complication: Secondary | ICD-10-CM

## 2021-07-14 ENCOUNTER — Other Ambulatory Visit (INDEPENDENT_AMBULATORY_CARE_PROVIDER_SITE_OTHER): Payer: 59

## 2021-07-14 DIAGNOSIS — K51018 Ulcerative (chronic) pancolitis with other complication: Secondary | ICD-10-CM | POA: Diagnosis not present

## 2021-07-14 LAB — CBC WITH DIFFERENTIAL/PLATELET
Basophils Absolute: 0.1 10*3/uL (ref 0.0–0.1)
Basophils Relative: 0.9 % (ref 0.0–3.0)
Eosinophils Absolute: 0.1 10*3/uL (ref 0.0–0.7)
Eosinophils Relative: 1.8 % (ref 0.0–5.0)
HCT: 38.2 % (ref 36.0–46.0)
Hemoglobin: 13.2 g/dL (ref 12.0–15.0)
Lymphocytes Relative: 35.1 % (ref 12.0–46.0)
Lymphs Abs: 2.4 10*3/uL (ref 0.7–4.0)
MCHC: 34.6 g/dL (ref 30.0–36.0)
MCV: 89.1 fl (ref 78.0–100.0)
Monocytes Absolute: 0.5 10*3/uL (ref 0.1–1.0)
Monocytes Relative: 8.1 % (ref 3.0–12.0)
Neutro Abs: 3.7 10*3/uL (ref 1.4–7.7)
Neutrophils Relative %: 54.1 % (ref 43.0–77.0)
Platelets: 285 10*3/uL (ref 150.0–400.0)
RBC: 4.29 Mil/uL (ref 3.87–5.11)
RDW: 12.5 % (ref 11.5–15.5)
WBC: 6.8 10*3/uL (ref 4.0–10.5)

## 2021-07-14 LAB — COMPREHENSIVE METABOLIC PANEL
ALT: 17 U/L (ref 0–35)
AST: 17 U/L (ref 0–37)
Albumin: 4.8 g/dL (ref 3.5–5.2)
Alkaline Phosphatase: 44 U/L (ref 39–117)
BUN: 11 mg/dL (ref 6–23)
CO2: 25 mEq/L (ref 19–32)
Calcium: 9.7 mg/dL (ref 8.4–10.5)
Chloride: 100 mEq/L (ref 96–112)
Creatinine, Ser: 0.9 mg/dL (ref 0.40–1.20)
GFR: 77.72 mL/min (ref >60.00)
Glucose, Bld: 96 mg/dL (ref 70–99)
Potassium: 3.7 mEq/L (ref 3.5–5.1)
Sodium: 136 mEq/L (ref 135–145)
Total Bilirubin: 0.7 mg/dL (ref 0.2–1.2)
Total Protein: 7.9 g/dL (ref 6.0–8.3)

## 2021-07-14 LAB — HIGH SENSITIVITY CRP: CRP, High Sensitivity: 3.02 mg/L (ref 0.000–5.000)

## 2021-07-14 LAB — SEDIMENTATION RATE: Sed Rate: 12 mm/hr (ref 0–20)

## 2021-07-20 LAB — CALPROTECTIN, FECAL: Calprotectin, Fecal: <5 ug/g (ref 0–120)

## 2021-07-25 ENCOUNTER — Encounter: Payer: Self-pay | Admitting: Gastroenterology

## 2021-07-25 ENCOUNTER — Ambulatory Visit (INDEPENDENT_AMBULATORY_CARE_PROVIDER_SITE_OTHER): Payer: 59 | Admitting: Gastroenterology

## 2021-07-25 VITALS — BP 108/56 | HR 62 | Ht 66.5 in | Wt 154.8 lb

## 2021-07-25 DIAGNOSIS — K5180 Other ulcerative colitis without complications: Secondary | ICD-10-CM | POA: Diagnosis not present

## 2021-07-25 NOTE — Progress Notes (Signed)
Alison Fleming    [id number]    04-27-1975  Primary Care Physician:Parker, Algis Greenhouse, MD  Referring Physician: Vivi Barrack, MD [address] [place],   [id number]   Chief complaint: Ulcerative colitis  HPI:  46 year old very pleasant female here for follow-up for ulcerative colitis   She is no longer having rectal bleeding or diarrhea.She is currently on mesalamine 4.8 g daily She is having semiformed stool, on average 1-3 bowel movements per day.  She has occasional episodes of diarrhea once or twice a month, no identifiable triggers.  She takes probiotics occasionally.  No abdominal pain, nausea or vomiting.  No joint pains or skin rash.   Reviewed and discussed labs July 14, 2021: Fecal calprotectin undetectable.  Sed rate 12.  CBC and CMP within normal range  Colonoscopy May 17, 2020 for rectal bleeding and change in bowel habits - One 5 mm polyp in the cecum, removed with a cold snare. Resected and retrieved. - Erythematous mucosa in the rectum, in the sigmoid colon, in the descending colon, in the transverse colon, in the ascending colon and in the cecum. Biopsied. - Scar in the rectum. Biopsied. - Non-bleeding internal hemorrhoid   1. Surgical [P], colon, cecum, polyp (1) - SESSILE SERRATED POLYP WITHOUT CYTOLOGIC DYSPLASIA (ONE). 2. Surgical [P], right colon -mild, patchy erythema - FOCAL ACTIVE COLITIS WITH MILD CHRONIC CHANGES. - NO GRANULOMAS OR DYSPLASIA. - SEE MICROSCOPIC DESCRIPTION. 3. Surgical [P], left colon-erythema, proctis - FOCAL ACTIVE COLITIS WITH MINIMAL CHRONIC CHANGES. - NO GRANULOMAS OR DYSPLASIA. - SEE MICROSCOPIC DESCRIPTION.   Colonoscopy November 28, 2017 for change in bowel habits - A single erosion in the terminal ileum. Biopsied. - Patchy mild inflammation was found in the rectum secondary to proctitis. Biopsied. - The entire examined colon is normal. Biopsied. - Non-bleeding internal hemorrhoids.   1. Surgical [P],  terminal ileum - ILEAL MUCOSA WITH NO SPECIFIC HISTOPATHOLOGIC CHANGES. SEE NOTE. 2. Surgical [P], right colon - COLONIC MUCOSA WITH NO SPECIFIC HISTOPATHOLOGIC CHANGES. SEE NOTE. 3. Surgical [P], left colon BX - COLONIC MUCOSA WITH NO SPECIFIC HISTOPATHOLOGIC CHANGES. SEE NOTE. 4. Surgical [P], rectum - RECTAL MUCOSA WITH NO SPECIFIC HISTOPATHOLOGIC CHANGES. SEE NOTE.   1. -4. The ileocolonic biopsies are negative for acute inflammation, features of chronicity or granulomas.   EGD March 08, 2017: Normal   Abdominal ultrasound January 03, 2017: Normal   Outpatient Encounter Medications as of 07/25/2021  Medication Sig   COLLAGEN PO Take 2,000 mg by mouth daily.   CRANBERRY PO Take 1 capsule by mouth once a week.   cyclobenzaprine (FLEXERIL) 10 MG tablet Take 1 tablet (10 mg total) by mouth 3 (three) times daily as needed for muscle spasms.   mesalamine (LIALDA) 1.2 g EC tablet TAKE 4 TABLETS BY MOUTH DAILY WITH BREAKFAST.   MICROGESTIN 1-20 MG-MCG tablet Take 1 tablet by mouth daily.   Multiple Vitamins-Minerals (MULTIVITAMIN GUMMIES WOMENS PO) Take 2 tablets by mouth daily.   omeprazole (PRILOSEC) 40 MG capsule TAKE 1 CAPSULE BY MOUTH EVERY DAY   valACYclovir (VALTREX) 1000 MG tablet Take 2 tabs po x 1, repeat in 12 hours with symptom onset.   No facility-administered encounter medications on file as of 07/25/2021.    Allergies as of 07/25/2021   (No Known Allergies)    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   Anxiety    Atypical chest pain 04/24/2017  Dyspepsia, s/p normal EGD 03/08/17, with recommendation for lifetime anti-reflux regimen 03/26/2017   GAD (generalized anxiety disorder) 05/10/2016   GERD (gastroesophageal reflux disease)    History of spinal fusion for scoliosis 03/26/2017   HSV infection    Hyperlipidemia    Left upper quadrant pain 03/26/2017   Malaise and fatigue 03/26/2017   Paresthesias/numbness 04/24/2017    Polyarthralgia 03/28/2017   Positive Lyme disease serology    Seasonal allergies    Tension headache 03/26/2017    Past Surgical History:  Procedure Laterality Date   COLONOSCOPY  2019   DILATATION & CURETTAGE/HYSTEROSCOPY WITH MYOSURE N/A 07/13/2020   Procedure: [place];  Surgeon: Megan Salon, MD;  Location: [facility];  Service: Gynecology;  Laterality: N/A;   DILATATION & CURRETTAGE/HYSTEROSCOPY WITH RESECTOCOPE  02/12/2012   Procedure: [place];  Surgeon: Azalia Bilis, MD;  Location: [facility];  Service: Gynecology;  Laterality: N/A;   DILATION AND CURETTAGE OF UTERUS  2010   DILATION AND CURETTAGE OF UTERUS  2012   SPINAL FUSION  2008    Family History  Problem Relation Age of Onset   Diabetes Maternal Grandmother    Hypertension Maternal Grandmother    Heart disease Maternal Grandmother    Heart attack Maternal Grandmother    Osteoporosis Maternal Grandmother    Diabetes Maternal Grandfather    Hypertension Maternal Grandfather    Heart disease Maternal Grandfather    Heart attack Maternal Grandfather    Hypertension Mother    Anxiety disorder Mother    Diabetes Mother    Heart murmur Brother    Colon cancer Neg Hx    Esophageal cancer Neg Hx    Rectal cancer Neg Hx    Stomach cancer Neg Hx    Breast cancer Neg Hx    Colon polyps Neg Hx     Social History   Socioeconomic History   Marital status: Married    Spouse name: Not on file   Number of children: 2   Years of education: Not on file   Highest education level: Not on file  Occupational History   Occupation: HUMAN RESOURCES    Employer: [employer]  Tobacco Use   Smoking status: Never   Smokeless tobacco: Never  Vaping Use   Vaping Use: Never used  Substance and Sexual Activity   Alcohol use: Yes    Alcohol/week: 5.0 - 6.0 standard drinks of alcohol    Types: 5 - 6 Standard drinks or equivalent per  week   Drug use: No   Sexual activity: Yes    Partners: Male    Birth control/protection: OCP  Other Topics Concern   Not on file  Social History Narrative   Not on file   Social Determinants of Health   Financial Resource Strain: Not on file  Food Insecurity: Not on file  Transportation Needs: Not on file  Physical Activity: Not on file  Stress: Not on file  Social Connections: Not on file  Intimate Partner Violence: Not on file      Review of systems: All other review of systems negative except as mentioned in the HPI.   Physical Exam: Vitals:   07/25/21 0832  BP: (!) 108/56  Pulse: 62   Body mass index is 24.61 kg/m. Gen:      No acute distress HEENT:  sclera anicteric Abd:      soft, non-tender; no palpable masses, no distension Ext:  No edema Neuro: alert and oriented x 3 Psych: normal mood and affect  Data Reviewed:  Reviewed labs, radiology imaging, old records and pertinent past GI work up   Assessment and Plan/Recommendations:  46 year old very pleasant female with history of ulcerative colitis, currently in clinical remission after course of budesonide and currently on mesalamine daily for maintenance   We will plan to decrease Lialda to 2.4 g daily.  Advised patient to decrease to 3 capsules daily for a week and then start taking 2 capsules daily    Change in stool consistency: Advised patient to increase dietary fiber and water intake.  Use Benefiber or psyllium husk capsule 1-2 daily as needed   IBD health maintenance up-to-date  Return in 6 months   The patient was provided an opportunity to ask questions and all were answered. The patient agreed with the plan and demonstrated an understanding of the instructions.  Damaris Hippo , MD    CC: Vivi Barrack, MD

## 2021-07-25 NOTE — Patient Instructions (Addendum)
Lialda, try to decrease to 2 capsules daily  Use Benefiber or psyllium husk 1-2 capsules daily  Follow up in 6 months  If you are age 44 or older, your body mass index should be between 23-30. Your Body mass index is 24.61 kg/m. If this is out of the aforementioned range listed, please consider follow up with your Primary Care Provider.  If you are age 31 or younger, your body mass index should be between 19-25. Your Body mass index is 24.61 kg/m. If this is out of the aformentioned range listed, please consider follow up with your Primary Care Provider.   ________________________________________________________  The [facility] providers would like to encourage you to use [employer] to communicate with providers for non-urgent requests or questions.  Due to long hold times on the telephone, sending your provider a message by [facility] may be a faster and more efficient way to get a response.  Please allow 48 business hours for a response.  Please remember that this is for non-urgent requests.  _______________________________________________________ Meda Coffee appreciate the  opportunity to care for you  Thank You   Harl Bowie , MD

## 2021-07-27 ENCOUNTER — Other Ambulatory Visit: Payer: Self-pay | Admitting: Gastroenterology

## 2021-07-27 ENCOUNTER — Other Ambulatory Visit: Payer: Self-pay | Admitting: Family Medicine

## 2021-08-30 ENCOUNTER — Encounter: Payer: Self-pay | Admitting: Family Medicine

## 2021-08-30 ENCOUNTER — Ambulatory Visit (INDEPENDENT_AMBULATORY_CARE_PROVIDER_SITE_OTHER): Payer: 59 | Admitting: Family Medicine

## 2021-08-30 VITALS — BP 100/80 | HR 85 | Temp 98.1°F | Ht 66.5 in | Wt 155.8 lb

## 2021-08-30 DIAGNOSIS — Z13220 Encounter for screening for lipoid disorders: Secondary | ICD-10-CM

## 2021-08-30 DIAGNOSIS — K5180 Other ulcerative colitis without complications: Secondary | ICD-10-CM

## 2021-08-30 DIAGNOSIS — R739 Hyperglycemia, unspecified: Secondary | ICD-10-CM

## 2021-08-30 DIAGNOSIS — Z0001 Encounter for general adult medical examination with abnormal findings: Secondary | ICD-10-CM | POA: Diagnosis not present

## 2021-08-30 DIAGNOSIS — Z131 Encounter for screening for diabetes mellitus: Secondary | ICD-10-CM | POA: Diagnosis not present

## 2021-08-30 NOTE — Progress Notes (Signed)
Chief Complaint:  Alison Fleming is a 46 y.o. female who presents today for her annual comprehensive physical exam.    Assessment/Plan:  New/Acute Problems: Meralgia paresthetica No red flags.  We discussed conservative measures including trying to sleep on her back and avoiding pressure to the sides of her hips.  She will let us know if symptoms or not improving over the next few weeks.  Chronic Problems Addressed Today: Right sided ulcerative colitis ([place]) Stable on mesalamine per GI.  Preventative Healthcare: Up-to-date on screenings.  We will check labs today.  Patient Counseling(The following topics were reviewed and/or handout was given):  -Nutrition: Stressed importance of moderation in sodium/caffeine intake, saturated fat and cholesterol, caloric balance, sufficient intake of fresh fruits, vegetables, and fiber.  -Stressed the importance of regular exercise.   -Substance Abuse: Discussed cessation/primary prevention of tobacco, alcohol, or other drug use; driving or other dangerous activities under the influence; availability of treatment for abuse.   -Injury prevention: Discussed safety belts, safety helmets, smoke detector, smoking near bedding or upholstery.   -Sexuality: Discussed sexually transmitted diseases, partner selection, use of condoms, avoidance of unintended pregnancy and contraceptive alternatives.   -Dental health: Discussed importance of regular tooth brushing, flossing, and dental visits.  -Health maintenance and immunizations reviewed. Please refer to Health maintenance section.  Return to care in 1 year for next preventative visit.     Subjective:  HPI:  She has no acute complaints today.   Occasional pain in bilateral legs, right worse than left. Worse at night.  Started several weeks ago after being on a long car trip.  No specific treatments tried.  No other obvious aggravating or alleviating factors.  Lifestyle Diet: Balanced. Plenty of  fruits and vegetables.  Exercise: Likes walking and does work out [employer].      08/30/2021    8:32 AM  Depression screen PHQ 2/9  Decreased Interest 0  Down, Depressed, Hopeless 0  PHQ - 2 Score 0   ROS: Per HPI, otherwise a complete review of systems was negative.   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   Anxiety    Atypical chest pain 04/24/2017   Dyspepsia, s/p normal EGD 03/08/17, with recommendation for lifetime anti-reflux regimen 03/26/2017   GAD (generalized anxiety disorder) 05/10/2016   GERD (gastroesophageal reflux disease)    History of spinal fusion for scoliosis 03/26/2017   HSV infection    Hyperlipidemia    Left upper quadrant pain 03/26/2017   Malaise and fatigue 03/26/2017   Paresthesias/numbness 04/24/2017   Polyarthralgia 03/28/2017   Positive Lyme disease serology    Seasonal allergies    Tension headache 03/26/2017   Patient Active Problem List   Diagnosis Date Noted   Right sided ulcerative colitis ([place]) 07/22/2020   Irregular bleeding    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   Encounter for surveillance of contraceptive pills 04/26/2020   Inclusion cyst of vulva 04/26/2020   Paresthesias/numbness 04/24/2017   Polyarthralgia 03/28/2017   Dyspepsia, s/p normal EGD 03/08/17, with recommendation for lifetime anti-reflux regimen 03/26/2017   Cervical pain with history of scoliosis 03/26/2017   Malaise and fatigue 03/26/2017   Tension headache 03/26/2017   Past Surgical History:  Procedure Laterality Date   COLONOSCOPY  2019   DILATATION & CURETTAGE/HYSTEROSCOPY WITH MYOSURE N/A 07/13/2020   Procedure: [place];  Surgeon: Megan Salon, MD;  Location: [facility];  Service: Gynecology;  Laterality: N/A;   DILATATION & CURRETTAGE/HYSTEROSCOPY WITH RESECTOCOPE  02/12/2012   Procedure: [place];  Surgeon: Azalia Bilis, MD;  Location: [facility];  Service: Gynecology;  Laterality: N/A;   DILATION AND CURETTAGE OF UTERUS  2010   DILATION AND CURETTAGE OF UTERUS  2012   SPINAL FUSION  2008    Family History  Problem Relation Age of Onset   Diabetes Maternal Grandmother    Hypertension Maternal Grandmother    Heart disease Maternal Grandmother    Heart attack Maternal Grandmother    Osteoporosis Maternal Grandmother    Diabetes Maternal Grandfather    Hypertension Maternal Grandfather    Heart disease Maternal Grandfather    Heart attack Maternal Grandfather    Hypertension Mother    Anxiety disorder Mother    Diabetes Mother    Heart murmur Brother    Colon cancer Neg Hx    Esophageal cancer Neg Hx    Rectal cancer Neg Hx    Stomach cancer Neg Hx    Breast cancer Neg Hx    Colon polyps Neg Hx     Medications- reviewed and updated Current Outpatient Medications  Medication Sig Dispense Refill   COLLAGEN PO Take 2,000 mg by mouth daily.     CRANBERRY PO Take 1 capsule by mouth once a week.     cyclobenzaprine (FLEXERIL) 10 MG tablet Take 1 tablet (10 mg total) by mouth 3 (three) times daily as needed for muscle spasms. 30 tablet 1   mesalamine (LIALDA) 1.2 g EC tablet TAKE 4 TABLETS BY MOUTH DAILY WITH BREAKFAST. 120 tablet 2   MICROGESTIN 1-20 MG-MCG tablet Take 1 tablet by mouth daily. 84 tablet 4   Multiple Vitamins-Minerals (MULTIVITAMIN GUMMIES WOMENS PO) Take 2 tablets by mouth daily.     omeprazole (PRILOSEC) 40 MG capsule TAKE 1 CAPSULE BY MOUTH EVERY DAY 30 capsule 2   valACYclovir (VALTREX) 1000 MG tablet Take 2 tabs po x 1, repeat in 12 hours with symptom onset. 10 tablet 3   No current facility-administered medications for this visit.    Allergies-reviewed and updated No Known Allergies  Social History   Socioeconomic History   Marital status: Married    Spouse name: Not on file   Number of children: 2   Years of education: Not on file    Highest education level: Not on file  Occupational History   Occupation: HUMAN RESOURCES    Employer: [employer]  Tobacco Use   Smoking status: Never   Smokeless tobacco: Never  Vaping Use   Vaping Use: Never used  Substance and Sexual Activity   Alcohol use: Yes    Alcohol/week: 5.0 - 6.0 standard drinks of alcohol    Types: 5 - 6 Standard drinks or equivalent per week   Drug use: No   Sexual activity: Yes    Partners: Male    Birth control/protection: OCP  Other Topics Concern   Not on file  Social History Narrative   Not on file   Social Determinants of Health   Financial Resource Strain: Not on file  Food Insecurity: Not on file  Transportation Needs: Not on file  Physical Activity: Not on file  Stress: Not on file  Social Connections: Not on file        Objective:  Physical Exam: BP 100/80   Pulse 85   Temp 98.1 F (36.7 C)   Ht 5' 6.5" (1.689 m)  Wt 155 lb 12.8 oz (70.7 kg)   LMP 08/02/2021 (Exact Date)   SpO2 97%   BMI 24.77 kg/m   Body mass index is 24.77 kg/m. Wt Readings from Last 3 Encounters:  08/30/21 155 lb 12.8 oz (70.7 kg)  07/25/21 154 lb 12.8 oz (70.2 kg)  05/02/21 159 lb 12.8 oz (72.5 kg)   Gen: NAD, resting comfortably HEENT: TMs normal bilaterally. OP clear. No thyromegaly noted.  CV: RRR with no murmurs appreciated Pulm: NWOB, CTAB with no crackles, wheezes, or rhonchi GI: Normal bowel sounds present. Soft, Nontender, Nondistended. MSK: no edema, cyanosis, or clubbing noted Skin: warm, dry Neuro: CN2-12 grossly intact. Strength 5/5 in upper and lower extremities. Reflexes symmetric and intact bilaterally.  Psych: Normal affect and thought content     Rayden Scheper M. Jerline Pain, MD 08/30/2021 9:17 AM

## 2021-08-30 NOTE — Patient Instructions (Signed)
It was very nice to see you today!  I think you have meralgia paresthetica.  Please try taking pressure off the sides of your hips.  Let me know if not improving in the next 1 to 2 weeks.  We will check blood work today.  Please continue to work on diet and exercise.  I will see back in year for your next physical.  Come back sooner if needed.  Take care, Dr Jerline Pain  PLEASE NOTE:  If you had any lab tests please let us know if you have not heard back within a few days. You may see your results on mychart before we have a chance to review them but we will give you a call once they are reviewed by [place]. If we ordered any referrals today, please let us know if you have not heard from their office within the next week.   Please try these tips to maintain a healthy lifestyle:  Eat at least 3 REAL meals and 1-2 snacks per day.  Aim for no more than 5 hours between eating.  If you eat breakfast, please do so within one hour of getting up.   Each meal should contain half fruits/vegetables, one quarter protein, and one quarter carbs (no bigger than a computer mouse)  Cut down on sweet beverages. This includes juice, soda, and sweet tea.   Drink at least 1 glass of water with each meal and aim for at least 8 glasses per day  Exercise at least 150 minutes every week.    Preventive Care 45-52 Years Old, Female Preventive care refers to lifestyle choices and visits with your health care provider that can promote health and wellness. Preventive care visits are also called wellness exams. What can I expect for my preventive care visit? Counseling Your health care provider may ask you questions about your: Medical history, including: Past medical problems. Family medical history. Pregnancy history. Current health, including: Menstrual cycle. Method of birth control. Emotional well-being. Home life and relationship well-being. Sexual activity and sexual health. Lifestyle, including: Alcohol,  nicotine or tobacco, and drug use. Access to firearms. Diet, exercise, and sleep habits. Work and work Statistician. Sunscreen use. Safety issues such as seatbelt and bike helmet use. Physical exam Your health care provider will check your: Height and weight. These may be used to calculate your BMI (body mass index). BMI is a measurement that tells if you are at a healthy weight. Waist circumference. This measures the distance around your waistline. This measurement also tells if you are at a healthy weight and may help predict your risk of certain diseases, such as type 2 diabetes and high blood pressure. Heart rate and blood pressure. Body temperature. Skin for abnormal spots. What immunizations do I need?  Vaccines are usually given at various ages, according to a schedule. Your health care provider will recommend vaccines for you based on your age, medical history, and lifestyle or other factors, such as travel or where you work. What tests do I need? Screening Your health care provider may recommend screening tests for certain conditions. This may include: Lipid and cholesterol levels. Diabetes screening. This is done by checking your blood sugar (glucose) after you have not eaten for a while (fasting). Pelvic exam and Pap test. Hepatitis B test. Hepatitis C test. HIV (human immunodeficiency virus) test. STI (sexually transmitted infection) testing, if you are at risk. Lung cancer screening. Colorectal cancer screening. Mammogram. Talk with your health care provider about when you should start  having regular mammograms. This may depend on whether you have a family history of breast cancer. BRCA-related cancer screening. This may be done if you have a family history of breast, ovarian, tubal, or peritoneal cancers. Bone density scan. This is done to screen for osteoporosis. Talk with your health care provider about your test results, treatment options, and if necessary, the need for  more tests. Follow these instructions at home: Eating and drinking  Eat a diet that includes fresh fruits and vegetables, whole grains, lean protein, and low-fat dairy products. Take vitamin and mineral supplements as recommended by your health care provider. Do not drink alcohol if: Your health care provider tells you not to drink. You are pregnant, may be pregnant, or are planning to become pregnant. If you drink alcohol: Limit how much you have to 0-1 drink a day. Know how much alcohol is in your drink. In the [place] one drink equals one 12 oz bottle of beer (355 mL), one 5 oz glass of wine (148 mL), or one 1 oz glass of hard liquor (44 mL). Lifestyle Brush your teeth every morning and night with fluoride toothpaste. Floss one time each day. Exercise for at least 30 minutes 5 or more days each week. Do not use any products that contain nicotine or tobacco. These products include cigarettes, chewing tobacco, and vaping devices, such as e-cigarettes. If you need help quitting, ask your health care provider. Do not use drugs. If you are sexually active, practice safe sex. Use a condom or other form of protection to prevent STIs. If you do not wish to become pregnant, use a form of birth control. If you plan to become pregnant, see your health care provider for a prepregnancy visit. Take aspirin only as told by your health care provider. Make sure that you understand how much to take and what form to take. Work with your health care provider to find out whether it is safe and beneficial for you to take aspirin daily. Find healthy ways to manage stress, such as: Meditation, yoga, or listening to music. Journaling. Talking to a trusted person. Spending time with friends and family. Minimize exposure to UV radiation to reduce your risk of skin cancer. Safety Always wear your seat belt while driving or riding in a vehicle. Do not drive: If you have been drinking alcohol. Do not ride with  someone who has been drinking. When you are tired or distracted. While texting. If you have been using any mind-altering substances or drugs. Wear a helmet and other protective equipment during sports activities. If you have firearms in your house, make sure you follow all gun safety procedures. Seek help if you have been physically or sexually abused. What's next? Visit your health care provider once a year for an annual wellness visit. Ask your health care provider how often you should have your eyes and teeth checked. Stay up to date on all vaccines. This information is not intended to replace advice given to you by your health care provider. Make sure you discuss any questions you have with your health care provider. Document Revised: 06/30/2020 Document Reviewed: 06/30/2020 Elsevier Patient Education  [place].

## 2021-08-30 NOTE — Assessment & Plan Note (Signed)
Stable on mesalamine per GI.

## 2021-09-05 ENCOUNTER — Encounter: Payer: Self-pay | Admitting: Family Medicine

## 2021-09-06 ENCOUNTER — Other Ambulatory Visit: Payer: 59

## 2021-09-06 ENCOUNTER — Other Ambulatory Visit: Payer: Self-pay | Admitting: *Deleted

## 2021-09-06 DIAGNOSIS — G44209 Tension-type headache, unspecified, not intractable: Secondary | ICD-10-CM

## 2021-09-06 MED ORDER — CYCLOBENZAPRINE HCL 10 MG PO TABS: 10.0000 mg | ORAL_TABLET | Freq: Three times a day (TID) | ORAL | 1 refills | Status: DC | PRN

## 2021-09-07 ENCOUNTER — Encounter: Payer: Self-pay | Admitting: Family Medicine

## 2021-09-07 ENCOUNTER — Telehealth: Payer: Self-pay | Admitting: Family Medicine

## 2021-09-07 ENCOUNTER — Encounter: Payer: Self-pay | Admitting: Physician Assistant

## 2021-09-07 ENCOUNTER — Emergency Department (HOSPITAL_COMMUNITY)
Admission: EM | Admit: 2021-09-07 | Discharge: 2021-09-07 | Disposition: A | Discharge status: Home / Self Care | Payer: 59 | Attending: Emergency Medicine | Admitting: Emergency Medicine

## 2021-09-07 ENCOUNTER — Encounter (HOSPITAL_COMMUNITY): Payer: Self-pay | Admitting: Emergency Medicine

## 2021-09-07 ENCOUNTER — Emergency Department (HOSPITAL_COMMUNITY): Payer: 59

## 2021-09-07 ENCOUNTER — Other Ambulatory Visit: Payer: Self-pay

## 2021-09-07 ENCOUNTER — Other Ambulatory Visit: Payer: 59

## 2021-09-07 ENCOUNTER — Ambulatory Visit (INDEPENDENT_AMBULATORY_CARE_PROVIDER_SITE_OTHER): Payer: 59 | Admitting: Physician Assistant

## 2021-09-07 VITALS — BP 108/70 | HR 78 | Temp 98.0°F | Ht 66.5 in | Wt 154.2 lb

## 2021-09-07 DIAGNOSIS — R42 Dizziness and giddiness: Secondary | ICD-10-CM

## 2021-09-07 DIAGNOSIS — J0130 Acute sphenoidal sinusitis, unspecified: Secondary | ICD-10-CM | POA: Diagnosis not present

## 2021-09-07 DIAGNOSIS — R112 Nausea with vomiting, unspecified: Secondary | ICD-10-CM | POA: Diagnosis not present

## 2021-09-07 DIAGNOSIS — J322 Chronic ethmoidal sinusitis: Secondary | ICD-10-CM

## 2021-09-07 DIAGNOSIS — J0120 Acute ethmoidal sinusitis, unspecified: Secondary | ICD-10-CM | POA: Diagnosis not present

## 2021-09-07 DIAGNOSIS — R791 Abnormal coagulation profile: Secondary | ICD-10-CM | POA: Diagnosis not present

## 2021-09-07 DIAGNOSIS — J323 Chronic sphenoidal sinusitis: Secondary | ICD-10-CM

## 2021-09-07 LAB — COMPREHENSIVE METABOLIC PANEL
ALT: 20 U/L (ref 0–44)
AST: 20 U/L (ref 15–41)
Albumin: 4.2 g/dL (ref 3.5–5.0)
Alkaline Phosphatase: 46 U/L (ref 38–126)
Anion gap: 7 (ref 5–15)
BUN: 10 mg/dL (ref 6–20)
CO2: 24 mmol/L (ref 22–32)
Calcium: 9.1 mg/dL (ref 8.9–10.3)
Chloride: 106 mmol/L (ref 98–111)
Creatinine, Ser: 0.75 mg/dL (ref 0.44–1.00)
GFR, Estimated: >60 mL/min (ref >60)
Glucose, Bld: 103 mg/dL — ABNORMAL HIGH (ref 70–99)
Potassium: 3.9 mmol/L (ref 3.5–5.1)
Sodium: 137 mmol/L (ref 135–145)
Total Bilirubin: 0.3 mg/dL (ref 0.3–1.2)
Total Protein: 7.5 g/dL (ref 6.5–8.1)

## 2021-09-07 LAB — DIFFERENTIAL
Abs Immature Granulocytes: 0.06 10*3/uL (ref 0.00–0.07)
Basophils Absolute: 0.1 10*3/uL (ref 0.0–0.1)
Basophils Relative: 1 %
Eosinophils Absolute: 0.1 10*3/uL (ref 0.0–0.5)
Eosinophils Relative: 2 %
Immature Granulocytes: 1 %
Lymphocytes Relative: 19 %
Lymphs Abs: 1.6 10*3/uL (ref 0.7–4.0)
Monocytes Absolute: 0.6 10*3/uL (ref 0.1–1.0)
Monocytes Relative: 7 %
Neutro Abs: 6 10*3/uL (ref 1.7–7.7)
Neutrophils Relative %: 70 %

## 2021-09-07 LAB — I-STAT BETA HCG BLOOD, ED (MC, WL, AP ONLY): I-stat hCG, quantitative: <5 m[IU]/mL (ref <5)

## 2021-09-07 LAB — CBC
HCT: 38.6 % (ref 36.0–46.0)
Hemoglobin: 13.4 g/dL (ref 12.0–15.0)
MCH: 31.1 pg (ref 26.0–34.0)
MCHC: 34.7 g/dL (ref 30.0–36.0)
MCV: 89.6 fL (ref 80.0–100.0)
Platelets: 282 10*3/uL (ref 150–400)
RBC: 4.31 MIL/uL (ref 3.87–5.11)
RDW: 12.3 % (ref 11.5–15.5)
WBC: 8.5 10*3/uL (ref 4.0–10.5)
nRBC: 0 % (ref 0.0–0.2)

## 2021-09-07 LAB — CBG MONITORING, ED: Glucose-Capillary: 85 mg/dL (ref 70–99)

## 2021-09-07 LAB — APTT: aPTT: 29 seconds (ref 24–36)

## 2021-09-07 LAB — PROTIME-INR
INR: 1 (ref 0.8–1.2)
Prothrombin Time: 13.3 seconds (ref 11.4–15.2)

## 2021-09-07 LAB — ETHANOL: Alcohol, Ethyl (B): <10 mg/dL (ref <10)

## 2021-09-07 MED ORDER — ONDANSETRON 4 MG PO TBDP: 4.0000 mg | ORAL_TABLET | Freq: Three times a day (TID) | ORAL | 0 refills | Status: DC | PRN

## 2021-09-07 MED ORDER — ONDANSETRON 4 MG PO TBDP
4.0000 mg | ORAL_TABLET | Freq: Once | ORAL | Status: AC
  Administered 2021-09-07: 4 mg via ORAL
  Filled 2021-09-07: qty 1

## 2021-09-07 MED ORDER — SODIUM CHLORIDE 0.9% FLUSH: 3.0000 mL | Freq: Once | INTRAVENOUS | Status: DC

## 2021-09-07 MED ORDER — MECLIZINE HCL 25 MG PO TABS
25.0000 mg | ORAL_TABLET | Freq: Once | ORAL | Status: AC
  Administered 2021-09-07: 25 mg via ORAL
  Filled 2021-09-07: qty 1

## 2021-09-07 MED ORDER — MECLIZINE HCL 25 MG PO TABS: 25.0000 mg | ORAL_TABLET | Freq: Three times a day (TID) | ORAL | 0 refills | Status: DC | PRN

## 2021-09-07 MED ORDER — AMOXICILLIN-POT CLAVULANATE 875-125 MG PO TABS: 1.0000 | ORAL_TABLET | Freq: Two times a day (BID) | ORAL | 0 refills | Status: DC

## 2021-09-07 NOTE — Progress Notes (Signed)
Alison Fleming is a 46 y.o. female here for a new problem.  History of Present Illness:   Chief Complaint  Patient presents with   dizziness    Pt had an episode of severe dizziness with nausea and not sure if right side of face felt different. Episode lasted for about 1.5 hours, subsided.    HPI  Dizziness Patient woke up this morning feeling dizzy.  She has had dizziness episodes since waking up.  She is having nausea.  She started googling her symptoms and got very anxious about possibly having a stroke.  She states that it lasted for 1-1/2 hours.  Now she feels as though part of her face feels differently than the other side of her face.  She has never experienced this before.   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   Anxiety    Atypical chest pain 04/24/2017   Dyspepsia, s/p normal EGD 03/08/17, with recommendation for lifetime anti-reflux regimen 03/26/2017   GAD (generalized anxiety disorder) 05/10/2016   GERD (gastroesophageal reflux disease)    History of spinal fusion for scoliosis 03/26/2017   HSV infection    Hyperlipidemia    Left upper quadrant pain 03/26/2017   Malaise and fatigue 03/26/2017   Paresthesias/numbness 04/24/2017   Polyarthralgia 03/28/2017   Positive Lyme disease serology    Seasonal allergies    Tension headache 03/26/2017     Social History   Tobacco Use   Smoking status: Never   Smokeless tobacco: Never  Vaping Use   Vaping Use: Never used  Substance Use Topics   Alcohol use: Yes    Alcohol/week: 5.0 - 6.0 standard drinks of alcohol    Types: 5 - 6 Standard drinks or equivalent per week   Drug use: No    Past Surgical History:  Procedure Laterality Date   COLONOSCOPY  2019   DILATATION & CURETTAGE/HYSTEROSCOPY WITH MYOSURE N/A 07/13/2020   Procedure: DILATATION & CURETTAGE/HYSTEROSCOPY WITH MYOSURE;  Surgeon: Megan Salon, MD;  Location: [facility];  Service: Gynecology;  Laterality: N/A;   DILATATION  & CURRETTAGE/HYSTEROSCOPY WITH RESECTOCOPE  02/12/2012   Procedure: [place];  Surgeon: Azalia Bilis, MD;  Location: [facility];  Service: Gynecology;  Laterality: N/A;   DILATION AND CURETTAGE OF UTERUS  2010   DILATION AND CURETTAGE OF UTERUS  2012   SPINAL FUSION  2008    Family History  Problem Relation Age of Onset   Diabetes Maternal Grandmother    Hypertension Maternal Grandmother    Heart disease Maternal Grandmother    Heart attack Maternal Grandmother    Osteoporosis Maternal Grandmother    Diabetes Maternal Grandfather    Hypertension Maternal Grandfather    Heart disease Maternal Grandfather    Heart attack Maternal Grandfather    Hypertension Mother    Anxiety disorder Mother    Diabetes Mother    Heart murmur Brother    Colon cancer Neg Hx    Esophageal cancer Neg Hx    Rectal cancer Neg Hx    Stomach cancer Neg Hx    Breast cancer Neg Hx    Colon polyps Neg Hx     No Known Allergies  Current Medications:   Current Outpatient Medications:    COLLAGEN PO, Take 2,000 mg by mouth daily., Disp: , Rfl:    CRANBERRY PO, Take 1 capsule by mouth once a week., Disp: , Rfl:    cyclobenzaprine (FLEXERIL) 10 MG tablet,  Take 1 tablet (10 mg total) by mouth 3 (three) times daily as needed for muscle spasms., Disp: 30 tablet, Rfl: 1   mesalamine (LIALDA) 1.2 g EC tablet, TAKE 4 TABLETS BY MOUTH DAILY WITH BREAKFAST., Disp: 120 tablet, Rfl: 2   MICROGESTIN 1-20 MG-MCG tablet, Take 1 tablet by mouth daily., Disp: 84 tablet, Rfl: 4   Multiple Vitamins-Minerals (MULTIVITAMIN GUMMIES WOMENS PO), Take 2 tablets by mouth daily., Disp: , Rfl:    omeprazole (PRILOSEC) 40 MG capsule, TAKE 1 CAPSULE BY MOUTH EVERY DAY, Disp: 30 capsule, Rfl: 2   valACYclovir (VALTREX) 1000 MG tablet, Take 2 tabs po x 1, repeat in 12 hours with symptom onset., Disp: 10 tablet, Rfl: 3   Review of Systems:   ROS Negative unless otherwise specified per  HPI.  Vitals:   Vitals:   09/07/21 1128  BP: 108/70  Pulse: 78  Temp: 98 F (36.7 C)  TempSrc: Temporal  SpO2: 96%  Weight: 154 lb 4 oz (70 kg)  Height: 5' 6.5" (1.689 m)     Body mass index is 24.52 kg/m.  Physical Exam:   Physical Exam Constitutional:      Appearance: Normal appearance. She is well-developed.  HENT:     Head: Normocephalic and atraumatic.  Eyes:     General: Lids are normal.     Extraocular Movements: Extraocular movements intact.     Conjunctiva/sclera: Conjunctivae normal.  Pulmonary:     Effort: Pulmonary effort is normal.  Musculoskeletal:        General: Normal range of motion.     Cervical back: Normal range of motion and neck supple.  Skin:    General: Skin is warm and dry.  Neurological:     Mental Status: She is alert and oriented to person, place, and time.  Psychiatric:        Attention and Perception: Attention and perception normal.        Mood and Affect: Mood is anxious.        Behavior: Behavior normal.        Thought Content: Thought content normal.        Judgment: Judgment normal.     Assessment and Plan:   Dizziness EKG was attempted but when she tried to lay flat she started having severe nausea I reviewed patient's symptoms with PCP, Dr. Jerline Pain He was in agreement for patient to go to the emergency room for further evaluation Patient verbalized understanding to plan She refused EMS but her husband came and took her by his personal vehicle to [facility] for further evaluation  Time spent with patient today was 26 minutes which consisted of chart review, discussing diagnosis, work up, treatment answering questions and documentation.   [name], PA-C

## 2021-09-07 NOTE — Telephone Encounter (Signed)
Routing to PCP and SW, PA-C  Patient Name: Alison Fleming Gender: Female DOB: 09/29/1975 Age: 46 Y 30 M 20 D Return Phone Number: [phone] (Primary) Address: City/ State/ Zip: [place]   [address] Client [place] [facility] [place] Client Site [place] [facility] [place] [facility] Provider Dimas Chyle- MD Contact Type Call Who Is Calling Patient / Member / Family / Caregiver Call Type Triage / Clinical Relationship To Patient Self Return Phone Number [phone] (Primary) Chief Complaint WEAKNESS - sudden on one side of face or body Reason for Call Symptomatic / Request for Health Information Initial Comment Call sent from office. Caller c/o dizziness, having to sit down, face felt droopy but husband is unable to see it. She woke up dizzy.States face feels fine now. Translation No Nurse Assessment Nurse: Ronnald Ramp, RN, Miranda Date/Time (Eastern Time): 09/07/2021 8:01:26 AM Confirm and document reason for call. If symptomatic, describe symptoms. ---Caller states she woke up feeling dizzy. She has had dizzy spells off and on since she got up. She has to keep sitting down. She is also having nausea. Total it lasted for 45 min and she is feeling better now. The right side of her face felt different than the left. There was nothing visible in the mirror. Does the patient have any new or worsening symptoms? ---Yes Will a triage be completed? ---Yes Related visit to physician within the last 2 weeks? ---Yes Does the PT have any chronic conditions? (i.e. diabetes, asthma, this includes High risk factors for pregnancy, etc.) ---Yes List chronic conditions. ---GERD, Ulcerative Colitis Is the patient pregnant or possibly pregnant? (Ask all females between the ages of 43-55) ---No Is this a behavioral health or substance abuse call? ---No  Guidelines Guideline Title Affirmed Question Affirmed Notes Nurse Date/Time  (Eastern Time) Dizziness - Vertigo [1] MODERATE dizziness (e.g., vertigo; feels very unsteady, interferes with normal activities) AND [2] has NOT been evaluated by doctor (or NP/PA) for this Ronnald Ramp, RN, Miranda 09/07/2021 8:06:57 AM Disp. Time Eilene Ghazi Time) Disposition Final User 09/07/2021 8:00:28 AM Send to Urgent Queue Kathlynn Grate 09/07/2021 8:10:23 AM See PCP within 24 Hours Yes Ronnald Ramp, RN, Miranda Final Disposition 09/07/2021 8:10:23 AM See PCP within 24 Hours Yes Ronnald Ramp, RN, Judge Stall Disagree/Comply Comply Caller Understands Yes PreDisposition Did not know what to do Care Advice Given Per Guideline SEE PCP WITHIN 24 HOURS: * IF OFFICE WILL BE OPEN: You need to be examined within the next 24 hours. Call your doctor (or NP/PA) when the office opens and make an appointment. CARE ADVICE given per Dizziness - Vertigo (Adult) guideline. CALL BACK IF: * Severe headache occurs * Weakness develops in an arm or leg * Unable to walk without falling * You become worse Referrals REFERRED TO PCP OFFICE

## 2021-09-07 NOTE — Patient Instructions (Signed)
It was great to see you!  Please go to the emergency room for evaluation.  Dr. Jerline Pain will be informed of your ER course and progression.  Take care,  [name] PA-C

## 2021-09-07 NOTE — ED Provider Notes (Signed)
[facility] EMERGENCY DEPARTMENT Provider Note   CSN: [id number] Arrival date & time: 09/07/21  1222     History  Chief Complaint  Patient presents with   Dizziness    Alison Fleming is a 46 y.o. female.  46 year old female with past medical history of tension headaches, polyarthralgia, generalized anxiety disorder, GERD, hyperlipidemia presents with concern for dizziness with nausea and vomiting.  Patient states that she woke up this morning around 6:30 AM, felt fine when she is laying in bed however when she stood up to get out of bed the room began to spin.  This was brief, she was unable to take a shower and finished getting ready.  Notes that throughout the morning she would have return of room spinning sensation with sudden changes in position and feeling nauseous.  Patient called her PCP office who recommended she go to the ER however agreed to see patient in clinic.  Patient came to the office and when they were trying to obtain an EKG, laid her down and she became severely nauseous and dizzy and vomited.  Patient spouse was called to transport her to the ER where she was evaluated in triage and work-up was obtained.  Patient states that she was able to get the CT of her head completed by laying down slowly however when she stood back up she had return of room spinning sensation and nausea.  Patient has not been given anything for her symptoms, has not taken anything for her symptoms.  States that she did recently travel to [place] a few weeks ago however denies any recent illness or sick contacts.  States this morning when she was analyzing her symptoms she felt like she may have had some diminished sensation to the right side of her face however this has resolved and she questions if she was just over analyzing her symptoms in that moment.  She denies any visual disturbance otherwise, difficulty with word finding or changes in speech, unilateral arm weakness or  numbness.  No history of similar symptoms previously.       Home Medications Prior to Admission medications   Medication Sig Start Date End Date Taking? Authorizing Provider  amoxicillin-clavulanate (AUGMENTIN) 875-125 MG tablet Take 1 tablet by mouth every 12 (twelve) hours. 09/07/21  Yes Tacy Learn, PA-C  meclizine (ANTIVERT) 25 MG tablet Take 1 tablet (25 mg total) by mouth 3 (three) times daily as needed for dizziness. 09/07/21  Yes Tacy Learn, PA-C  ondansetron (ZOFRAN-ODT) 4 MG disintegrating tablet Take 1 tablet (4 mg total) by mouth every 8 (eight) hours as needed for nausea or vomiting. 09/07/21  Yes Tacy Learn, PA-C  COLLAGEN PO Take 2,000 mg by mouth daily.    [provider]  CRANBERRY PO Take 1 capsule by mouth once a week.    [provider]  cyclobenzaprine (FLEXERIL) 10 MG tablet Take 1 tablet (10 mg total) by mouth 3 (three) times daily as needed for muscle spasms. 09/06/21   Vivi Barrack, MD  mesalamine (LIALDA) 1.2 g EC tablet TAKE 4 TABLETS BY MOUTH DAILY WITH BREAKFAST. 07/27/21   Mauri Pole, MD  MICROGESTIN 1-20 MG-MCG tablet Take 1 tablet by mouth daily. 05/02/21   Megan Salon, MD  Multiple Vitamins-Minerals (MULTIVITAMIN GUMMIES WOMENS PO) Take 2 tablets by mouth daily.    [provider]  omeprazole (PRILOSEC) 40 MG capsule TAKE 1 CAPSULE BY MOUTH EVERY DAY 07/27/21   Jerline Pain,  Algis Greenhouse, MD  valACYclovir (VALTREX) 1000 MG tablet Take 2 tabs po x 1, repeat in 12 hours with symptom onset. 05/02/21   Megan Salon, MD      Allergies    Patient has no known allergies.    Review of Systems   Review of Systems Negative except as per HPI Physical Exam Updated Vital Signs BP 117/86 (BP Location: Right Arm)   Pulse 83   Temp 98 F (36.7 C) (Oral)   Resp 14   LMP 08/02/2021 (Exact Date)   SpO2 100%  Physical Exam Vitals and nursing note reviewed.  Constitutional:      General: She is not in acute distress.     Appearance: She is well-developed. She is not diaphoretic.  HENT:     Head: Normocephalic and atraumatic.     Right Ear: Tympanic membrane and ear canal normal.     Left Ear: Tympanic membrane and ear canal normal.     Mouth/Throat:     Mouth: Mucous membranes are moist.  Eyes:     Extraocular Movements: Extraocular movements intact.     Conjunctiva/sclera: Conjunctivae normal.     Pupils: Pupils are equal, round, and reactive to light.  Pulmonary:     Effort: Pulmonary effort is normal.  Musculoskeletal:     Cervical back: Neck supple.     Right lower leg: No edema.     Left lower leg: No edema.  Lymphadenopathy:     Cervical: No cervical adenopathy.  Skin:    General: Skin is warm and dry.     Findings: No erythema or rash.  Neurological:     Mental Status: She is alert and oriented to person, place, and time.     GCS: GCS eye subscore is 4. GCS verbal subscore is 5. GCS motor subscore is 6.     Cranial Nerves: No cranial nerve deficit.     Sensory: No sensory deficit.     Motor: No weakness.     Gait: Gait normal.     Deep Tendon Reflexes: Reflexes normal.  Psychiatric:        Behavior: Behavior normal.     ED Results / Procedures / Treatments   Labs (all labs ordered are listed, but only abnormal results are displayed) Labs Reviewed  COMPREHENSIVE METABOLIC PANEL - Abnormal; Notable for the following components:      Result Value   Glucose, Bld 103 (*)    All other components within normal limits  PROTIME-INR  APTT  CBC  DIFFERENTIAL  ETHANOL  I-STAT CHEM 8, ED  CBG MONITORING, ED  I-STAT BETA HCG BLOOD, ED ([facility], [facility], [facility] ONLY)    EKG EKG Interpretation  Date/Time:  Wednesday September 07 2021 13:01:23 EDT Ventricular Rate:  90 PR Interval:  138 QRS Duration: 78 QT Interval:  356 QTC Calculation: 435 R Axis:   66 Text Interpretation: Sinus rhythm with marked sinus arrhythmia T wave abnormality, consider inferior ischemia Abnormal ECG When compared with ECG  of 03-May-2016 09:25, PREVIOUS ECG IS PRESENT No significant change since last tracing Confirmed by Lacretia Leigh ([id number]) on 09/07/2021 4:12:26 PM  Radiology CT HEAD WO CONTRAST  Result Date: 09/07/2021 CLINICAL DATA:  Dizziness, persistent/recurrent, cardiac or vascular cause suspected EXAM: CT HEAD WITHOUT CONTRAST TECHNIQUE: Contiguous axial images were obtained from the base of the skull through the vertex without intravenous contrast. RADIATION DOSE REDUCTION: This exam was performed according to the departmental dose-optimization program which includes automated exposure control, adjustment  of the mA and/or kV according to patient size and/or use of iterative reconstruction technique. COMPARISON:  03/21/2019 FINDINGS: Brain: No evidence of acute infarction, hemorrhage, hydrocephalus, extra-axial collection or mass lesion/mass effect. Vascular: No hyperdense vessel or unexpected calcification. Skull: Normal. Negative for fracture or focal lesion. Sinuses/Orbits: Mucosal thickening of the bilateral ethmoid and sphenoid sinuses. Mastoid air cells are clear. Other: None. IMPRESSION: 1. No acute intracranial abnormality. 2. Mucosal thickening of the bilateral ethmoid and sphenoid sinuses. Electronically Signed   By: Davina Poke D.O.   On: 09/07/2021 14:22    Procedures Procedures    Medications Ordered in ED Medications  meclizine (ANTIVERT) tablet 25 mg (25 mg Oral Given 09/07/21 1636)  ondansetron (ZOFRAN-ODT) disintegrating tablet 4 mg (4 mg Oral Given 09/07/21 1636)    ED Course/ Medical Decision Making/ A&P                           Medical Decision Making Amount and/or Complexity of Data Reviewed Labs: ordered. Radiology: ordered.  Risk Prescription drug management.   This patient presents to the ED for concern of dizziness and nausea/vomiting, this involves an extensive number of treatment options, and is a complaint that carries with it a high risk of complications and  morbidity.  The differential diagnosis includes but not limited to vertigo, CVA, metabolic derangement, electrolyte disturbance   Co morbidities that complicate the patient evaluation  GERD, generalized anxiety disorder, tension headache, spinal fusion for scoliosis, hyperlipidemia   Additional history obtained:  External records from outside source obtained and reviewed including visit to PCP today for dizziness and nausea, deferred to ER for further evaluation.   Lab Tests:  I Ordered, and personally interpreted labs.  The pertinent results include: CBC, CMP, PT/INR, hCG and alcohol without significant findings.   Imaging Studies ordered:  I ordered imaging studies including CT head I independently visualized and interpreted imaging which showed more than sphenoid sinus disease I agree with the radiologist interpretation   Cardiac Monitoring: / EKG:  The patient was maintained on a cardiac monitor.  I personally viewed and interpreted the cardiac monitored which showed an underlying rhythm of: Sinus rhythm, rate 90, unchanged from prior   Problem List / ED Course / Critical interventions / Medication management  46 year old female with complaint of dizziness with nausea and vomiting as above.  Dizziness described as room spinning, occurs with changes in position, resolves with holding still.  Her neurological exam is unremarkable.  Her work-up is reassuring.  Patient was given Antivert and Zofran with reported improvement in symptoms and requesting discharge.  Advised to recheck with primary care provider.  Her CT does show possible ethmoid and sphenoid sinusitis, will discharge with course of Augmentin.  Also given meclizine and Zofran. I ordered medication including Antivert, Zofran for dizziness, nausea Reevaluation of the patient after these medicines showed that the patient improved I have reviewed the patients home medicines and have made adjustments as needed   Social  Determinants of Health:  With family, has PCP   Test / Admission - Considered:  MRI, dizziness seems consistent with vertigo/positional, improved with antivert/zofran, ready for dc with return to ER precautions          Final Clinical Impression(s) / ED Diagnoses Final diagnoses:  Vertigo  Ethmoid sinusitis, unspecified chronicity  Sphenoid sinusitis, unspecified chronicity    Rx / DC Orders ED Discharge Orders          Ordered  amoxicillin-clavulanate (AUGMENTIN) 875-125 MG tablet  Every 12 hours        09/07/21 1656    meclizine (ANTIVERT) 25 MG tablet  3 times daily PRN        09/07/21 1656    ondansetron (ZOFRAN-ODT) 4 MG disintegrating tablet  Every 8 hours PRN        09/07/21 1656              Tacy Learn, PA-C 09/07/21 2249    Lacretia Leigh, MD 09/09/21 2113

## 2021-09-07 NOTE — ED Provider Triage Note (Signed)
Emergency Medicine Provider Triage Evaluation Note  BRAYLYNN LEWING , a 46 y.o. female  was evaluated in triage.  Pt complains of dizziness which she describes as both room spinning and feeling off balance.  Dizziness started upon waking this morning. Dizziness associated with nausea and vomiting. Dizziness worse with positional changes.  Denies visual changes, speech changes, and unilateral weakness.  No previous CVA.  Denies chest pain or shortness of breath.  No recent illness.  Review of Systems  Positive: dizziness Negative: fever  Physical Exam  BP 103/71 (BP Location: Right Arm)   Pulse 77   Temp 98.2 F (36.8 C) (Oral)   Resp 16   LMP 08/02/2021 (Exact Date)   SpO2 100%  Gen:   Awake, no distress   Resp:  Normal effort  MSK:   Moves extremities without difficulty  Other:  Normal speech.  No facial droop.  No pronator drift.  Equal grip strength.  Medical Decision Making  Medically screening exam initiated at 1:00 PM.  Appropriate orders placed.  BAYLIN CABAL was informed that the remainder of the evaluation will be completed by another provider, this initial triage assessment does not replace that evaluation, and the importance of remaining in the ED until their evaluation is complete.  Dizziness, labs/CT head ordered   Suzy Bouchard, PA-C 09/07/21 1301

## 2021-09-07 NOTE — ED Triage Notes (Addendum)
Patient here with complaint of dizziness that was present on waking at 0625 this morning and lasted approximately an hour and 45 minutes, then resolved spontaneously, LKW midnight last night. Patient went to PCP and dizziness started again suddenly upon being positioned supine at 1130. No vision changes, alert and oriented x4, no arm drift, no facial droop, no ataxia, no leg drift, sensation equal bilaterally in face, arms, and legs.

## 2021-09-07 NOTE — Telephone Encounter (Signed)
Spoke to pt told her Dr. Jerline Pain reviewed the Triage note and recommends you to go the ED to be evaluated due to stroke like symptoms. Pt says she is feeling fine now and that there was nothing wrong with her face she thinks the triage nurse was focusing on that and she said her dizziness is gone now. Pt said she does not want to go to the ED and wants to be seen in the office. Told her okay see you at 11:20. Pt verbalized understanding.

## 2021-09-07 NOTE — Discharge Instructions (Addendum)
Follow up with your primary care provider. Return to ER for new or worsening symptoms. Take Augmentin as prescribed and complete the full course. Take Antivert as needed as prescribed for dizziness. Take Zofran as needed as prescribed for nausea.

## 2021-09-07 NOTE — Telephone Encounter (Signed)
Patient states she has been feeling dizzy to the point she needs to sit and breath. She feels her face "funny" "droopy". Patient triaged.

## 2021-09-09 NOTE — Telephone Encounter (Signed)
Its a very common incidental finding and is not a sign of anything serious. IT is usually from allergies though if she is having symptoms of a sinus infection when we can treat that. Would not recommend any antibiotics unless she is having a lot of facial pain or pressure.  Algis Greenhouse. Jerline Pain, MD 09/09/2021 10:30 AM

## 2021-09-13 ENCOUNTER — Encounter: Payer: Self-pay | Admitting: Family Medicine

## 2021-09-13 ENCOUNTER — Other Ambulatory Visit: Payer: 59

## 2021-09-13 ENCOUNTER — Ambulatory Visit (INDEPENDENT_AMBULATORY_CARE_PROVIDER_SITE_OTHER): Payer: 59 | Admitting: Family Medicine

## 2021-09-13 DIAGNOSIS — R42 Dizziness and giddiness: Secondary | ICD-10-CM | POA: Insufficient documentation

## 2021-09-13 LAB — LIPID PANEL
Cholesterol: 188 mg/dL (ref 0–200)
HDL: 61.7 mg/dL (ref >39.00)
LDL Cholesterol: 100 mg/dL — ABNORMAL HIGH (ref 0–99)
NonHDL: 126.58
Total CHOL/HDL Ratio: 3
Triglycerides: 132 mg/dL (ref 0.0–149.0)
VLDL: 26.4 mg/dL (ref 0.0–40.0)

## 2021-09-13 LAB — HEMOGLOBIN A1C: Hgb A1c MFr Bld: 5.4 % (ref 4.6–6.5)

## 2021-09-13 LAB — TSH: TSH: 1.16 u[IU]/mL (ref 0.35–5.50)

## 2021-09-13 NOTE — Assessment & Plan Note (Signed)
No red flags.  Had work-up in ED which is reassuring.  History and symptoms are consistent with BPPV.  She had a reassuring exam today-do not think we need to check any further imaging at this point.  We will place referral to vestibular rehab.  We will continue taking meclizine and Zofran as needed.

## 2021-09-13 NOTE — Progress Notes (Signed)
   Alison Fleming is a 46 y.o. female who presents today for an office visit.  Assessment/Plan:  New/Acute Problems: Sinusitis  Incidentally found on CT scan.  Not sure if this is contributing to her symptoms however she will finish her course of Augmentin  Chronic Problems Addressed Today: Vertigo No red flags.  Had work-up in ED which is reassuring.  History and symptoms are consistent with BPPV.  She had a reassuring exam today-do not think we need to check any further imaging at this point.  We will place referral to vestibular rehab.  We will continue taking meclizine and Zofran as needed.     Subjective:  HPI:  Patient here for ED follow-up.  She went to the ED on 09/07/2021 with sudden onset dizziness and nausea.  She initially presented to this office to be seen by different provider.  She had significant episode of nausea with intractable vomiting and EMS was called.  In the ED had work-up including CT scan and labs.  She was instantly found to have sinusitis which was treated with a course of Augmentin.  She was given a dose of meclizine and Zofran for presumed BPPV.  Symptoms improved and she was discharged home.  Since being home symptoms have been better but she is still having some occasional episodes. Sometimes happens randomly but a lot of times will be due to change in position. She has been taking meclizine which has helps.  She has not had to take any Zofran.  No weakness or numbness.  No vision changes.       Objective:  Physical Exam: BP 114/71   Pulse 76   Temp 98 F (36.7 C) (Temporal)   Ht 5' 6.5" (1.689 m)   Wt 153 lb 12.8 oz (69.8 kg)   LMP 08/02/2021 (Exact Date)   SpO2 99%   BMI 24.45 kg/m   Gen: No acute distress, resting comfortably HEENT: TMs clear. CV: Regular rate and rhythm with no murmurs appreciated Pulm: Normal work of breathing, clear to auscultation bilaterally with no crackles, wheezes, or rhonchi Neuro: Grossly normal, moves all  extremities. CN 2-12 intact.  No dysmetria.  No nystagmus. Psych: Normal affect and thought content  Time Spent: 40 minutes of total time was spent on the date of the encounter performing the following actions: chart review prior to seeing the patient including recent ED visit and workup, obtaining history, performing a medically necessary exam, counseling on the treatment plan, placing orders, and documenting in our EHR.        Algis Greenhouse. Jerline Pain, MD 09/13/2021 11:05 AM

## 2021-09-13 NOTE — Patient Instructions (Signed)
It was very nice to see you today!  We will refer you for vestibular rehab.  Please finish your antibiotics.  We will get your blood work today.  Please let us know if you have any worsening of symptoms.  Take care, Dr Jerline Pain  PLEASE NOTE:  If you had any lab tests please let us know if you have not heard back within a few days. You may see your results on mychart before we have a chance to review them but we will give you a call once they are reviewed by [place]. If we ordered any referrals today, please let us know if you have not heard from their office within the next week.   Please try these tips to maintain a healthy lifestyle:  Eat at least 3 REAL meals and 1-2 snacks per day.  Aim for no more than 5 hours between eating.  If you eat breakfast, please do so within one hour of getting up.   Each meal should contain half fruits/vegetables, one quarter protein, and one quarter carbs (no bigger than a computer mouse)  Cut down on sweet beverages. This includes juice, soda, and sweet tea.   Drink at least 1 glass of water with each meal and aim for at least 8 glasses per day  Exercise at least 150 minutes every week.

## 2021-09-14 ENCOUNTER — Other Ambulatory Visit: Payer: 59

## 2021-09-14 NOTE — Therapy (Signed)
OUTPATIENT PHYSICAL THERAPY VESTIBULAR EVALUATION     Patient Name: Alison Fleming MRN: [id number] DOB:10-21-1975, 46 y.o., female Today's Date: 09/16/2021  PCP: Vivi Barrack, MD REFERRING PROVIDER: Vivi Barrack, MD   PT End of Session - 09/16/21 1141     Visit Number 1    Number of Visits 9    Date for PT Re-Evaluation 10/14/21    Authorization Type [employer]    PT Start Time 1057    PT Stop Time 1138    PT Time Calculation (min) 41 min    Activity Tolerance Patient tolerated treatment well    Behavior During Therapy [facility] for tasks assessed/performed             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   Anxiety    Atypical chest pain 04/24/2017   Dyspepsia, s/p normal EGD 03/08/17, with recommendation for lifetime anti-reflux regimen 03/26/2017   GAD (generalized anxiety disorder) 05/10/2016   GERD (gastroesophageal reflux disease)    History of spinal fusion for scoliosis 03/26/2017   HSV infection    Hyperlipidemia    Left upper quadrant pain 03/26/2017   Malaise and fatigue 03/26/2017   Paresthesias/numbness 04/24/2017   Polyarthralgia 03/28/2017   Positive Lyme disease serology    Seasonal allergies    Tension headache 03/26/2017   Past Surgical History:  Procedure Laterality Date   COLONOSCOPY  2019   DILATATION & CURETTAGE/HYSTEROSCOPY WITH MYOSURE N/A 07/13/2020   Procedure: [place];  Surgeon: Megan Salon, MD;  Location: [place];  Service: Gynecology;  Laterality: N/A;   DILATATION & CURRETTAGE/HYSTEROSCOPY WITH RESECTOCOPE  02/12/2012   Procedure: [place];  Surgeon: Azalia Bilis, MD;  Location: [place] [facility];  Service: Gynecology;  Laterality: N/A;   DILATION AND CURETTAGE OF UTERUS  2010   DILATION AND CURETTAGE OF UTERUS  2012   SPINAL FUSION  2008   Patient Active Problem List   Diagnosis Date Noted   Vertigo 09/13/2021   Right  sided ulcerative colitis ([place]) 07/22/2020   Irregular bleeding    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   Encounter for surveillance of contraceptive pills 04/26/2020   Inclusion cyst of vulva 04/26/2020   Paresthesias/numbness 04/24/2017   Polyarthralgia 03/28/2017   Dyspepsia, s/p normal EGD 03/08/17, with recommendation for lifetime anti-reflux regimen 03/26/2017   Cervical pain with history of scoliosis 03/26/2017   Malaise and fatigue 03/26/2017   Tension headache 03/26/2017    ONSET DATE: 09/07/21  REFERRING DIAG: R42 (ICD-10-CM) - Vertigo  THERAPY DIAG:  Dizziness and giddiness  Rationale for Evaluation and Treatment Rehabilitation  SUBJECTIVE:   SUBJECTIVE STATEMENT: First noticed dizziness on 09/07/21. Became worse when she went to the MD and was laid down for an EKG. Then was sent to the ED to clear of red flags. Had some N/V in the ED. Episodes are described as spinning, woozy and last seconds. Worse with quick movements, laying down, sitting up. Since initial onset it has gotten a little better. Denies head trauma, vision changes/double vision, hearing loss, tinnitus, otalgia, migraines. Denies allergies but CT showed sinusitis which was treated.   Pt accompanied by: self  PERTINENT HISTORY: anxiety, GERD, HLD, polyarthralgia, Lyme disease, spinal fusion 2008   PAIN:  Are you having pain? No  PRECAUTIONS: None  WEIGHT BEARING RESTRICTIONS No  FALLS: Has patient fallen in last 6 months? No  LIVING ENVIRONMENT: Lives with: lives with their family Lives in:  House/apartment Has following equipment at home: None  PLOF: Independent; works from home  PATIENT GOALS improve dizziness  OBJECTIVE:   DIAGNOSTIC FINDINGS:  09/07/21 head CT: No acute intracranial abnormality. Mucosal thickening of the bilateral ethmoid and sphenoid sinuses.   COGNITION: Overall cognitive status: Within functional limits for tasks assessed   SENSATION: WFL  POSTURE: No Significant  postural limitations   GAIT: Gait pattern: WFL   PATIENT SURVEYS:  FOTO [phone]   VESTIBULAR ASSESSMENT   GENERAL OBSERVATION: patient wears contacts for far vision     OCULOMOTOR EXAM:   Ocular Alignment: normal   Ocular ROM: No Limitations   Spontaneous Nystagmus: absent   Gaze-Induced Nystagmus: absent   Smooth Pursuits: intact   Saccades: intact     VESTIBULAR - OCULAR REFLEX:    Slow VOR: Comment: c/o mild dizziness horizontal and vertical    VOR Cancellation: Normal   Head-Impulse Test: HIT Right: possible positive; limited by guarding HIT Left: negative     POSITIONAL TESTING:  Right Roll Test: negative Left Roll Test: negative  Right Dix-Hallpike: negative Left Dix-Hallpike: negative Right Sidelying: negative Left Sidelying: negative      VESTIBULAR TREATMENT:  HEP:   Access Code: [email or web address] URL: [email or web address] Date: 09/16/2021 Prepared by: [facility]  Exercises - Standing Gaze Stabilization with Head Rotation  - 1 x daily - 5 x weekly - 2-3 sets - 30 sec hold - Standing Gaze Stabilization with Head Nod  - 1 x daily - 5 x weekly - 2-3 sets - 30 sec hold - Brandt-Daroff Vestibular Exercise  - 1 x daily - 5 x weekly - 2 sets - 3-5 reps  PATIENT EDUCATION: Education details: prognosis, POC, HEP; edu on BPPV and possible vestibular neuritis related to sinusitis  Person educated: Patient Education method: Explanation, Demonstration, Tactile cues, Verbal cues, and Handouts Education comprehension: verbalized understanding and returned demonstration   GOALS: Goals reviewed with patient? Yes  SHORT TERM GOALS: Target date: 09/30/2021  Patient to be independent with initial HEP. Baseline: HEP initiated Goal status: INITIAL    LONG TERM GOALS: Target date: 10/14/2021  Patient to be independent with advanced HEP. Baseline: Not yet initiated  Goal status: INITIAL      ASSESSMENT:  CLINICAL IMPRESSION:   Patient is a 46 y/o F presenting to OPPT with c/o dizziness for the past week.Episodes are described as spinning, woozy and last seconds. Worse with quick movements, laying down, sitting up. Notes improvement from initial onset. Denies head trauma, vision changes/double vision, hearing loss, tinnitus, otalgia, migraines. Denies allergies but CT showed sinusitis which was treated. Oculomotor exam revealed mild dizziness with vertical and horizontal VOR, possible positive R HIT. Positional testing was negative. Patient was educated on gentle VOR and habituation HEP and reported understanding. Would benefit from skilled PT services PRN for 4 weeks to address aforementioned impairments in order to optimize level of function.     OBJECTIVE IMPAIRMENTS dizziness.   ACTIVITY LIMITATIONS bed mobility  PARTICIPATION LIMITATIONS: yard work  PERSONAL FACTORS Past/current experiences, Time since onset of injury/illness/exacerbation, and 3+ comorbidities: anxiety, GERD, HLD, polyarthralgia, Lyme disease, spinal fusion 2008  are also affecting patient's functional outcome.   REHAB POTENTIAL: Good  CLINICAL DECISION MAKING: Evolving/moderate complexity  EVALUATION COMPLEXITY: Moderate   PLAN: PT FREQUENCY:  PRN  PT DURATION: 4 weeks  PLANNED INTERVENTIONS: Therapeutic exercises, Therapeutic activity, Neuromuscular re-education, Balance training, Gait training, Patient/Family education, Self Care, Joint mobilization, Vestibular training, Canalith repositioning,  Aquatic Therapy, Dry Needling, Cryotherapy, Moist heat, Taping, Manual therapy, and Re-evaluation  PLAN FOR NEXT SESSION: treat dizziness PRN if BPPV returns     Janene Harvey, [place], DPT 09/16/21 11:49 AM  [facility] [address] [place], [place] [address] Phone # [phone] Fax # [phone]

## 2021-09-14 NOTE — Progress Notes (Signed)
Please inform patient of the following:  Labs are all stable.  Would like for her to continue to work on diet and exercise and we can recheck in a year.

## 2021-09-16 ENCOUNTER — Encounter: Payer: Self-pay | Admitting: Physical Therapy

## 2021-09-16 ENCOUNTER — Other Ambulatory Visit: Payer: Self-pay

## 2021-09-16 ENCOUNTER — Ambulatory Visit: Payer: 59 | Attending: Family Medicine | Admitting: Physical Therapy

## 2021-09-16 DIAGNOSIS — R42 Dizziness and giddiness: Secondary | ICD-10-CM

## 2021-09-27 ENCOUNTER — Other Ambulatory Visit (HOSPITAL_COMMUNITY)
Admission: RE | Admit: 2021-09-27 | Discharge: 2021-09-27 | Disposition: A | Discharge status: Home / Self Care | Payer: 59 | Source: Ambulatory Visit | Attending: Obstetrics & Gynecology | Admitting: Obstetrics & Gynecology

## 2021-09-27 ENCOUNTER — Encounter (HOSPITAL_BASED_OUTPATIENT_CLINIC_OR_DEPARTMENT_OTHER): Payer: Self-pay | Admitting: Obstetrics & Gynecology

## 2021-09-27 ENCOUNTER — Ambulatory Visit (HOSPITAL_BASED_OUTPATIENT_CLINIC_OR_DEPARTMENT_OTHER): Payer: 59

## 2021-09-27 DIAGNOSIS — N898 Other specified noninflammatory disorders of vagina: Secondary | ICD-10-CM | POA: Insufficient documentation

## 2021-09-27 MED ORDER — FLUCONAZOLE 150 MG PO TABS: 150.0000 mg | ORAL_TABLET | Freq: Once | ORAL | 0 refills | Status: DC

## 2021-09-27 NOTE — Progress Notes (Signed)
Patient came in today with complaints of vaginal itching. She also states that she just finished taking an antibiotic. She almost always get a yeast infection. Patient says she has diflucan at home but it has expired. Aptima swab obtained. tbw

## 2021-09-29 ENCOUNTER — Encounter (HOSPITAL_BASED_OUTPATIENT_CLINIC_OR_DEPARTMENT_OTHER): Payer: Self-pay | Admitting: Obstetrics & Gynecology

## 2021-09-29 LAB — CERVICOVAGINAL ANCILLARY ONLY
Bacterial Vaginitis (gardnerella): NEGATIVE
Candida Glabrata: NEGATIVE
Candida Vaginitis: POSITIVE — AB
Comment: NEGATIVE
Comment: NEGATIVE
Comment: NEGATIVE

## 2021-09-29 MED ORDER — FLUCONAZOLE 150 MG PO TABS: 150.0000 mg | ORAL_TABLET | Freq: Once | ORAL | 1 refills | Status: AC

## 2021-09-29 NOTE — Addendum Note (Signed)
Addended by: Megan Salon on: 09/29/2021 05:17 PM   Modules accepted: Orders

## 2021-10-31 ENCOUNTER — Encounter: Payer: Self-pay | Admitting: Gastroenterology

## 2021-11-05 ENCOUNTER — Other Ambulatory Visit: Payer: Self-pay | Admitting: Family Medicine

## 2021-11-05 ENCOUNTER — Other Ambulatory Visit: Payer: Self-pay | Admitting: Gastroenterology

## 2021-12-15 ENCOUNTER — Ambulatory Visit (INDEPENDENT_AMBULATORY_CARE_PROVIDER_SITE_OTHER): Payer: 59 | Admitting: Gastroenterology

## 2021-12-15 ENCOUNTER — Other Ambulatory Visit (INDEPENDENT_AMBULATORY_CARE_PROVIDER_SITE_OTHER): Payer: 59

## 2021-12-15 ENCOUNTER — Encounter: Payer: Self-pay | Admitting: Gastroenterology

## 2021-12-15 DIAGNOSIS — K582 Mixed irritable bowel syndrome: Secondary | ICD-10-CM | POA: Diagnosis not present

## 2021-12-15 DIAGNOSIS — K5180 Other ulcerative colitis without complications: Secondary | ICD-10-CM

## 2021-12-15 LAB — B12 AND FOLATE PANEL
Folate: >23.8 ng/mL (ref >5.9)
Vitamin B-12: 352 pg/mL (ref 211–911)

## 2021-12-15 LAB — IBC + FERRITIN
Ferritin: 156.2 ng/mL (ref 10.0–291.0)
Iron: 118 ug/dL (ref 42–145)
Saturation Ratios: 38.8 % (ref 20.0–50.0)
TIBC: 303.8 ug/dL (ref 250.0–450.0)
Transferrin: 217 mg/dL (ref 212.0–360.0)

## 2021-12-15 LAB — C-REACTIVE PROTEIN: CRP: <1 mg/dL (ref 0.5–20.0)

## 2021-12-15 MED ORDER — MESALAMINE 1.2 G PO TBEC: DELAYED_RELEASE_TABLET | ORAL | 6 refills | Status: DC

## 2021-12-15 MED ORDER — BENEFIBER PO POWD: ORAL | 0 refills | Status: DC

## 2021-12-15 NOTE — Progress Notes (Signed)
Alison Fleming    [id number]    1975/12/25  Primary Care Physician:Parker, Algis Greenhouse, MD  Referring Physician: Vivi Barrack, MD [address] [place],  [place] [address]   Chief complaint: Ulcerative colitis HPI:  46 year old very pleasant female here for follow-up for ulcerative colitis  She had a bad episode of vertigo, was in the ER in August, was subsequently treated with antibiotics complicated by yeast infection and antifungal treatment. Has noticed changes in her bowel habits following antibiotics, feels her bowel habits are slowly coming back to baseline  She is currently on mesalamine 2.4 g daily She is having semiformed stool, on average 1-3 bowel movements per day.  She has occasional episodes of diarrhea once or twice a month, no identifiable triggers.  She takes probiotics occasionally.  No rectal bleeding.  No abdominal pain, nausea or vomiting.  No joint pains or skin rash.   Reviewed and discussed labs July 14, 2021: Fecal calprotectin undetectable.  Sed rate 12.  CBC and CMP within normal range   Colonoscopy May 17, 2020 for rectal bleeding and change in bowel habits - One 5 mm polyp in the cecum, removed with a cold snare. Resected and retrieved. - Erythematous mucosa in the rectum, in the sigmoid colon, in the descending colon, in the transverse colon, in the ascending colon and in the cecum. Biopsied. - Scar in the rectum. Biopsied. - Non-bleeding internal hemorrhoid   1. Surgical [P], colon, cecum, polyp (1) - SESSILE SERRATED POLYP WITHOUT CYTOLOGIC DYSPLASIA (ONE). 2. Surgical [P], right colon -mild, patchy erythema - FOCAL ACTIVE COLITIS WITH MILD CHRONIC CHANGES. - NO GRANULOMAS OR DYSPLASIA. - SEE MICROSCOPIC DESCRIPTION. 3. Surgical [P], left colon-erythema, proctis - FOCAL ACTIVE COLITIS WITH MINIMAL CHRONIC CHANGES. - NO GRANULOMAS OR DYSPLASIA. - SEE MICROSCOPIC DESCRIPTION.   Colonoscopy November 28, 2017 for change in bowel  habits - A single erosion in the terminal ileum. Biopsied. - Patchy mild inflammation was found in the rectum secondary to proctitis. Biopsied. - The entire examined colon is normal. Biopsied. - Non-bleeding internal hemorrhoids.   1. Surgical [P], terminal ileum - ILEAL MUCOSA WITH NO SPECIFIC HISTOPATHOLOGIC CHANGES. SEE NOTE. 2. Surgical [P], right colon - COLONIC MUCOSA WITH NO SPECIFIC HISTOPATHOLOGIC CHANGES. SEE NOTE. 3. Surgical [P], left colon BX - COLONIC MUCOSA WITH NO SPECIFIC HISTOPATHOLOGIC CHANGES. SEE NOTE. 4. Surgical [P], rectum - RECTAL MUCOSA WITH NO SPECIFIC HISTOPATHOLOGIC CHANGES. SEE NOTE.   1. -4. The ileocolonic biopsies are negative for acute inflammation, features of chronicity or granulomas.   EGD March 08, 2017: Normal   Abdominal ultrasound January 03, 2017: Normal   Outpatient Encounter Medications as of 12/15/2021  Medication Sig   COLLAGEN PO Take 2,000 mg by mouth daily.   CRANBERRY PO Take 1 capsule by mouth once a week.   cyclobenzaprine (FLEXERIL) 10 MG tablet Take 1 tablet (10 mg total) by mouth 3 (three) times daily as needed for muscle spasms.   meclizine (ANTIVERT) 25 MG tablet Take 1 tablet (25 mg total) by mouth 3 (three) times daily as needed for dizziness.   mesalamine (LIALDA) 1.2 g EC tablet TAKE 4 TABLETS BY MOUTH DAILY WITH BREAKFAST.   MICROGESTIN 1-20 MG-MCG tablet Take 1 tablet by mouth daily.   Multiple Vitamins-Minerals (MULTIVITAMIN GUMMIES WOMENS PO) Take 2 tablets by mouth daily.   omeprazole (PRILOSEC) 40 MG capsule TAKE 1 CAPSULE BY MOUTH EVERY DAY   ondansetron (ZOFRAN-ODT) 4 MG  disintegrating tablet Take 1 tablet (4 mg total) by mouth every 8 (eight) hours as needed for nausea or vomiting.   valACYclovir (VALTREX) 1000 MG tablet Take 2 tabs po x 1, repeat in 12 hours with symptom onset.   [DISCONTINUED] amoxicillin-clavulanate (AUGMENTIN) 875-125 MG tablet Take 1 tablet by mouth every 12 (twelve) hours.   No  facility-administered encounter medications on file as of 12/15/2021.    Allergies as of 12/15/2021   (No Known Allergies)    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   Anxiety    Atypical chest pain 04/24/2017   Dyspepsia, s/p normal EGD 03/08/17, with recommendation for lifetime anti-reflux regimen 03/26/2017   GAD (generalized anxiety disorder) 05/10/2016   GERD (gastroesophageal reflux disease)    History of spinal fusion for scoliosis 03/26/2017   HSV infection    Hyperlipidemia    Left upper quadrant pain 03/26/2017   Malaise and fatigue 03/26/2017   Paresthesias/numbness 04/24/2017   Polyarthralgia 03/28/2017   Positive Lyme disease serology    Seasonal allergies    Tension headache 03/26/2017   Vertigo     Past Surgical History:  Procedure Laterality Date   COLONOSCOPY  2019   DILATATION & CURETTAGE/HYSTEROSCOPY WITH MYOSURE N/A 07/13/2020   Procedure: [place];  Surgeon: Megan Salon, MD;  Location: [place];  Service: Gynecology;  Laterality: N/A;   DILATATION & CURRETTAGE/HYSTEROSCOPY WITH RESECTOCOPE  02/12/2012   Procedure: [place];  Surgeon: Azalia Bilis, MD;  Location: [place] [facility];  Service: Gynecology;  Laterality: N/A;   DILATION AND CURETTAGE OF UTERUS  2010   DILATION AND CURETTAGE OF UTERUS  2012   SPINAL FUSION  2008    Family History  Problem Relation Age of Onset   Diabetes Maternal Grandmother    Hypertension Maternal Grandmother    Heart disease Maternal Grandmother    Heart attack Maternal Grandmother    Osteoporosis Maternal Grandmother    Diabetes Maternal Grandfather    Hypertension Maternal Grandfather    Heart disease Maternal Grandfather    Heart attack Maternal Grandfather    Hypertension Mother    Anxiety disorder Mother    Diabetes Mother    Heart murmur Brother    Colon cancer Neg Hx    Esophageal cancer Neg Hx     Rectal cancer Neg Hx    Stomach cancer Neg Hx    Breast cancer Neg Hx    Colon polyps Neg Hx     Social History   Socioeconomic History   Marital status: Married    Spouse name: Not on file   Number of children: 2   Years of education: Not on file   Highest education level: Not on file  Occupational History   Occupation: HUMAN RESOURCES    Employer: [employer]  Tobacco Use   Smoking status: Never   Smokeless tobacco: Never  Vaping Use   Vaping Use: Never used  Substance and Sexual Activity   Alcohol use: Yes    Alcohol/week: 5.0 - 6.0 standard drinks of alcohol    Types: 5 - 6 Standard drinks or equivalent per week   Drug use: No   Sexual activity: Yes    Partners: Male    Birth control/protection: OCP  Other Topics Concern   Not on file  Social History Narrative   Not on file   Social Determinants of Health   Financial Resource Strain: Not on  file  Food Insecurity: Not on file  Transportation Needs: Not on file  Physical Activity: Not on file  Stress: Not on file  Social Connections: Not on file  Intimate Partner Violence: Not on file      Review of systems: All other review of systems negative except as mentioned in the HPI.   Physical Exam: Vitals:   12/15/21 0927  BP: 100/60  Pulse: 83   Body mass index is 24.53 kg/m. Gen:      No acute distress HEENT:  sclera anicteric Abd:      soft, non-tender; no palpable masses, no distension Ext:    No edema Neuro: alert and oriented x 3 Psych: normal mood and affect  Data Reviewed:  Reviewed labs, radiology imaging, old records and pertinent past GI work up   Assessment and Plan/Recommendations:  46 year old very pleasant female with history of ulcerative colitis, currently in clinical remission after course of budesonide and currently on mesalamine daily for maintenance   Continue Lialda to 2.4 g daily.      Change in stool consistency: Advised patient to increase dietary  fiber and water intake.  Use Benefiber or psyllium husk capsule 1-2 daily as needed  Consider eating different variety of fruits and vegetables, fermented foods, yogurt drink kefir to improve gut microbiome   IBD health maintenance follow-up B12, iron panel and CRP   Return in 6 months   The patient was provided an opportunity to ask questions and all were answered. The patient agreed with the plan and demonstrated an understanding of the instructions.  Damaris Hippo , MD    CC: Vivi Barrack, MD

## 2021-12-15 NOTE — Patient Instructions (Addendum)
If you are age 45 or younger, your body mass index should be between 19-25. Your Body mass index is 24.53 kg/m. If this is out of the aformentioned range listed, please consider follow up with your Primary Care Provider.  ________________________________________________________  The [place] [facility] providers would like to encourage you to use [facility] to communicate with providers for non-urgent requests or questions.  Due to long hold times on the telephone, sending your provider a message by [facility] may be a faster and more efficient way to get a response.  Please allow 48 business hours for a response.  Please remember that this is for non-urgent requests.  _______________________________________________________  Your provider has requested that you go to the basement level for lab work before leaving today. Press "B" on the elevator. The lab is located at the first door on the left as you exit the elevator.  Due to recent changes in healthcare laws, you may see the results of your imaging and laboratory studies on MyChart before your provider has had a chance to review them.  We understand that in some cases there may be results that are confusing or concerning to you. Not all laboratory results come back in the same time frame and the provider may be waiting for multiple results in order to interpret others.  Please give [place] 48 hours in order for your provider to thoroughly review all the results before contacting the office for clarification of your results.   We have sent the following medications to your pharmacy for you to pick up at your convenience: Mesalamine  Please purchase the following medications over the counter and take as directed: USE: Benefiber 1 tablespoon twice daily  You will be due for follow up in  6 months (May 2024).  We will contact you to schedule this appointment.  Thank you for entrusting me with your care and choosing [facility].  Dr Silverio Decamp

## 2022-01-04 ENCOUNTER — Other Ambulatory Visit: Payer: Self-pay | Admitting: Obstetrics & Gynecology

## 2022-01-04 DIAGNOSIS — Z1231 Encounter for screening mammogram for malignant neoplasm of breast: Secondary | ICD-10-CM

## 2022-02-12 ENCOUNTER — Other Ambulatory Visit: Payer: Self-pay | Admitting: Family Medicine

## 2022-03-09 ENCOUNTER — Ambulatory Visit (INDEPENDENT_AMBULATORY_CARE_PROVIDER_SITE_OTHER): Payer: 59 | Admitting: Obstetrics & Gynecology

## 2022-03-09 ENCOUNTER — Encounter (HOSPITAL_BASED_OUTPATIENT_CLINIC_OR_DEPARTMENT_OTHER): Payer: Self-pay | Admitting: Obstetrics & Gynecology

## 2022-03-09 VITALS — BP 103/68 | HR 73 | Ht 66.5 in | Wt 155.4 lb

## 2022-03-09 DIAGNOSIS — K5180 Other ulcerative colitis without complications: Secondary | ICD-10-CM | POA: Diagnosis not present

## 2022-03-09 DIAGNOSIS — R1032 Left lower quadrant pain: Secondary | ICD-10-CM

## 2022-03-09 NOTE — Progress Notes (Signed)
GYNECOLOGY  VISIT  CC:   LLQ pain  HPI: 47 y.o. G61P2012 Married White or Caucasian female here for complaint of sharp, stabbing pain in the RLQ when she had her last menstrual cycle.  Menstrual flow was lighter than normal and lasted 2 1/2 days.  Next cycle should start 2/22 but she is stopping a little bit today but had to strain with a bowel movement today.  Used to have this more commonly but hasn't had that in a long time.  Last pap smear 05/02/2021.  WNL.  Does have AEX scheduled 04/2022.  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   Anxiety    Atypical chest pain 04/24/2017   Dyspepsia, s/p normal EGD 03/08/17, with recommendation for lifetime anti-reflux regimen 03/26/2017   GAD (generalized anxiety disorder) 05/10/2016   History of spinal fusion for scoliosis 03/26/2017   HSV infection    Hyperlipidemia    Polyarthralgia 03/28/2017   Positive Lyme disease serology    Seasonal allergies    Tension headache 03/26/2017   Ulcerative colitis ([place])    Vertigo     MEDS:   Current Outpatient Medications on File Prior to Visit  Medication Sig Dispense Refill   COLLAGEN PO Take 2,000 mg by mouth daily.     CRANBERRY PO Take 1 capsule by mouth once a week.     cyclobenzaprine (FLEXERIL) 10 MG tablet Take 1 tablet (10 mg total) by mouth 3 (three) times daily as needed for muscle spasms. 30 tablet 1   meclizine (ANTIVERT) 25 MG tablet Take 1 tablet (25 mg total) by mouth 3 (three) times daily as needed for dizziness. 30 tablet 0   mesalamine (LIALDA) 1.2 g EC tablet TAKE 4 TABLETS BY MOUTH DAILY WITH BREAKFAST. 120 tablet 6   MICROGESTIN 1-20 MG-MCG tablet Take 1 tablet by mouth daily. 84 tablet 4   Multiple Vitamins-Minerals (MULTIVITAMIN GUMMIES WOMENS PO) Take 2 tablets by mouth daily.     omeprazole (PRILOSEC) 40 MG capsule TAKE 1 CAPSULE BY MOUTH EVERY DAY 30 capsule 2   ondansetron (ZOFRAN-ODT) 4 MG disintegrating tablet Take 1 tablet (4 mg total) by  mouth every 8 (eight) hours as needed for nausea or vomiting. 12 tablet 0   valACYclovir (VALTREX) 1000 MG tablet Take 2 tabs po x 1, repeat in 12 hours with symptom onset. 10 tablet 3   Wheat Dextrin (BENEFIBER) POWD Take 1 tablespoon two times daily  0   No current facility-administered medications on file prior to visit.    ALLERGIES: Patient has no known allergies.  SH:  married, non smoker  Review of Systems  Constitutional: Negative.   Genitourinary:        LLQ pain    PHYSICAL EXAMINATION:    BP 103/68 (BP Location: Right Arm, Patient Position: Sitting, Cuff Size: Large)   Pulse 73   Ht 5' 6.5" (1.689 m) Comment: Reported  Wt 155 lb 6.4 oz (70.5 kg)   LMP 02/13/2022   BMI 24.71 kg/m     General appearance: alert, cooperative and appears stated age Lymph:  no inguinal LAD noted  Pelvic: External genitalia:  no lesions              Urethra:  normal appearing urethra with no masses, tenderness or lesions              Bartholins and Skenes: normal  Vagina: normal appearing vagina with normal color and discharge, no lesions              Cervix: no lesions              Bimanual Exam:  Uterus:  normal size, contour, position, consistency, mobility, non-tender              Adnexa: no mass, fullness, tenderness               Chaperone, Octaviano Batty, CMA, was present for exam.  Assessment/Plan: 1. LLQ pain - pt reassured that exam is normal today.  No pelvic floor pain present.  Pt will monitor through next few cycles and if has recurrence of pain, will proceed with pelvic ultrasound.  Pt has hx of simple ovarian cyst in the past.  Possible causes of pain discussed.    2. Right sided ulcerative colitis ([place])

## 2022-03-10 ENCOUNTER — Encounter (HOSPITAL_BASED_OUTPATIENT_CLINIC_OR_DEPARTMENT_OTHER): Payer: Self-pay | Admitting: Obstetrics & Gynecology

## 2022-03-14 ENCOUNTER — Encounter: Payer: Self-pay | Admitting: Gastroenterology

## 2022-03-15 ENCOUNTER — Ambulatory Visit
Admission: RE | Admit: 2022-03-15 | Discharge: 2022-03-15 | Disposition: A | Discharge status: Home / Self Care | Payer: 59 | Source: Ambulatory Visit | Attending: Obstetrics & Gynecology | Admitting: Obstetrics & Gynecology

## 2022-03-15 DIAGNOSIS — Z1231 Encounter for screening mammogram for malignant neoplasm of breast: Secondary | ICD-10-CM

## 2022-03-20 ENCOUNTER — Encounter: Payer: Self-pay | Admitting: Gastroenterology

## 2022-03-23 ENCOUNTER — Telehealth: Payer: Self-pay | Admitting: Gastroenterology

## 2022-03-23 NOTE — Telephone Encounter (Signed)
Received a MyChart message from patient saying she was supposed to have a follow up appointment with Dr. Silverio Decamp.  She called now thinking she could get an appointment in early to mid-May which is when she was due to be seen.  However, Dr. Woodward Ku first appointment we have available to offer is in July.  Patient would like to be seen in May sometime.  Can you accommodate her with that?  Also, prior to her appointment with Dr. Silverio Decamp she needs to have bloodwork and stool sample testing completed so that she can discuss them with the doctor during their appointment.  Can you help her with that as well?  Please call patient and advise.  Thank you.

## 2022-03-24 NOTE — Telephone Encounter (Signed)
Left message on machine to call back  

## 2022-03-27 ENCOUNTER — Encounter: Payer: Self-pay | Admitting: Gastroenterology

## 2022-03-28 NOTE — Telephone Encounter (Signed)
Agree with annual IBD health maintenance labs (CBC, CMP, CRP, B12, iron panel, vit D and fecal calprotectin) and follow up office visit as scheduled. Thanks

## 2022-03-28 NOTE — Telephone Encounter (Signed)
I have spoken with the patient. Scheduled her for the 6 month follow up of her UC in June. May would have been 5 months.  The patient is due for a recheck of her B12 level in June. Can we get her yearly labs, including the fecal calprotectin, as well?

## 2022-03-29 ENCOUNTER — Other Ambulatory Visit: Payer: Self-pay

## 2022-03-29 DIAGNOSIS — E538 Deficiency of other specified B group vitamins: Secondary | ICD-10-CM

## 2022-03-29 DIAGNOSIS — E559 Vitamin D deficiency, unspecified: Secondary | ICD-10-CM

## 2022-03-29 DIAGNOSIS — K51018 Ulcerative (chronic) pancolitis with other complication: Secondary | ICD-10-CM

## 2022-03-29 NOTE — Telephone Encounter (Signed)
Orders entered into EPIC. Patient instructed to come for the labs in June.

## 2022-05-15 ENCOUNTER — Ambulatory Visit (INDEPENDENT_AMBULATORY_CARE_PROVIDER_SITE_OTHER): Payer: 59 | Admitting: Obstetrics & Gynecology

## 2022-05-15 ENCOUNTER — Other Ambulatory Visit (HOSPITAL_COMMUNITY)
Admission: RE | Admit: 2022-05-15 | Discharge: 2022-05-15 | Disposition: A | Discharge status: Home / Self Care | Payer: 59 | Source: Ambulatory Visit | Attending: Obstetrics & Gynecology | Admitting: Obstetrics & Gynecology

## 2022-05-15 ENCOUNTER — Encounter (HOSPITAL_BASED_OUTPATIENT_CLINIC_OR_DEPARTMENT_OTHER): Payer: Self-pay | Admitting: Obstetrics & Gynecology

## 2022-05-15 VITALS — BP 111/75 | HR 83 | Ht 66.0 in | Wt 153.6 lb

## 2022-05-15 DIAGNOSIS — Z3041 Encounter for surveillance of contraceptive pills: Secondary | ICD-10-CM | POA: Diagnosis not present

## 2022-05-15 DIAGNOSIS — Z9889 Other specified postprocedural states: Secondary | ICD-10-CM

## 2022-05-15 DIAGNOSIS — Z01419 Encounter for gynecological examination (general) (routine) without abnormal findings: Secondary | ICD-10-CM

## 2022-05-15 DIAGNOSIS — Z124 Encounter for screening for malignant neoplasm of cervix: Secondary | ICD-10-CM | POA: Diagnosis present

## 2022-05-15 DIAGNOSIS — B009 Herpesviral infection, unspecified: Secondary | ICD-10-CM

## 2022-05-15 DIAGNOSIS — B001 Herpesviral vesicular dermatitis: Secondary | ICD-10-CM

## 2022-05-15 MED ORDER — MICROGESTIN 1/20 1-20 MG-MCG PO TABS: 1.0000 | ORAL_TABLET | Freq: Every day | ORAL | 4 refills | Status: DC

## 2022-05-15 MED ORDER — VALACYCLOVIR HCL 1 G PO TABS: ORAL_TABLET | ORAL | 1 refills | Status: DC

## 2022-05-15 NOTE — Progress Notes (Signed)
47 y.o. [id number] Married White or Caucasian female here for annual exam.  Doing well.  Was seen in February for LLQ pain that was during her menstrual cycle.  She has not had this pain return.  Cycles have been regular, for the most part, except for early April when she missed a pill and had another menstrual cycle.  She had some spotting in February with straining with a bowel movement but this has not occurred since then as well.  Needs RF for microgestin.  Uses brand name microgestin.  Also needs RF for valtrex for fever blisters.  Patient's last menstrual period was 05/08/2022 (exact date).          Sexually active: Yes.    The current method of family planning is OCP (estrogen/progesterone).     Upstream - 05/15/22 4540       Pregnancy Intention Screening   Does the patient want to become pregnant in the next year? No    Does the patient's partner want to become pregnant in the next year? No    Would the patient like to discuss contraceptive options today? Yes      Contraception Wrap Up   Current Method Oral Contraceptive            Smoker:  no  Health Maintenance: Pap:  05/02/21 neg History of abnormal Pap:  no MMG:  03/15/22 neg Colonoscopy:  05/17/2020 Screening Labs: does with Dr. Jimmey Ralph (upcoming appt in August) and Dr. Lavon Paganini (upcoming appt this summer)   reports that she has never smoked. She has never used smokeless tobacco. She reports current alcohol use of about 2.0 - 3.0 standard drinks of alcohol per week. She reports that she does not use drugs.  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   Anxiety    Atypical chest pain 04/24/2017   Dyspepsia, s/p normal EGD 03/08/17, with recommendation for lifetime anti-reflux regimen 03/26/2017   GAD (generalized anxiety disorder) 05/10/2016   History of spinal fusion for scoliosis 03/26/2017   HSV infection    Hyperlipidemia    Polyarthralgia 03/28/2017   Positive Lyme disease serology     Seasonal allergies    Tension headache 03/26/2017   Ulcerative colitis (HCC)    Vertigo     Past Surgical History:  Procedure Laterality Date   COLONOSCOPY  2019   DILATATION & CURETTAGE/HYSTEROSCOPY WITH MYOSURE N/A 07/13/2020   Procedure: DILATATION & CURETTAGE/HYSTEROSCOPY WITH MYOSURE;  Surgeon: Jerene Bears, MD;  Location: [facility] OR;  Service: Gynecology;  Laterality: N/A;   DILATATION & CURRETTAGE/HYSTEROSCOPY WITH RESECTOCOPE  02/12/2012   Procedure: DILATATION & CURETTAGE/HYSTEROSCOPY WITH RESECTOCOPE;  Surgeon: Bennye Alm, MD;  Location: [facility] ORS;  Service: Gynecology;  Laterality: N/A;   DILATION AND CURETTAGE OF UTERUS  2010   DILATION AND CURETTAGE OF UTERUS  2012   SPINAL FUSION  2008    Current Outpatient Medications  Medication Sig Dispense Refill   CRANBERRY PO Take 1 capsule by mouth once a week.     cyclobenzaprine (FLEXERIL) 10 MG tablet Take 1 tablet (10 mg total) by mouth 3 (three) times daily as needed for muscle spasms. 30 tablet 1   mesalamine (LIALDA) 1.2 g EC tablet TAKE 4 TABLETS BY MOUTH DAILY WITH BREAKFAST. 120 tablet 6   MICROGESTIN 1-20 MG-MCG tablet Take 1 tablet by mouth daily. 84 tablet 4   Multiple Vitamins-Minerals (MULTIVITAMIN GUMMIES WOMENS PO) Take 2 tablets by mouth daily.     omeprazole (  PRILOSEC) 40 MG capsule TAKE 1 CAPSULE BY MOUTH EVERY DAY 30 capsule 2   COLLAGEN PO Take 2,000 mg by mouth daily. (Patient not taking: Reported on 05/15/2022)     valACYclovir (VALTREX) 1000 MG tablet Take 2 tabs po x 1, repeat in 12 hours with symptom onset. (Patient not taking: Reported on 05/15/2022) 10 tablet 3   Wheat Dextrin (BENEFIBER) POWD Take 1 tablespoon two times daily (Patient not taking: Reported on 05/15/2022)  0   No current facility-administered medications for this visit.    Family History  Problem Relation Age of Onset   Diabetes Maternal Grandmother    Hypertension Maternal Grandmother    Heart disease Maternal Grandmother    Heart  attack Maternal Grandmother    Osteoporosis Maternal Grandmother    Diabetes Maternal Grandfather    Hypertension Maternal Grandfather    Heart disease Maternal Grandfather    Heart attack Maternal Grandfather    Hypertension Mother    Anxiety disorder Mother    Diabetes Mother    Heart murmur Brother    Colon cancer Neg Hx    Esophageal cancer Neg Hx    Rectal cancer Neg Hx    Stomach cancer Neg Hx    Breast cancer Neg Hx    Colon polyps Neg Hx     ROS: Constitutional: negative Genitourinary: irregular bleeding this month with missing OCP dose  Exam:   BP 111/75   Pulse 83   Ht 5\' 6"  (1.676 m)   Wt 153 lb 9.6 oz (69.7 kg)   LMP 05/08/2022 (Exact Date)   BMI 24.79 kg/m   Height: 5\' 6"  (167.6 cm)  General appearance: alert, cooperative and appears stated age Head: Normocephalic, without obvious abnormality, atraumatic Neck: no adenopathy, supple, symmetrical, trachea midline and thyroid normal to inspection and palpation Lungs: clear to auscultation bilaterally Breasts: normal appearance, no masses or tenderness Heart: regular rate and rhythm Abdomen: soft, non-tender; bowel sounds normal; no masses,  no organomegaly Extremities: extremities normal, atraumatic, no cyanosis or edema Skin: Skin color, texture, turgor normal. No rashes or lesions Lymph nodes: Cervical, supraclavicular, and axillary nodes normal. No abnormal inguinal nodes palpated Neurologic: Grossly normal   Pelvic: External genitalia:  no lesions              Urethra:  normal appearing urethra with no masses, tenderness or lesions              Bartholins and Skenes: normal                 Vagina: normal appearing vagina with normal color and no discharge, no lesions              Cervix: no lesions              Pap taken: Yes.   Bimanual Exam:  Uterus:  normal size, contour, position, consistency, mobility, non-tender              Adnexa: normal adnexa and no mass, fullness, tenderness                Rectovaginal: Confirms               Anus:  normal sphincter tone, no lesions  Chaperone, Raechel Ache, RN, was present for exam.  Assessment/Plan: 1. Well woman exam with routine gynecological exam - Pap smear with HR HPV obtained - Mammogram 02/2022 - Colonoscopy 05/2020 with Dr. Lavon Paganini - lab work done with PCP, Dr. Jimmey Ralph -  vaccines reviewed/updated  2. HSV-1 infection - valACYclovir (VALTREX) 1000 MG tablet; Take 2 tabs po x 1, repeat in 12 hours with symptom onset.  Dispense: 30 tablet; Refill: 1  3. Recurrent cold sores  4. Encounter for surveillance of contraceptive pills - MICROGESTIN 1-20 MG-MCG tablet; Take 1 tablet by mouth daily.  Dispense: 84 tablet; Refill: 4  5. Cervical cancer screening - Cytology - PAP( [place])  6. History of hysteroscopy

## 2022-05-16 LAB — CYTOLOGY - PAP
Comment: NEGATIVE
Diagnosis: NEGATIVE
High risk HPV: NEGATIVE

## 2022-05-25 ENCOUNTER — Other Ambulatory Visit: Payer: Self-pay | Admitting: Family Medicine

## 2022-06-06 ENCOUNTER — Telehealth (HOSPITAL_BASED_OUTPATIENT_CLINIC_OR_DEPARTMENT_OTHER): Payer: Self-pay | Admitting: *Deleted

## 2022-06-06 NOTE — Telephone Encounter (Signed)
Pt called with complaints of pelvic pressure yesterday which she thought may have been a UTI. She did not have the burning that she usually does. She states that she started her cycle today and the pressure is better. She will call back for appt if symptoms worsen.

## 2022-06-09 ENCOUNTER — Other Ambulatory Visit (INDEPENDENT_AMBULATORY_CARE_PROVIDER_SITE_OTHER): Payer: 59

## 2022-06-09 DIAGNOSIS — E559 Vitamin D deficiency, unspecified: Secondary | ICD-10-CM

## 2022-06-09 DIAGNOSIS — K51018 Ulcerative (chronic) pancolitis with other complication: Secondary | ICD-10-CM | POA: Diagnosis not present

## 2022-06-09 DIAGNOSIS — E538 Deficiency of other specified B group vitamins: Secondary | ICD-10-CM | POA: Diagnosis not present

## 2022-06-09 LAB — COMPREHENSIVE METABOLIC PANEL
ALT: 15 U/L (ref 0–35)
AST: 17 U/L (ref 0–37)
Albumin: 4.2 g/dL (ref 3.5–5.2)
Alkaline Phosphatase: 44 U/L (ref 39–117)
BUN: 13 mg/dL (ref 6–23)
CO2: 25 mEq/L (ref 19–32)
Calcium: 9.3 mg/dL (ref 8.4–10.5)
Chloride: 102 mEq/L (ref 96–112)
Creatinine, Ser: 0.86 mg/dL (ref 0.40–1.20)
GFR: 81.56 mL/min (ref >60.00)
Glucose, Bld: 87 mg/dL (ref 70–99)
Potassium: 3.9 mEq/L (ref 3.5–5.1)
Sodium: 139 mEq/L (ref 135–145)
Total Bilirubin: 0.5 mg/dL (ref 0.2–1.2)
Total Protein: 7.4 g/dL (ref 6.0–8.3)

## 2022-06-09 LAB — HIGH SENSITIVITY CRP: CRP, High Sensitivity: 3.86 mg/L (ref 0.000–5.000)

## 2022-06-09 LAB — CBC WITH DIFFERENTIAL/PLATELET
Basophils Absolute: 0.1 10*3/uL (ref 0.0–0.1)
Basophils Relative: 1.3 % (ref 0.0–3.0)
Eosinophils Absolute: 0.5 10*3/uL (ref 0.0–0.7)
Eosinophils Relative: 7.2 % — ABNORMAL HIGH (ref 0.0–5.0)
HCT: 38.9 % (ref 36.0–46.0)
Hemoglobin: 13.1 g/dL (ref 12.0–15.0)
Lymphocytes Relative: 29 % (ref 12.0–46.0)
Lymphs Abs: 1.9 10*3/uL (ref 0.7–4.0)
MCHC: 33.7 g/dL (ref 30.0–36.0)
MCV: 89.9 fl (ref 78.0–100.0)
Monocytes Absolute: 0.5 10*3/uL (ref 0.1–1.0)
Monocytes Relative: 7.1 % (ref 3.0–12.0)
Neutro Abs: 3.5 10*3/uL (ref 1.4–7.7)
Neutrophils Relative %: 55.4 % (ref 43.0–77.0)
Platelets: 305 10*3/uL (ref 150.0–400.0)
RBC: 4.32 Mil/uL (ref 3.87–5.11)
RDW: 12.9 % (ref 11.5–15.5)
WBC: 6.4 10*3/uL (ref 4.0–10.5)

## 2022-06-09 LAB — VITAMIN B12: Vitamin B-12: 427 pg/mL (ref 211–911)

## 2022-06-09 LAB — IBC + FERRITIN
Ferritin: 127.6 ng/mL (ref 10.0–291.0)
Iron: 109 ug/dL (ref 42–145)
Saturation Ratios: 37.4 % (ref 20.0–50.0)
TIBC: 291.2 ug/dL (ref 250.0–450.0)
Transferrin: 208 mg/dL — ABNORMAL LOW (ref 212.0–360.0)

## 2022-06-09 LAB — VITAMIN D 25 HYDROXY (VIT D DEFICIENCY, FRACTURES): VITD: 38.6 ng/mL (ref 30.00–100.00)

## 2022-06-17 ENCOUNTER — Other Ambulatory Visit (HOSPITAL_BASED_OUTPATIENT_CLINIC_OR_DEPARTMENT_OTHER): Payer: Self-pay | Admitting: Obstetrics & Gynecology

## 2022-06-17 DIAGNOSIS — B001 Herpesviral vesicular dermatitis: Secondary | ICD-10-CM

## 2022-06-17 DIAGNOSIS — B009 Herpesviral infection, unspecified: Secondary | ICD-10-CM

## 2022-06-22 ENCOUNTER — Ambulatory Visit: Payer: 59

## 2022-06-22 DIAGNOSIS — K51018 Ulcerative (chronic) pancolitis with other complication: Secondary | ICD-10-CM

## 2022-06-23 ENCOUNTER — Encounter: Payer: Self-pay | Admitting: Gastroenterology

## 2022-06-27 ENCOUNTER — Ambulatory Visit (INDEPENDENT_AMBULATORY_CARE_PROVIDER_SITE_OTHER): Payer: 59 | Admitting: Gastroenterology

## 2022-06-27 ENCOUNTER — Encounter: Payer: Self-pay | Admitting: Gastroenterology

## 2022-06-27 ENCOUNTER — Ambulatory Visit: Payer: 59 | Admitting: Gastroenterology

## 2022-06-27 VITALS — BP 108/70 | HR 73 | Ht 66.0 in | Wt 153.1 lb

## 2022-06-27 DIAGNOSIS — K5180 Other ulcerative colitis without complications: Secondary | ICD-10-CM | POA: Diagnosis not present

## 2022-06-27 MED ORDER — MESALAMINE 1.2 G PO TBEC: 2.4000 g | DELAYED_RELEASE_TABLET | Freq: Every day | ORAL | 3 refills | Status: AC

## 2022-06-27 NOTE — Progress Notes (Signed)
Alison Fleming    [id number]    [birth year]  Primary Care Physician:Parker, Katina Degree, MD  Referring Physician: Ardith Dark, MD [id number] [place],  [place] [address]   Chief complaint:  Ulcerative Colitis  HPI:  47 year old very pleasant female here for follow-up for ulcerative colitis   Overall she is doing better.  She is currently on 1.2 g of mesalamine since January 2024 and prior to that she was on 2.4 g daily of mesalamine She had significantly altered her diet and had a dry January which improved her bowel habits significantly. Decreased urgency and also having more formed BM, half the time less sticky and messy BM  GI Hx:   Colonoscopy May 17, 2020 for rectal bleeding and change in bowel habits - One 5 mm polyp in the cecum, removed with a cold snare. Resected and retrieved. - Erythematous mucosa in the rectum, in the sigmoid colon, in the descending colon, in the transverse colon, in the ascending colon and in the cecum. Biopsied. - Scar in the rectum. Biopsied. - Non-bleeding internal hemorrhoid   1. Surgical [P], colon, cecum, polyp (1) - SESSILE SERRATED POLYP WITHOUT CYTOLOGIC DYSPLASIA (ONE). 2. Surgical [P], right colon -mild, patchy erythema - FOCAL ACTIVE COLITIS WITH MILD CHRONIC CHANGES. - NO GRANULOMAS OR DYSPLASIA. - SEE MICROSCOPIC DESCRIPTION. 3. Surgical [P], left colon-erythema, proctis - FOCAL ACTIVE COLITIS WITH MINIMAL CHRONIC CHANGES. - NO GRANULOMAS OR DYSPLASIA. - SEE MICROSCOPIC DESCRIPTION.   Colonoscopy November 28, 2017 for change in bowel habits - A single erosion in the terminal ileum. Biopsied. - Patchy mild inflammation was found in the rectum secondary to proctitis. Biopsied. - The entire examined colon is normal. Biopsied. - Non-bleeding internal hemorrhoids.   1. Surgical [P], terminal ileum - ILEAL MUCOSA WITH NO SPECIFIC HISTOPATHOLOGIC CHANGES. SEE NOTE. 2. Surgical [P], right colon - COLONIC MUCOSA WITH  NO SPECIFIC HISTOPATHOLOGIC CHANGES. SEE NOTE. 3. Surgical [P], left colon BX - COLONIC MUCOSA WITH NO SPECIFIC HISTOPATHOLOGIC CHANGES. SEE NOTE. 4. Surgical [P], rectum - RECTAL MUCOSA WITH NO SPECIFIC HISTOPATHOLOGIC CHANGES. SEE NOTE.   1. -4. The ileocolonic biopsies are negative for acute inflammation, features of chronicity or granulomas.   EGD March 08, 2017: Normal   Abdominal ultrasound January 03, 2017: Normal       Outpatient Encounter Medications as of 06/27/2022  Medication Sig   CRANBERRY PO Take 1 capsule by mouth once a week.   mesalamine (LIALDA) 1.2 g EC tablet TAKE 4 TABLETS BY MOUTH DAILY WITH BREAKFAST.   MICROGESTIN 1-20 MG-MCG tablet Take 1 tablet by mouth daily.   Multiple Vitamins-Minerals (MULTIVITAMIN GUMMIES WOMENS PO) Take 2 tablets by mouth daily.   omeprazole (PRILOSEC) 40 MG capsule TAKE 1 CAPSULE BY MOUTH EVERY DAY   valACYclovir (VALTREX) 1000 MG tablet TAKE 2 TABS BY MOUTH X 1, REPEAT IN 12 HOURS WITH SYMPTOM ONSET.   [DISCONTINUED] COLLAGEN PO Take 2,000 mg by mouth daily. (Patient not taking: Reported on 05/15/2022)   [DISCONTINUED] cyclobenzaprine (FLEXERIL) 10 MG tablet Take 1 tablet (10 mg total) by mouth 3 (three) times daily as needed for muscle spasms.   [DISCONTINUED] Wheat Dextrin (BENEFIBER) POWD Take 1 tablespoon two times daily (Patient not taking: Reported on 05/15/2022)   No facility-administered encounter medications on file as of 06/27/2022.    Allergies as of 06/27/2022   (No Known Allergies)    Past Medical History:  Diagnosis Date   Adjustment  insomnia 03/26/2017   Anxiety    Atypical chest pain 04/24/2017   Dyspepsia, s/p normal EGD 03/08/17, with recommendation for lifetime anti-reflux regimen 03/26/2017   GAD (generalized anxiety disorder) 05/10/2016   History of spinal fusion for scoliosis 03/26/2017   HSV infection    Hyperlipidemia    Polyarthralgia 03/28/2017   Positive Lyme disease serology    Seasonal  allergies    Tension headache 03/26/2017   Ulcerative colitis (HCC)    Vertigo     Past Surgical History:  Procedure Laterality Date   COLONOSCOPY  2019   DILATATION & CURETTAGE/HYSTEROSCOPY WITH MYOSURE N/A 07/13/2020   Procedure: DILATATION & CURETTAGE/HYSTEROSCOPY WITH MYOSURE;  Surgeon: Jerene Bears, MD;  Location: [facility] OR;  Service: Gynecology;  Laterality: N/A;   DILATATION & CURRETTAGE/HYSTEROSCOPY WITH RESECTOCOPE  02/12/2012   Procedure: DILATATION & CURETTAGE/HYSTEROSCOPY WITH RESECTOCOPE;  Surgeon: Bennye Alm, MD;  Location: [facility] ORS;  Service: Gynecology;  Laterality: N/A;   DILATION AND CURETTAGE OF UTERUS  2010   DILATION AND CURETTAGE OF UTERUS  2012   SPINAL FUSION  2008    Family History  Problem Relation Age of Onset   Diabetes Maternal Grandmother    Hypertension Maternal Grandmother    Heart disease Maternal Grandmother    Heart attack Maternal Grandmother    Osteoporosis Maternal Grandmother    Diabetes Maternal Grandfather    Hypertension Maternal Grandfather    Heart disease Maternal Grandfather    Heart attack Maternal Grandfather    Hypertension Mother    Anxiety disorder Mother    Diabetes Mother    Heart murmur Brother    Colon cancer Neg Hx    Esophageal cancer Neg Hx    Rectal cancer Neg Hx    Stomach cancer Neg Hx    Breast cancer Neg Hx    Colon polyps Neg Hx     Social History   Socioeconomic History   Marital status: Married    Spouse name: Not on file   Number of children: 2   Years of education: Not on file   Highest education level: Not on file  Occupational History   Occupation: HUMAN RESOURCES    Employer: [employer]  Tobacco Use   Smoking status: Never   Smokeless tobacco: Never  Vaping Use   Vaping Use: Never used  Substance and Sexual Activity   Alcohol use: Yes    Alcohol/week: 2.0 - 3.0 standard drinks of alcohol    Types: 2 - 3 Standard drinks or equivalent per week   Drug use: No   Sexual  activity: Yes    Partners: Male    Birth control/protection: OCP  Other Topics Concern   Not on file  Social History Narrative   Not on file   Social Determinants of Health   Financial Resource Strain: Not on file  Food Insecurity: Not on file  Transportation Needs: Not on file  Physical Activity: Not on file  Stress: Not on file  Social Connections: Not on file  Intimate Partner Violence: Not on file      Review of systems: All other review of systems negative except as mentioned in the HPI.   Physical Exam: Vitals:   06/27/22 0927  BP: 108/70  Pulse: 73   Body mass index is 24.72 kg/m. Gen:      No acute distress HEENT:  sclera anicteric Abd:      soft, non-tender; no palpable masses, no distension Ext:  No edema Neuro: alert and oriented x 3 Psych: normal mood and affect  Data Reviewed:  Reviewed labs, radiology imaging, old records and pertinent past GI work up   Assessment and Plan/Recommendations:  47 year old very pleasant female with history of ulcerative colitis, currently in clinical remission after course of budesonide and currently on mesalamine daily for maintenance   Continue Lialda to 1.2 to 2.4 g daily.      History of sessile serrated polyp, due for surveillance colonoscopy in May 2027   IBD health maintenance: follow-up fecal calprotectin, pending.  Rest of the labs reviewed with patient Return in 1 year  . The patient was provided an opportunity to ask questions and all were answered. The patient agreed with the plan and demonstrated an understanding of the instructions.  Iona Beard , MD    CC: Ardith Dark, MD

## 2022-06-27 NOTE — Patient Instructions (Signed)
We have sent the following medications to your pharmacy for you to pick up at your convenience: Lialda  Follow up in 1 year  _______________________________________________________  If your blood pressure at your visit was 140/90 or greater, please contact your primary care physician to follow up on this.  _______________________________________________________  If you are age 46 or older, your body mass index should be between 23-30. Your Body mass index is 24.72 kg/m. If this is out of the aforementioned range listed, please consider follow up with your Primary Care Provider.  If you are age 27 or younger, your body mass index should be between 19-25. Your Body mass index is 24.72 kg/m. If this is out of the aformentioned range listed, please consider follow up with your Primary Care Provider.   ________________________________________________________  The [facility] providers would like to encourage you to use [facility] to communicate with providers for non-urgent requests or questions.  Due to long hold times on the telephone, sending your provider a message by [facility] may be a faster and more efficient way to get a response.  Please allow 48 business hours for a response.  Please remember that this is for non-urgent requests.  _______________________________________________________   I appreciate the  opportunity to care for you  Thank You   Marsa Aris , MD

## 2022-07-02 LAB — CALPROTECTIN, FECAL: Calprotectin, Fecal: 78 ug/g (ref 0–120)

## 2022-07-13 ENCOUNTER — Other Ambulatory Visit (HOSPITAL_BASED_OUTPATIENT_CLINIC_OR_DEPARTMENT_OTHER): Payer: Self-pay | Admitting: Obstetrics & Gynecology

## 2022-07-13 DIAGNOSIS — B009 Herpesviral infection, unspecified: Secondary | ICD-10-CM

## 2022-07-13 DIAGNOSIS — B001 Herpesviral vesicular dermatitis: Secondary | ICD-10-CM

## 2022-07-25 ENCOUNTER — Ambulatory Visit (INDEPENDENT_AMBULATORY_CARE_PROVIDER_SITE_OTHER): Payer: 59 | Admitting: Family

## 2022-07-25 VITALS — BP 110/72 | HR 81 | Temp 97.7°F | Ht 66.0 in | Wt 154.2 lb

## 2022-07-25 DIAGNOSIS — H6121 Impacted cerumen, right ear: Secondary | ICD-10-CM | POA: Diagnosis not present

## 2022-07-25 DIAGNOSIS — H938X1 Other specified disorders of right ear: Secondary | ICD-10-CM

## 2022-07-25 DIAGNOSIS — J0110 Acute frontal sinusitis, unspecified: Secondary | ICD-10-CM

## 2022-07-25 MED ORDER — HYDROCORTISONE-ACETIC ACID 1-2 % OT SOLN: 4.0000 [drp] | Freq: Two times a day (BID) | OTIC | 0 refills | Status: AC

## 2022-07-25 MED ORDER — TRIAMCINOLONE ACETONIDE 55 MCG/ACT NA AERO: 1.0000 | INHALATION_SPRAY | Freq: Every day | NASAL | 2 refills | Status: DC

## 2022-07-25 NOTE — Progress Notes (Signed)
Patient ID: Alison Fleming, female    DOB: 1975-11-21, 47 y.o.   MRN: [id number]  Chief Complaint  Patient presents with   Sinus Problem    Pt c/o right ear pain, Nasal congestion, right ear pain For two weeks after a dentist appointment. Pt states she went to the beach Last Thursday and came back Sunday and did get water in her ears. Has tried ibuprofen which did help pain.     HPI:      Ear & sinus pain:  Pt c/o right ear pain, Nasal congestion, right ear pain For two weeks after a dentist appointment. Pt states she went to the beach Last Thursday and came back Sunday and did get water in her nose & ears when swimming in the ocean. Has tried ibuprofen which did help pain. Denies any nasal drainage, no cough, fever or sore throat.     Assessment & Plan:  1. Congestion of right ear ear with partial impaction of wax; sending steroid ear drops & nasal spray, advised on use & SE.   - triamcinolone (NASACORT) 55 MCG/ACT AERO nasal inhaler; Place 1 spray into the nose daily. Start with 1 spray each side twice a day for 3 days, then reduce to daily.  Dispense: 1 each; Refill: 2 - acetic acid-hydrocortisone (VOSOL-HC) OTIC solution; Place 4 drops into the right ear 2 (two) times daily for 7 days. Apply to affected ear. For ear canal irritation/otitis externa.  Dispense: 10 mL; Refill: 0  2. Acute non-recurrent frontal sinusitis sending steroid nasal spray - advised on use & SE, advised on using saline nasal spray tid prn & prior to steroid nasal spray.  - triamcinolone (NASACORT) 55 MCG/ACT AERO nasal inhaler; Place 1 spray into the nose daily. Start with 1 spray each side twice a day for 3 days, then reduce to daily.  Dispense: 1 each; Refill: 2  3. Impacted cerumen of right ear-  Verbal consent received to perform right ear lavage via Hydrogen peroxide/water mix solution. Curette used to remove visible cerumen. Pt tolerated well, partial evacuation of cerumen obtained. Mild erythema with  mild bleeding noted in ear canal after procedure.    Subjective:    Outpatient Medications Prior to Visit  Medication Sig Dispense Refill   CRANBERRY PO Take 1 capsule by mouth once a week.     mesalamine (LIALDA) 1.2 g EC tablet Take 2 tablets (2.4 g total) by mouth daily with breakfast. 180 tablet 3   MICROGESTIN 1-20 MG-MCG tablet Take 1 tablet by mouth daily. 84 tablet 4   Multiple Vitamins-Minerals (MULTIVITAMIN GUMMIES WOMENS PO) Take 2 tablets by mouth daily.     omeprazole (PRILOSEC) 40 MG capsule TAKE 1 CAPSULE BY MOUTH EVERY DAY 90 capsule 1   valACYclovir (VALTREX) 1000 MG tablet TAKE 2 TABS BY MOUTH ONCE, REPEAT IN 12 HOURS WITH SYMPTOM ONSET. 90 tablet 1   No facility-administered medications prior to visit.   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   Anxiety    Atypical chest pain 04/24/2017   Dyspepsia, s/p normal EGD 03/08/17, with recommendation for lifetime anti-reflux regimen 03/26/2017   GAD (generalized anxiety disorder) 05/10/2016   History of spinal fusion for scoliosis 03/26/2017   HSV infection    Hyperlipidemia    Polyarthralgia 03/28/2017   Positive Lyme disease serology    Seasonal allergies    Tension headache 03/26/2017   Ulcerative colitis (HCC)    Vertigo    Past  Surgical History:  Procedure Laterality Date   COLONOSCOPY  2019   DILATATION & CURETTAGE/HYSTEROSCOPY WITH MYOSURE N/A 07/13/2020   Procedure: DILATATION & CURETTAGE/HYSTEROSCOPY WITH MYOSURE;  Surgeon: Jerene Bears, MD;  Location: [facility] OR;  Service: Gynecology;  Laterality: N/A;   DILATATION & CURRETTAGE/HYSTEROSCOPY WITH RESECTOCOPE  02/12/2012   Procedure: DILATATION & CURETTAGE/HYSTEROSCOPY WITH RESECTOCOPE;  Surgeon: Bennye Alm, MD;  Location: [facility] ORS;  Service: Gynecology;  Laterality: N/A;   DILATION AND CURETTAGE OF UTERUS  2010   DILATION AND CURETTAGE OF UTERUS  2012   SPINAL FUSION  2008   No Known Allergies    Objective:    Physical  Exam Vitals and nursing note reviewed.  Constitutional:      Appearance: Normal appearance. She is ill-appearing.     Interventions: Face mask in place.  HENT:     Right Ear: Tympanic membrane and ear canal normal.     Left Ear: Tympanic membrane and ear canal normal.     Nose:     Right Sinus: Frontal sinus tenderness present. No maxillary sinus tenderness.     Left Sinus: No maxillary sinus tenderness or frontal sinus tenderness.     Mouth/Throat:     Mouth: Mucous membranes are moist.     Pharynx: Posterior oropharyngeal erythema present. No pharyngeal swelling, oropharyngeal exudate or uvula swelling.     Tonsils: No tonsillar exudate or tonsillar abscesses.  Cardiovascular:     Rate and Rhythm: Normal rate and regular rhythm.  Pulmonary:     Effort: Pulmonary effort is normal.     Breath sounds: Normal breath sounds.  Musculoskeletal:        General: Normal range of motion.  Lymphadenopathy:     Head:     Right side of head: No preauricular or posterior auricular adenopathy.     Left side of head: No preauricular or posterior auricular adenopathy.     Cervical: No cervical adenopathy.  Skin:    General: Skin is warm and dry.  Neurological:     Mental Status: She is alert.  Psychiatric:        Mood and Affect: Mood normal.        Behavior: Behavior normal.    BP 110/72 (BP Location: Left Arm, Patient Position: Sitting, Cuff Size: Normal)   Pulse 81   Temp 97.7 F (36.5 C) (Temporal)   Ht 5\' 6"  (1.676 m)   Wt 154 lb 3.2 oz (69.9 kg)   SpO2 100%   BMI 24.89 kg/m  Wt Readings from Last 3 Encounters:  07/25/22 154 lb 3.2 oz (69.9 kg)  06/27/22 153 lb 2 oz (69.5 kg)  05/15/22 153 lb 9.6 oz (69.7 kg)      Dulce Sellar, NP

## 2022-08-08 ENCOUNTER — Encounter (INDEPENDENT_AMBULATORY_CARE_PROVIDER_SITE_OTHER): Payer: 59 | Admitting: Family

## 2022-08-08 DIAGNOSIS — J0110 Acute frontal sinusitis, unspecified: Secondary | ICD-10-CM

## 2022-08-08 DIAGNOSIS — B379 Candidiasis, unspecified: Secondary | ICD-10-CM

## 2022-08-09 DIAGNOSIS — J0110 Acute frontal sinusitis, unspecified: Secondary | ICD-10-CM

## 2022-08-10 MED ORDER — AMOXICILLIN-POT CLAVULANATE 875-125 MG PO TABS: 1.0000 | ORAL_TABLET | Freq: Two times a day (BID) | ORAL | 0 refills | Status: DC

## 2022-08-10 MED ORDER — FLUCONAZOLE 150 MG PO TABS: ORAL_TABLET | ORAL | 0 refills | Status: DC

## 2022-08-10 NOTE — Telephone Encounter (Signed)
Please see the MyChart message reply(ies) for my assessment and plan.  The patient gave consent for this Medical Advice Message and is aware that it may result in a bill to their insurance company as well as the possibility that this may result in a co-payment or deductible. They are an established patient, but are not seeking medical advice exclusively about a problem treated during an in person or video visit in the last 7 days. I did not recommend an in person or video visit within 7 days of my reply.  I spent a total of 11 minutes cumulative time within 7 days through MyChart messaging Hudnell, Stephanie, NP 

## 2022-08-15 NOTE — Telephone Encounter (Signed)
If she has taken the antibiotic, and is doing 2 sprays of the Nasacort twice a day and still not getting relief, she can try over the counter generic sudafed pills or Afrin decongestant nasal spray, but this is only to be used for 3 days.

## 2022-08-31 ENCOUNTER — Encounter (INDEPENDENT_AMBULATORY_CARE_PROVIDER_SITE_OTHER): Payer: Self-pay

## 2022-09-01 ENCOUNTER — Encounter: Payer: 59 | Admitting: Family Medicine

## 2022-10-02 ENCOUNTER — Telehealth: Payer: Self-pay | Admitting: Family Medicine

## 2022-10-02 NOTE — Telephone Encounter (Signed)
Called to reschedule patient's CPE but this has already been rescheduled once before from 8/16 to 9/27. Is it okay to work patient in elsewhere soon?

## 2022-10-03 NOTE — Telephone Encounter (Signed)
Please schedule patient.

## 2022-10-13 ENCOUNTER — Encounter: Payer: 59 | Admitting: Family Medicine

## 2022-10-13 ENCOUNTER — Encounter: Payer: Self-pay | Admitting: Gastroenterology

## 2022-10-20 ENCOUNTER — Encounter: Payer: Self-pay | Admitting: Family Medicine

## 2022-10-20 ENCOUNTER — Ambulatory Visit (INDEPENDENT_AMBULATORY_CARE_PROVIDER_SITE_OTHER): Payer: 59 | Admitting: Family Medicine

## 2022-10-20 VITALS — BP 104/66 | HR 76 | Temp 97.8°F | Ht 66.0 in | Wt 153.8 lb

## 2022-10-20 DIAGNOSIS — Z13220 Encounter for screening for lipoid disorders: Secondary | ICD-10-CM

## 2022-10-20 DIAGNOSIS — Z0000 Encounter for general adult medical examination without abnormal findings: Secondary | ICD-10-CM | POA: Diagnosis not present

## 2022-10-20 DIAGNOSIS — R1013 Epigastric pain: Secondary | ICD-10-CM

## 2022-10-20 DIAGNOSIS — R739 Hyperglycemia, unspecified: Secondary | ICD-10-CM | POA: Diagnosis not present

## 2022-10-20 DIAGNOSIS — R41840 Attention and concentration deficit: Secondary | ICD-10-CM | POA: Insufficient documentation

## 2022-10-20 DIAGNOSIS — K5180 Other ulcerative colitis without complications: Secondary | ICD-10-CM | POA: Diagnosis not present

## 2022-10-20 MED ORDER — OMEPRAZOLE 40 MG PO CPDR: 40.0000 mg | DELAYED_RELEASE_CAPSULE | Freq: Every day | ORAL | 3 refills | Status: DC

## 2022-10-20 NOTE — Assessment & Plan Note (Signed)
On Prilosec 40 mg daily.  Tolerating well.  Will refill today.

## 2022-10-20 NOTE — Patient Instructions (Signed)
It was very nice to see you today!  We will check blood work.  Keep working on diet and exercise.   Please let me know if you need referral to see a therapist.    Return in about 1 year (around 10/20/2023) for Annual Physical.   Take care, Dr Jimmey Ralph  PLEASE NOTE:  If you had any lab tests, please let us know if you have not heard back within a few days. You may see your results on mychart before we have a chance to review them but we will give you a call once they are reviewed by [place].   If we ordered any referrals today, please let us know if you have not heard from their office within the next week.   If you had any urgent prescriptions sent in today, please check with the pharmacy within an hour of our visit to make sure the prescription was transmitted appropriately.   Please try these tips to maintain a healthy lifestyle:  Eat at least 3 REAL meals and 1-2 snacks per day.  Aim for no more than 5 hours between eating.  If you eat breakfast, please do so within one hour of getting up.   Each meal should contain half fruits/vegetables, one quarter protein, and one quarter carbs (no bigger than a computer mouse)  Cut down on sweet beverages. This includes juice, soda, and sweet tea.   Drink at least 1 glass of water with each meal and aim for at least 8 glasses per day  Exercise at least 150 minutes every week.    Preventive Care 67-34 Years Old, Female Preventive care refers to lifestyle choices and visits with your health care provider that can promote health and wellness. Preventive care visits are also called wellness exams. What can I expect for my preventive care visit? Counseling Your health care provider may ask you questions about your: Medical history, including: Past medical problems. Family medical history. Pregnancy history. Current health, including: Menstrual cycle. Method of birth control. Emotional well-being. Home life and relationship well-being. Sexual  activity and sexual health. Lifestyle, including: Alcohol, nicotine or tobacco, and drug use. Access to firearms. Diet, exercise, and sleep habits. Work and work Astronomer. Sunscreen use. Safety issues such as seatbelt and bike helmet use. Physical exam Your health care provider will check your: Height and weight. These may be used to calculate your BMI (body mass index). BMI is a measurement that tells if you are at a healthy weight. Waist circumference. This measures the distance around your waistline. This measurement also tells if you are at a healthy weight and may help predict your risk of certain diseases, such as type 2 diabetes and high blood pressure. Heart rate and blood pressure. Body temperature. Skin for abnormal spots. What immunizations do I need?  Vaccines are usually given at various ages, according to a schedule. Your health care provider will recommend vaccines for you based on your age, medical history, and lifestyle or other factors, such as travel or where you work. What tests do I need? Screening Your health care provider may recommend screening tests for certain conditions. This may include: Lipid and cholesterol levels. Diabetes screening. This is done by checking your blood sugar (glucose) after you have not eaten for a while (fasting). Pelvic exam and Pap test. Hepatitis B test. Hepatitis C test. HIV (human immunodeficiency virus) test. STI (sexually transmitted infection) testing, if you are at risk. Lung cancer screening. Colorectal cancer screening. Mammogram. Talk with your  health care provider about when you should start having regular mammograms. This may depend on whether you have a family history of breast cancer. BRCA-related cancer screening. This may be done if you have a family history of breast, ovarian, tubal, or peritoneal cancers. Bone density scan. This is done to screen for osteoporosis. Talk with your health care provider about your  test results, treatment options, and if necessary, the need for more tests. Follow these instructions at home: Eating and drinking  Eat a diet that includes fresh fruits and vegetables, whole grains, lean protein, and low-fat dairy products. Take vitamin and mineral supplements as recommended by your health care provider. Do not drink alcohol if: Your health care provider tells you not to drink. You are pregnant, may be pregnant, or are planning to become pregnant. If you drink alcohol: Limit how much you have to 0-1 drink a day. Know how much alcohol is in your drink. In the [place] one drink equals one 12 oz bottle of beer (355 mL), one 5 oz glass of wine (148 mL), or one 1 oz glass of hard liquor (44 mL). Lifestyle Brush your teeth every morning and night with fluoride toothpaste. Floss one time each day. Exercise for at least 30 minutes 5 or more days each week. Do not use any products that contain nicotine or tobacco. These products include cigarettes, chewing tobacco, and vaping devices, such as e-cigarettes. If you need help quitting, ask your health care provider. Do not use drugs. If you are sexually active, practice safe sex. Use a condom or other form of protection to prevent STIs. If you do not wish to become pregnant, use a form of birth control. If you plan to become pregnant, see your health care provider for a prepregnancy visit. Take aspirin only as told by your health care provider. Make sure that you understand how much to take and what form to take. Work with your health care provider to find out whether it is safe and beneficial for you to take aspirin daily. Find healthy ways to manage stress, such as: Meditation, yoga, or listening to music. Journaling. Talking to a trusted person. Spending time with friends and family. Minimize exposure to UV radiation to reduce your risk of skin cancer. Safety Always wear your seat belt while driving or riding in a vehicle. Do not  drive: If you have been drinking alcohol. Do not ride with someone who has been drinking. When you are tired or distracted. While texting. If you have been using any mind-altering substances or drugs. Wear a helmet and other protective equipment during sports activities. If you have firearms in your house, make sure you follow all gun safety procedures. Seek help if you have been physically or sexually abused. What's next? Visit your health care provider once a year for an annual wellness visit. Ask your health care provider how often you should have your eyes and teeth checked. Stay up to date on all vaccines. This information is not intended to replace advice given to you by your health care provider. Make sure you discuss any questions you have with your health care provider. Document Revised: 06/30/2020 Document Reviewed: 06/30/2020 Elsevier Patient Education  2024 [employer].

## 2022-10-20 NOTE — Assessment & Plan Note (Signed)
Likely multi factorial.  She has been under a lot of stress recently which is likely the main contributor.  We discussed referral to therapy.  She will try to get this done through work however she will let us know if she needs referral.  She would like to avoid medications at this point.

## 2022-10-20 NOTE — Progress Notes (Signed)
Chief Complaint:  Alison Fleming is a 47 y.o. female who presents today for her annual comprehensive physical exam.    Assessment/Plan:  New/Acute Problems: Right Thigh Pain No red flags.  She does have slight tightness on internal/external rotation and slight hip flexor weakness on exam.  We discussed home exercises including leg raises and squats.  She will let us know if not improving and we can refer to PT or sports medicine.  Chronic Problems Addressed Today: Inattention Likely multi factorial.  She has been under a lot of stress recently which is likely the main contributor.  We discussed referral to therapy.  She will try to get this done through work however she will let us know if she needs referral.  She would like to avoid medications at this point.  Right sided ulcerative colitis (HCC) Follows with GI.  On mesalamine 2.4 g daily.  Dyspepsia, s/p normal EGD 03/08/17, with recommendation for lifetime anti-reflux regimen On Prilosec 40 mg daily.  Tolerating well.  Will refill today.  Preventative Healthcare: Check labs.  Up-to-date on colon cancer screening.  Follows with GYN for women's health.  Patient Counseling(The following topics were reviewed and/or handout was given):  -Nutrition: Stressed importance of moderation in sodium/caffeine intake, saturated fat and cholesterol, caloric balance, sufficient intake of fresh fruits, vegetables, and fiber.  -Stressed the importance of regular exercise.   -Substance Abuse: Discussed cessation/primary prevention of tobacco, alcohol, or other drug use; driving or other dangerous activities under the influence; availability of treatment for abuse.   -Injury prevention: Discussed safety belts, safety helmets, smoke detector, smoking near bedding or upholstery.   -Sexuality: Discussed sexually transmitted diseases, partner selection, use of condoms, avoidance of unintended pregnancy and contraceptive alternatives.   -Dental health:  Discussed importance of regular tooth brushing, flossing, and dental visits.  -Health maintenance and immunizations reviewed. Please refer to Health maintenance section.  Return to care in 1 year for next preventative visit.     Subjective:  HPI:  She has no acute complaints today.  See A/P for status of chronic conditions.  Over the last year or so she has noticed more difficulty with staying focused and on task.  She has been under a lot of stress recently.  Her son is currently living in [place].  She has a lot of stress surrounding this.  She does note that is hard for her to stay focused and on task at work.  She is currently managing well however is worried that the symptoms are progressing.  She is also occasionally getting right thigh pain.  This comes and goes.  No current pain.  Lifestyle Diet: Balanced. Trying to get more fruits and vegetables.  Exercise: None specific.      10/20/2022    7:38 AM  Depression screen PHQ 2/9  Decreased Interest 0  Down, Depressed, Hopeless 0  PHQ - 2 Score 0    There are no preventive care reminders to display for this patient.   ROS: Per HPI, otherwise a complete review of systems was negative.   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   Anxiety    Atypical chest pain 04/24/2017   Dyspepsia, s/p normal EGD 03/08/17, with recommendation for lifetime anti-reflux regimen 03/26/2017   GAD (generalized anxiety disorder) 05/10/2016   History of spinal fusion for scoliosis 03/26/2017   HSV infection    Hyperlipidemia  Polyarthralgia 03/28/2017   Positive Lyme disease serology    Seasonal allergies    Tension headache 03/26/2017   Ulcerative colitis [facility])    Vertigo    Patient Active Problem List   Diagnosis Date Noted   Inattention 10/20/2022   Vertigo 09/13/2021   Right sided ulcerative colitis (HCC) 07/22/2020    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   Encounter for surveillance of contraceptive pills 04/26/2020   Inclusion cyst of vulva 04/26/2020   Polyarthralgia 03/28/2017   Dyspepsia, s/p normal EGD 03/08/17, with recommendation for lifetime anti-reflux regimen 03/26/2017   Cervical pain with history of scoliosis 03/26/2017   Tension headache 03/26/2017   Past Surgical History:  Procedure Laterality Date   COLONOSCOPY  2019   DILATATION & CURETTAGE/HYSTEROSCOPY WITH MYOSURE N/A 07/13/2020   Procedure: DILATATION & CURETTAGE/HYSTEROSCOPY WITH MYOSURE;  Surgeon: Jerene Bears, MD;  Location: [facility] OR;  Service: Gynecology;  Laterality: N/A;   DILATATION & CURRETTAGE/HYSTEROSCOPY WITH RESECTOCOPE  02/12/2012   Procedure: DILATATION & CURETTAGE/HYSTEROSCOPY WITH RESECTOCOPE;  Surgeon: Bennye Alm, MD;  Location: [facility] ORS;  Service: Gynecology;  Laterality: N/A;   DILATION AND CURETTAGE OF UTERUS  2010   DILATION AND CURETTAGE OF UTERUS  2012   SPINAL FUSION  2008    Family History  Problem Relation Age of Onset   Diabetes Maternal Grandmother    Hypertension Maternal Grandmother    Heart disease Maternal Grandmother    Heart attack Maternal Grandmother    Osteoporosis Maternal Grandmother    Diabetes Maternal Grandfather    Hypertension Maternal Grandfather    Heart disease Maternal Grandfather    Heart attack Maternal Grandfather    Hypertension Mother    Anxiety disorder Mother    Diabetes Mother    Heart murmur Brother    Colon cancer Neg Hx    Esophageal cancer Neg Hx    Rectal cancer Neg Hx    Stomach cancer Neg Hx    Breast cancer Neg Hx    Colon polyps Neg Hx     Medications- reviewed and updated Current Outpatient Medications  Medication Sig Dispense Refill   CRANBERRY PO Take 1 capsule by mouth once a week.     mesalamine (LIALDA) 1.2 g EC tablet Take 2 tablets (2.4 g total) by mouth daily with breakfast. 180 tablet 3   MICROGESTIN 1-20 MG-MCG tablet Take 1 tablet by mouth  daily. 84 tablet 4   Multiple Vitamins-Minerals (MULTIVITAMIN GUMMIES WOMENS PO) Take 2 tablets by mouth daily.     valACYclovir (VALTREX) 1000 MG tablet TAKE 2 TABS BY MOUTH ONCE, REPEAT IN 12 HOURS WITH SYMPTOM ONSET. 90 tablet 1   omeprazole (PRILOSEC) 40 MG capsule Take 1 capsule (40 mg total) by mouth daily. 90 capsule 3   No current facility-administered medications for this visit.    Allergies-reviewed and updated No Known Allergies  Social History   Socioeconomic History   Marital status: Married    Spouse name: Not on file   Number of children: 2   Years of education: Not on file   Highest education level: Not on file  Occupational History   Occupation: HUMAN RESOURCES    Employer: [employer]  Tobacco Use   Smoking status: Never   Smokeless tobacco: Never  Vaping Use   Vaping status: Never Used  Substance and Sexual Activity   Alcohol use: Yes    Alcohol/week: 2.0 - 3.0 standard drinks of alcohol    Types: 2 - 3 Standard  drinks or equivalent per week   Drug use: No   Sexual activity: Yes    Partners: Male    Birth control/protection: OCP  Other Topics Concern   Not on file  Social History Narrative   Not on file   Social Determinants of Health   Financial Resource Strain: Not on file  Food Insecurity: Not on file  Transportation Needs: Not on file  Physical Activity: Not on file  Stress: Not on file  Social Connections: Not on file        Objective:  Physical Exam: BP 104/66   Pulse 76   Temp 97.8 F (36.6 C) (Temporal)   Ht 5\' 6"  (1.676 m)   Wt 153 lb 12.8 oz (69.8 kg)   SpO2 96%   BMI 24.82 kg/m   Body mass index is 24.82 kg/m. Wt Readings from Last 3 Encounters:  10/20/22 153 lb 12.8 oz (69.8 kg)  07/25/22 154 lb 3.2 oz (69.9 kg)  06/27/22 153 lb 2 oz (69.5 kg)   Gen: NAD, resting comfortably HEENT: TMs normal bilaterally. OP clear. No thyromegaly noted.  CV: RRR with no murmurs appreciated Pulm: NWOB, CTAB with no  crackles, wheezes, or rhonchi GI: Normal bowel sounds present. Soft, Nontender, Nondistended. MSK: no edema, cyanosis, or clubbing noted - Right Hip: Slight decreased external rotation.  Neurovascularly intact distally.  Slight weakness with hip flexion.  Skin: warm, dry Neuro: CN2-12 grossly intact. Strength 5/5 in upper and lower extremities. Reflexes symmetric and intact bilaterally.  Psych: Normal affect and thought content     Amany Rando M. Jimmey Ralph, MD 10/20/2022 8:06 AM

## 2022-10-20 NOTE — Assessment & Plan Note (Signed)
Follows with GI.  On mesalamine 2.4 g daily.

## 2022-10-26 ENCOUNTER — Encounter: Payer: 59 | Admitting: Family Medicine

## 2022-12-01 ENCOUNTER — Encounter: Payer: Self-pay | Admitting: Family Medicine

## 2022-12-01 ENCOUNTER — Other Ambulatory Visit: Payer: Self-pay | Admitting: *Deleted

## 2022-12-01 DIAGNOSIS — R739 Hyperglycemia, unspecified: Secondary | ICD-10-CM

## 2022-12-01 DIAGNOSIS — Z13220 Encounter for screening for lipoid disorders: Secondary | ICD-10-CM

## 2022-12-01 DIAGNOSIS — Z0000 Encounter for general adult medical examination without abnormal findings: Secondary | ICD-10-CM

## 2022-12-08 ENCOUNTER — Other Ambulatory Visit (INDEPENDENT_AMBULATORY_CARE_PROVIDER_SITE_OTHER): Payer: 59

## 2022-12-08 DIAGNOSIS — Z0000 Encounter for general adult medical examination without abnormal findings: Secondary | ICD-10-CM | POA: Diagnosis not present

## 2022-12-08 DIAGNOSIS — R739 Hyperglycemia, unspecified: Secondary | ICD-10-CM | POA: Diagnosis not present

## 2022-12-08 DIAGNOSIS — Z13220 Encounter for screening for lipoid disorders: Secondary | ICD-10-CM | POA: Diagnosis not present

## 2022-12-08 LAB — LIPID PANEL
Cholesterol: 201 mg/dL — ABNORMAL HIGH (ref 0–200)
HDL: 50.1 mg/dL (ref >39.00)
LDL Cholesterol: 123 mg/dL — ABNORMAL HIGH (ref 0–99)
NonHDL: 150.91
Total CHOL/HDL Ratio: 4
Triglycerides: 141 mg/dL (ref 0.0–149.0)
VLDL: 28.2 mg/dL (ref 0.0–40.0)

## 2022-12-08 LAB — COMPREHENSIVE METABOLIC PANEL
ALT: 17 U/L (ref 0–35)
AST: 17 U/L (ref 0–37)
Albumin: 4.2 g/dL (ref 3.5–5.2)
Alkaline Phosphatase: 50 U/L (ref 39–117)
BUN: 13 mg/dL (ref 6–23)
CO2: 26 meq/L (ref 19–32)
Calcium: 9.3 mg/dL (ref 8.4–10.5)
Chloride: 102 meq/L (ref 96–112)
Creatinine, Ser: 0.85 mg/dL (ref 0.40–1.20)
GFR: 82.42 mL/min (ref >60.00)
Glucose, Bld: 79 mg/dL (ref 70–99)
Potassium: 3.9 meq/L (ref 3.5–5.1)
Sodium: 137 meq/L (ref 135–145)
Total Bilirubin: 0.6 mg/dL (ref 0.2–1.2)
Total Protein: 7.5 g/dL (ref 6.0–8.3)

## 2022-12-08 LAB — CBC
HCT: 38.6 % (ref 36.0–46.0)
Hemoglobin: 13.1 g/dL (ref 12.0–15.0)
MCHC: 33.9 g/dL (ref 30.0–36.0)
MCV: 90.2 fL (ref 78.0–100.0)
Platelets: 376 10*3/uL (ref 150.0–400.0)
RBC: 4.28 Mil/uL (ref 3.87–5.11)
RDW: 12.7 % (ref 11.5–15.5)
WBC: 7 10*3/uL (ref 4.0–10.5)

## 2022-12-08 LAB — TSH: TSH: 1.43 u[IU]/mL (ref 0.35–5.50)

## 2022-12-08 LAB — HEMOGLOBIN A1C: Hgb A1c MFr Bld: 5.6 % (ref 4.6–6.5)

## 2022-12-11 NOTE — Progress Notes (Signed)
Cholesterol is up a little bit since last time.  The rest of her labs are stable.  She should continue to work on diet and exercise and we can recheck everything in a year or so.

## 2023-01-30 ENCOUNTER — Other Ambulatory Visit: Payer: Self-pay | Admitting: Obstetrics & Gynecology

## 2023-01-30 DIAGNOSIS — Z1231 Encounter for screening mammogram for malignant neoplasm of breast: Secondary | ICD-10-CM

## 2023-02-06 ENCOUNTER — Telehealth: Payer: Self-pay | Admitting: Gastroenterology

## 2023-02-06 NOTE — Telephone Encounter (Signed)
Inbound call from patient stating that she had a change in bowel habits. Patient stated that she has seen blood in her stool and has also had abdominal pain. Patient is concerned and is requesting a call to discuss. Please advise.

## 2023-02-07 NOTE — Telephone Encounter (Signed)
Inbound call from patient requesting a follow up call regarding previous note. Please advise, thank you.

## 2023-02-07 NOTE — Telephone Encounter (Signed)
Patient began having abdominal discomfort about a week or a week and a half ago. The pain became bad enough that she took ibuprofen and stayed on the couch. She had a good bowel movement which may her more comfortable. Now she is seeing blood when she cleans and has a sensation of "something being in there and I am scared."  She really wants to be seen. Patient's voice sounds tremulous as she expresses her anxiety. Appointment scheduled for evaluation.

## 2023-02-08 NOTE — Telephone Encounter (Signed)
Agree with follow-up office visit to evaluate, possible etiology anal fissure or small-volume bleeding from hemorrhoids.

## 2023-02-22 ENCOUNTER — Other Ambulatory Visit: Payer: 59

## 2023-02-22 ENCOUNTER — Encounter: Payer: Self-pay | Admitting: Gastroenterology

## 2023-02-22 ENCOUNTER — Ambulatory Visit (INDEPENDENT_AMBULATORY_CARE_PROVIDER_SITE_OTHER): Payer: 59 | Admitting: Gastroenterology

## 2023-02-22 VITALS — BP 110/72 | HR 65 | Ht 66.0 in | Wt 153.0 lb

## 2023-02-22 DIAGNOSIS — K51018 Ulcerative (chronic) pancolitis with other complication: Secondary | ICD-10-CM

## 2023-02-22 DIAGNOSIS — K649 Unspecified hemorrhoids: Secondary | ICD-10-CM | POA: Diagnosis not present

## 2023-02-22 DIAGNOSIS — K51918 Ulcerative colitis, unspecified with other complication: Secondary | ICD-10-CM | POA: Diagnosis not present

## 2023-02-22 DIAGNOSIS — K5900 Constipation, unspecified: Secondary | ICD-10-CM | POA: Diagnosis not present

## 2023-02-22 DIAGNOSIS — K582 Mixed irritable bowel syndrome: Secondary | ICD-10-CM

## 2023-02-22 LAB — HIGH SENSITIVITY CRP: CRP, High Sensitivity: 2.1 mg/L (ref 0.000–5.000)

## 2023-02-22 MED ORDER — SUFLAVE 178.7 G PO SOLR: 1.0000 | Freq: Once | ORAL | 0 refills | Status: AC

## 2023-02-22 MED ORDER — ONDANSETRON HCL 4 MG PO TABS: ORAL_TABLET | ORAL | 0 refills | Status: DC

## 2023-02-22 NOTE — Patient Instructions (Addendum)
 VISIT SUMMARY:  Today, we discussed your recent changes in bowel habits and abdominal pain, which have been occurring since you increased your mesalamine  dose. We reviewed your symptoms, including irregular bowel movements, severe evening abdominal pain, and constipation. We also addressed your concerns about hemorrhoids and the sensation of something in your rectum. A plan was made to manage your symptoms and further evaluate your condition.  YOUR PLAN:  -ULCERATIVE COLITIS: Ulcerative colitis is a chronic condition that causes inflammation in the colon. Your symptoms have fluctuated since increasing your mesalamine  dose. We will continue with one pill of mesalamine  daily until your colonoscopy on April 10th. We will also add Benefiber powder with every meal and MiraLAX at bedtime to help with bowel movements. Blood work and a stool test will be done today to assess inflammation, and Zofran   will be provided for nausea during the colonoscopy prep.  -CONSTIPATION: Constipation is when you have infrequent or difficult bowel movements. This has been causing you severe gas pains. To help with this, we are adding Benefiber powder with every meal and MiraLAX 1/2 capful at bedtime. Adjust the MiraLAX dose as needed to find relief.  -HEMORRHOIDS: Hemorrhoids are swollen veins in the lower rectum, often caused by straining during bowel movements. You have reported feeling something in your rectal area, which may be related to hemorrhoids. A rectal exam confirmed their presence. We will monitor this and address it as needed.  INSTRUCTIONS:  Please remember to continue taking one pill of mesalamine  daily until your colonoscopy on April 10th at 10:30 AM. Add Benefiber powder to each meal and start with half a capful of MiraLAX at bedtime, adjusting as needed. Complete the blood work and stool test today. Follow up with gastroenterology based on your test results and symptom progression.  Due to recent changes  in healthcare laws, you may see the results of your imaging and laboratory studies on MyChart before your provider has had a chance to review them.  We understand that in some cases there may be results that are confusing or concerning to you. Not all laboratory results come back in the same time frame and the provider may be waiting for multiple results in order to interpret others.  Please give us  48 hours in order for your provider to thoroughly review all the results before contacting the office for clarification of your results.    You have been scheduled for a colonoscopy. Please follow written instructions given to you at your visit today.   If you use inhalers (even only as needed), please bring them with you on the day of your procedure.  DO NOT TAKE 7 DAYS PRIOR TO TEST- Trulicity (dulaglutide) Ozempic, Wegovy (semaglutide) Mounjaro (tirzepatide) Bydureon Bcise (exanatide extended release)  DO NOT TAKE 1 DAY PRIOR TO YOUR TEST Rybelsus (semaglutide) Adlyxin (lixisenatide) Victoza (liraglutide) Byetta (exanatide) ___________________________________________________________________________  [name] will receive your bowel preparation through [facility], which ensures the lowest copay and home delivery, with outreach via text or call from an [phone] number. Please respond promptly to avoid rescheduling of your procedure. If you are interested in alternative options or have any questions regarding your prep, please contact them at [phone] ____________________________________________________________________________  Your Provider Has Sent Your Bowel Prep Regimen To [facility]   [facility] will contact you to verify your information and collect your copay, if applicable. Enjoy the comfort of your home while your prescription is mailed to you, FREE of any shipping charges.   [facility] accepts all major insurance benefits and  applies discounts & coupons.  Have additional questions?    Chat: [email or web address] Call: [phone] Email: [email or web address] NCPDP: [id number]  How will [facility] contact you?  With a Welcome phone call,  a Welcome text and a checkout link in text form.  Texts you receive from [phone] Are NOT Spam.  *To set up delivery, you must complete the checkout process via link or speak to one of the patient care representatives. If [facility] is unable to reach you, your prescription may be delayed.  To avoid long hold times on the phone, you may also utilize the secure chat feature on the [facility] website to request that they call you back for transaction completion or to expedite your concerns.   I appreciate the  opportunity to care for you  Thank You   Kavitha Nandigam , MD

## 2023-02-22 NOTE — Progress Notes (Signed)
 Alison Fleming    [id number]    12/01/1975  Primary Care Physician:Parker, Worth HERO, MD  Referring Physician: Kennyth Worth HERO, MD [address] [place],  [place] [address]   Chief complaint: Constipation, abdominal pain  Discussed the use of AI scribe software for clinical note transcription with the patient, who gave verbal consent to proceed.  History of Present Illness   Alison Fleming is a 48 year old female with ulcerative colitis who presents with changes in bowel habits and abdominal pain.  Since increasing her mesalamine  dose from one to two pills around Christmas, she has experienced changes in bowel habits. Bowel movements have become irregular, with varying consistency, occurring approximately three to four times a day on some days. Stools are sometimes thin, though not consistently pencil-thin, and there is no mucus present.  Severe abdominal pain occurs in the evenings, initially not associated with gas or constipation. The pain is intense, described as 'crippling on the couch', and later identified as related to gas and constipation. She had similar constipation issues during pregnancy, which led to hemorrhoids. Recently, there is a sensation of something present in the rectum, possibly related to straining. No blood in stool has been observed, except for one instance of black stool following constipation, attributed to hemorrhoids. There is concern about the sensation of something in the rectum, occasionally felt.  Her current medication regimen includes mesalamine , increased to two pills around Christmas. She reports doing better on a lower dose of one pill, as higher doses seem to worsen symptoms.  She has a son who plays soccer for the [employer], requiring frequent travel between her home and [place], impacting her schedule and availability for medical appointments. No skin rash, joint pains, or current stomach pains. She has engaged in exercises  since last week, causing some soreness.       GI Hx:   Colonoscopy May 17, 2020 for rectal bleeding and change in bowel habits - One 5 mm polyp in the cecum, removed with a cold snare. Resected and retrieved. - Erythematous mucosa in the rectum, in the sigmoid colon, in the descending colon, in the transverse colon, in the ascending colon and in the cecum. Biopsied. - Scar in the rectum. Biopsied. - Non-bleeding internal hemorrhoid   1. Surgical [P], colon, cecum, polyp (1) - SESSILE SERRATED POLYP WITHOUT CYTOLOGIC DYSPLASIA (ONE). 2. Surgical [P], right colon -mild, patchy erythema - FOCAL ACTIVE COLITIS WITH MILD CHRONIC CHANGES. - NO GRANULOMAS OR DYSPLASIA. - SEE MICROSCOPIC DESCRIPTION. 3. Surgical [P], left colon-erythema, proctis - FOCAL ACTIVE COLITIS WITH MINIMAL CHRONIC CHANGES. - NO GRANULOMAS OR DYSPLASIA. - SEE MICROSCOPIC DESCRIPTION.   Colonoscopy November 28, 2017 for change in bowel habits - A single erosion in the terminal ileum. Biopsied. - Patchy mild inflammation was found in the rectum secondary to proctitis. Biopsied. - The entire examined colon is normal. Biopsied. - Non-bleeding internal hemorrhoids.   1. Surgical [P], terminal ileum - ILEAL MUCOSA WITH NO SPECIFIC HISTOPATHOLOGIC CHANGES. SEE NOTE. 2. Surgical [P], right colon - COLONIC MUCOSA WITH NO SPECIFIC HISTOPATHOLOGIC CHANGES. SEE NOTE. 3. Surgical [P], left colon BX - COLONIC MUCOSA WITH NO SPECIFIC HISTOPATHOLOGIC CHANGES. SEE NOTE. 4. Surgical [P], rectum - RECTAL MUCOSA WITH NO SPECIFIC HISTOPATHOLOGIC CHANGES. SEE NOTE.   1. -4. The ileocolonic biopsies are negative for acute inflammation, features of chronicity or granulomas.   EGD March 08, 2017: Normal   Abdominal ultrasound January 03, 2017: Normal   Outpatient Encounter Medications as of 02/22/2023  Medication Sig   CRANBERRY PO Take 1 capsule by mouth once a week.   mesalamine  (LIALDA ) 1.2 g EC tablet Take 2 tablets (2.4 g  total) by mouth daily with breakfast.   MICROGESTIN  1-20 MG-MCG tablet Take 1 tablet by mouth daily.   Multiple Vitamins-Minerals (MULTIVITAMIN GUMMIES WOMENS PO) Take 2 tablets by mouth daily.   omeprazole  (PRILOSEC) 40 MG capsule Take 1 capsule (40 mg total) by mouth daily.   ondansetron  (ZOFRAN ) 4 MG tablet Use as directed with colonoscopy prep   [EXPIRED] SUFLAVE  178.7 g SOLR Take 1 kit by mouth once for 1 dose.   valACYclovir  (VALTREX ) 1000 MG tablet TAKE 2 TABS BY MOUTH ONCE, REPEAT IN 12 HOURS WITH SYMPTOM ONSET.   No facility-administered encounter medications on file as of 02/22/2023.    Allergies as of 02/22/2023   (No Known Allergies)    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   Anxiety    Atypical chest pain 04/24/2017   Dyspepsia, s/p normal EGD 03/08/17, with recommendation for lifetime anti-reflux regimen 03/26/2017   GAD (generalized anxiety disorder) 05/10/2016   History of spinal fusion for scoliosis 03/26/2017   HSV infection    Hyperlipidemia    Polyarthralgia 03/28/2017   Positive Lyme disease serology    Seasonal allergies    Tension headache 03/26/2017   Ulcerative colitis (HCC)    Vertigo     Past Surgical History:  Procedure Laterality Date   COLONOSCOPY  2019   DILATATION & CURETTAGE/HYSTEROSCOPY WITH MYOSURE N/A 07/13/2020   Procedure: DILATATION & CURETTAGE/HYSTEROSCOPY WITH MYOSURE;  Surgeon: Cleotilde Ronal RAMAN, MD;  Location: [facility] OR;  Service: Gynecology;  Laterality: N/A;   DILATATION & CURRETTAGE/HYSTEROSCOPY WITH RESECTOCOPE  02/12/2012   Procedure: DILATATION & CURETTAGE/HYSTEROSCOPY WITH RESECTOCOPE;  Surgeon: Randine VEAR Fender, MD;  Location: [facility] ORS;  Service: Gynecology;  Laterality: N/A;   DILATION AND CURETTAGE OF UTERUS  2010   DILATION AND CURETTAGE OF UTERUS  2012   SPINAL FUSION  2008    Family History  Problem Relation Age of Onset   Diabetes Maternal Grandmother    Hypertension Maternal Grandmother     Heart disease Maternal Grandmother    Heart attack Maternal Grandmother    Osteoporosis Maternal Grandmother    Diabetes Maternal Grandfather    Hypertension Maternal Grandfather    Heart disease Maternal Grandfather    Heart attack Maternal Grandfather    Hypertension Mother    Anxiety disorder Mother    Diabetes Mother    Heart murmur Brother    Colon cancer Neg Hx    Esophageal cancer Neg Hx    Rectal cancer Neg Hx    Stomach cancer Neg Hx    Breast cancer Neg Hx    Colon polyps Neg Hx     Social History   Socioeconomic History   Marital status: Married    Spouse name: Not on file   Number of children: 2   Years of education: Not on file   Highest education level: Not on file  Occupational History   Occupation: HUMAN RESOURCES    Employer: [employer]  Tobacco Use   Smoking status: Never   Smokeless tobacco: Never  Vaping Use   Vaping status: Never Used  Substance and Sexual Activity   Alcohol use: Yes    Alcohol/week: 2.0 - 3.0 standard drinks of alcohol    Types: 2 -  3 Standard drinks or equivalent per week   Drug use: No   Sexual activity: Yes    Partners: Male    Birth control/protection: OCP  Other Topics Concern   Not on file  Social History Narrative   Not on file   Social Drivers of Health   Financial Resource Strain: Not on file  Food Insecurity: Not on file  Transportation Needs: Not on file  Physical Activity: Not on file  Stress: Not on file  Social Connections: Not on file  Intimate Partner Violence: Not on file      Review of systems: All other review of systems negative except as mentioned in the HPI.   Physical Exam: Vitals:   02/22/23 1115  BP: 110/72  Pulse: 65   Body mass index is 24.69 kg/m. Gen:      No acute distress HEENT:  sclera anicteric CV: s1s2 rrr, no murmur Lungs: B/l clear. Abd:      soft, non-tender; no palpable masses, no distension Ext:    No edema Neuro: alert and oriented x 3 Psych:  normal mood and affect  Data Reviewed:  Reviewed labs, radiology imaging, old records and pertinent past GI work up     Assessment and Plan    Ulcerative Colitis Ulcerative colitis with fluctuating symptoms. Reports increased bowel movement frequency and inconsistency since increasing mesalamine  dose to two pills around Christmas. Symptoms include occasional thin stools, severe gas pains, and constipation. No blood or mucus in stool noted recently. Differential includes mesalamine -induced symptoms versus active colitis. Discussed risks of stopping mesalamine , including potential flare-up requiring steroids. Colonoscopy recommended to evaluate disease activity and determine if mesalamine  is contributing to symptoms. Explained that persistent inflammation can lead to complications such as cancer. Discussed alternative treatments if mesalamine  is the cause. - Order CRP and fecal calprotectin to assess inflammation - Schedule colonoscopy for April 10th at 10:30 AM - Continue mesalamine  at one pill daily until colonoscopy - Add Benefiber powder, one tablespoon with every meal - Add MiraLAX, start with half a capful at bedtime and adjust as needed - Provide Zofran  ODT for nausea during colonoscopy prep  Constipation Intermittent constipation with severe gas pains. Symptoms include infrequent bowel movements and difficulty evacuating stool. Likely exacerbated by ulcerative colitis and medication adjustments. - Add Benefiber powder, one tablespoon with every meal - Add MiraLAX, start with half a capful at bedtime and adjust as needed  Hemorrhoids Presence of hemorrhoids, likely exacerbated by straining during constipation. Reports feeling something in the rectal area, which may be related to hemorrhoids. Rectal exam confirmed presence of hemorrhoids without nodules. - Perform rectal exam to assess hemorrhoids  Follow-up - Schedule colonoscopy for April 10th at 10:30 AM - Perform blood work and  stool test today - Adjust mesalamine  dose based on inflammation markers and colonoscopy results - Follow up with gastroenterology as needed based on test results and symptom progression.       This visit required 40 minutes of patient care (this includes precharting, chart review, review of results, face-to-face time used for counseling as well as treatment plan and follow-up. The patient was provided an opportunity to ask questions and all were answered. The patient agreed with the plan and demonstrated an understanding of the instructions.  Alison Fleming , MD    CC: Kennyth Worth HERO, MD

## 2023-02-23 ENCOUNTER — Encounter: Payer: Self-pay | Admitting: Gastroenterology

## 2023-03-22 ENCOUNTER — Ambulatory Visit
Admission: RE | Admit: 2023-03-22 | Discharge: 2023-03-22 | Disposition: A | Discharge status: Home / Self Care | Payer: 59 | Source: Ambulatory Visit | Attending: Obstetrics & Gynecology | Admitting: Obstetrics & Gynecology

## 2023-03-22 DIAGNOSIS — Z1231 Encounter for screening mammogram for malignant neoplasm of breast: Secondary | ICD-10-CM

## 2023-04-24 ENCOUNTER — Encounter: Payer: Self-pay | Admitting: Certified Registered Nurse Anesthetist

## 2023-04-25 ENCOUNTER — Telehealth: Payer: Self-pay | Admitting: Gastroenterology

## 2023-04-25 NOTE — Telephone Encounter (Signed)
 [name],   Patient requested to cancel tomorrow's colonoscopy 4/10 due to a family emergency and having to fly to [place]. Patient will call back at a better time to reschedule.   Thank you.

## 2023-04-26 ENCOUNTER — Encounter: Payer: 59 | Admitting: Gastroenterology

## 2023-04-26 NOTE — Telephone Encounter (Signed)
 ok

## 2023-05-21 ENCOUNTER — Ambulatory Visit (HOSPITAL_BASED_OUTPATIENT_CLINIC_OR_DEPARTMENT_OTHER): Payer: 59 | Admitting: Obstetrics & Gynecology

## 2023-05-24 ENCOUNTER — Ambulatory Visit: Admitting: Family Medicine

## 2023-05-29 ENCOUNTER — Other Ambulatory Visit (HOSPITAL_COMMUNITY)
Admission: RE | Admit: 2023-05-29 | Discharge: 2023-05-29 | Disposition: A | Discharge status: Home / Self Care | Source: Ambulatory Visit | Attending: Obstetrics & Gynecology | Admitting: Obstetrics & Gynecology

## 2023-05-29 ENCOUNTER — Ambulatory Visit (INDEPENDENT_AMBULATORY_CARE_PROVIDER_SITE_OTHER): Admitting: Obstetrics & Gynecology

## 2023-05-29 ENCOUNTER — Ambulatory Visit (HOSPITAL_BASED_OUTPATIENT_CLINIC_OR_DEPARTMENT_OTHER): Admitting: Certified Nurse Midwife

## 2023-05-29 ENCOUNTER — Encounter (HOSPITAL_BASED_OUTPATIENT_CLINIC_OR_DEPARTMENT_OTHER): Payer: Self-pay | Admitting: Obstetrics & Gynecology

## 2023-05-29 VITALS — BP 94/60 | HR 80 | Ht 66.0 in | Wt 156.8 lb

## 2023-05-29 DIAGNOSIS — N9089 Other specified noninflammatory disorders of vulva and perineum: Secondary | ICD-10-CM

## 2023-05-29 DIAGNOSIS — R4586 Emotional lability: Secondary | ICD-10-CM

## 2023-05-29 DIAGNOSIS — G44209 Tension-type headache, unspecified, not intractable: Secondary | ICD-10-CM

## 2023-05-29 DIAGNOSIS — Z3041 Encounter for surveillance of contraceptive pills: Secondary | ICD-10-CM

## 2023-05-29 DIAGNOSIS — Z124 Encounter for screening for malignant neoplasm of cervix: Secondary | ICD-10-CM | POA: Diagnosis present

## 2023-05-29 DIAGNOSIS — Z01419 Encounter for gynecological examination (general) (routine) without abnormal findings: Secondary | ICD-10-CM

## 2023-05-29 MED ORDER — CYCLOBENZAPRINE HCL 10 MG PO TABS: 10.0000 mg | ORAL_TABLET | Freq: Every evening | ORAL | 0 refills | Status: DC | PRN

## 2023-05-29 MED ORDER — TRIAMCINOLONE ACETONIDE 0.5 % EX OINT: 1.0000 | TOPICAL_OINTMENT | Freq: Two times a day (BID) | CUTANEOUS | 1 refills | Status: DC

## 2023-05-29 MED ORDER — MICROGESTIN 1/20 1-20 MG-MCG PO TABS: 1.0000 | ORAL_TABLET | Freq: Every day | ORAL | 4 refills | Status: AC

## 2023-05-29 NOTE — Progress Notes (Unsigned)
 ANNUAL EXAM Patient name: Alison Fleming MRN [id number]  Date of birth: 1975-03-12 Chief Complaint:   Annual Exam  History of Present Illness:   Alison Fleming is a 48 y.o. G15P2012 Caucasian female being seen today for a routine annual exam.  On OCPs.  Does cycle with her OCP.  Flow lasts two days.  Does have some bleeding mid cycle if has more strenuous bowel movement.  Has undergone evaluation including ultrasound and hysteroscopy.  Pap will be updated this year.  Pt desires yearly pap smear.  Having some symptoms that feel to her like possible hormonal changes.  She is on continuous OCPs so that makes it a little more difficult to know exactly when enters menopause.  Discussed some lab work when she is on placebo week.  Does have mood changes and some vulvar irritation as well.  Has been having some headaches.  Has used flexeril  in the past.  Would like RF.  Uses rarely.   Patient's last menstrual period was 05/08/2023 (approximate).     Last pap  05/10/2022 . Results were: NILM w/ HRHPV negative. H/O abnormal pap: no Last mammogram: 03/22/2023. Results were: normal. Family h/o breast cancer: no Last colonoscopy: 05/17/2020. Polyps noted, follow up 5 years.  Having more colitis symptoms so having done next week.  Family h/o colorectal cancer: no     10/20/2022    7:38 AM 05/15/2022    8:20 AM 03/09/2022    4:07 PM 09/13/2021   10:30 AM 08/30/2021    8:32 AM  Depression screen PHQ 2/9  Decreased Interest 0 0 0 0 0  Down, Depressed, Hopeless 0 0 0 0 0  PHQ - 2 Score 0 0 0 0 0        12/25/2017    8:13 AM  GAD 7 : Generalized Anxiety Score  Nervous, Anxious, on Edge 2  Control/stop worrying 1  Worry too much - different things 1  Trouble relaxing 0  Restless 0  Easily annoyed or irritable 1  Afraid - awful might happen 1  Total GAD 7 Score 6  Anxiety Difficulty Somewhat difficult     Review of Systems:   Pertinent items are noted in HPI  Denies bladder  changes Pertinent History Reviewed:  Reviewed past medical,surgical, social and family history.  Reviewed problem list, medications and allergies. Physical Assessment:   Vitals:   05/29/23 1057  BP: 94/60  Pulse: 80  Weight: 156 lb 12.8 oz (71.1 kg)  Height: 5\' 6"  (1.676 m)  Body mass index is 25.31 kg/m.        Physical Examination:   General appearance - well appearing, and in no distress  Mental status - alert, oriented to person, place, and time  Psych:  She has a normal mood and affect  Skin - warm and dry, normal color, no suspicious lesions noted  Chest - effort normal, all lung fields clear to auscultation bilaterally  Heart - normal rate and regular rhythm  Neck:  midline trachea, no thyromegaly or nodules  Breasts - breasts appear normal, no suspicious masses, no skin or nipple changes or axillary nodes  Abdomen - soft, nontender, nondistended, no masses or organomegaly  Pelvic - VULVA: normal appearing vulva with no masses, tenderness or lesions   VAGINA: normal appearing vagina with normal color and discharge, no lesions   CERVIX: normal appearing cervix without discharge or lesions, no CMT  Thin prep pap is updated today per pt request  UTERUS: uterus  is felt to be normal size, shape, consistency and nontender   ADNEXA: No adnexal masses or tenderness noted.  Rectal - normal rectal, good sphincter tone, no masses felt.   Extremities:  No swelling or varicosities noted  Chaperone present for exam  Assessment & Plan:   1. Well woman exam with routine gynecological exam (Primary) - Pap smear updated today per her request - Mammogram 03/22/2023 - Colonoscopy 2022, follow up 5 years - lab work ordered as per below - vaccines reviewed/updated  2. Encounter for surveillance of contraceptive pills - MICROGESTIN  1-20 MG-MCG tablet; Take 1 tablet by mouth daily.  Dispense: 84 tablet; Refill: 4  3. Cervical cancer screening - Cytology - PAP( [place])  4. Mood  changes - Cortisol; Future - Estradiol ; Future - Follicle stimulating hormone; Future  5. Tension headache - cyclobenzaprine  (FLEXERIL ) 10 MG tablet; Take 1 tablet (10 mg total) by mouth at bedtime as needed for muscle spasms.  Dispense: 30 tablet; Refill: 0  6. Vulvar irritation - exam is normal.  Will try topical steroid ointment for 1 week. Consider bx if no improvement.   - triamcinolone  ointment (KENALOG ) 0.5 %; Apply 1 Application topically 2 (two) times daily.  Dispense: 30 g; Refill: 1   Follow-up: Return in about 1 year (around 05/28/2024).  Lillian Rein, MD 06/01/2023 5:10 PM

## 2023-06-01 ENCOUNTER — Encounter (HOSPITAL_BASED_OUTPATIENT_CLINIC_OR_DEPARTMENT_OTHER): Payer: Self-pay | Admitting: Obstetrics & Gynecology

## 2023-06-01 ENCOUNTER — Ambulatory Visit (HOSPITAL_BASED_OUTPATIENT_CLINIC_OR_DEPARTMENT_OTHER): Payer: Self-pay | Admitting: Obstetrics & Gynecology

## 2023-06-01 LAB — CYTOLOGY - PAP
Comment: NEGATIVE
Diagnosis: UNDETERMINED — AB
High risk HPV: NEGATIVE

## 2023-06-06 ENCOUNTER — Ambulatory Visit (AMBULATORY_SURGERY_CENTER): Admitting: Gastroenterology

## 2023-06-06 ENCOUNTER — Other Ambulatory Visit: Payer: Self-pay | Admitting: Obstetrics & Gynecology

## 2023-06-06 ENCOUNTER — Other Ambulatory Visit

## 2023-06-06 ENCOUNTER — Other Ambulatory Visit (HOSPITAL_BASED_OUTPATIENT_CLINIC_OR_DEPARTMENT_OTHER): Payer: Self-pay

## 2023-06-06 ENCOUNTER — Other Ambulatory Visit (HOSPITAL_BASED_OUTPATIENT_CLINIC_OR_DEPARTMENT_OTHER)

## 2023-06-06 ENCOUNTER — Encounter: Payer: Self-pay | Admitting: Gastroenterology

## 2023-06-06 VITALS — BP 108/65 | HR 70 | Temp 98.1°F | Resp 12 | Ht 66.0 in | Wt 153.0 lb

## 2023-06-06 DIAGNOSIS — K644 Residual hemorrhoidal skin tags: Secondary | ICD-10-CM | POA: Diagnosis not present

## 2023-06-06 DIAGNOSIS — Z860101 Personal history of adenomatous and serrated colon polyps: Secondary | ICD-10-CM

## 2023-06-06 DIAGNOSIS — D125 Benign neoplasm of sigmoid colon: Secondary | ICD-10-CM

## 2023-06-06 DIAGNOSIS — K51018 Ulcerative (chronic) pancolitis with other complication: Secondary | ICD-10-CM

## 2023-06-06 DIAGNOSIS — K5190 Ulcerative colitis, unspecified, without complications: Secondary | ICD-10-CM | POA: Diagnosis not present

## 2023-06-06 DIAGNOSIS — K648 Other hemorrhoids: Secondary | ICD-10-CM | POA: Diagnosis not present

## 2023-06-06 DIAGNOSIS — K635 Polyp of colon: Secondary | ICD-10-CM

## 2023-06-06 DIAGNOSIS — Z860100 Personal history of colon polyps, unspecified: Secondary | ICD-10-CM

## 2023-06-06 DIAGNOSIS — K582 Mixed irritable bowel syndrome: Secondary | ICD-10-CM

## 2023-06-06 DIAGNOSIS — Z1211 Encounter for screening for malignant neoplasm of colon: Secondary | ICD-10-CM | POA: Diagnosis present

## 2023-06-06 DIAGNOSIS — R4586 Emotional lability: Secondary | ICD-10-CM

## 2023-06-06 MED ORDER — SODIUM CHLORIDE 0.9 % IV SOLN: 500.0000 mL | INTRAVENOUS | Status: DC

## 2023-06-06 NOTE — Progress Notes (Signed)
 Called to room to assist during endoscopic procedure.  Patient ID and intended procedure confirmed with present staff. Received instructions for my participation in the procedure from the performing physician.

## 2023-06-06 NOTE — Patient Instructions (Signed)
 Handouts provided on polyps and hemorrhoids.  Resume previous diet.  Continue present medications. Await pathology results.  Repeat colonoscopy in 5 years for surveillance based on pathology results.  Return to GI office in 1 year for follow up for UC or sooner if needed.   YOU HAD AN ENDOSCOPIC PROCEDURE TODAY AT THE [facility]:   Refer to the procedure report that was given to you for any specific questions about what was found during the examination.  If the procedure report does not answer your questions, please call your gastroenterologist to clarify.  If you requested that your care partner not be given the details of your procedure findings, then the procedure report has been included in a sealed envelope for you to review at your convenience later.  YOU SHOULD EXPECT: Some feelings of bloating in the abdomen. Passage of more gas than usual.  Walking can help get rid of the air that was put into your GI tract during the procedure and reduce the bloating. If you had a lower endoscopy (such as a colonoscopy or flexible sigmoidoscopy) you may notice spotting of blood in your stool or on the toilet paper. If you underwent a bowel prep for your procedure, you may not have a normal bowel movement for a few days.  Please Note:  You might notice some irritation and congestion in your nose or some drainage.  This is from the oxygen used during your procedure.  There is no need for concern and it should clear up in a day or so.  SYMPTOMS TO REPORT IMMEDIATELY:  Following lower endoscopy (colonoscopy or flexible sigmoidoscopy):  Excessive amounts of blood in the stool  Significant tenderness or worsening of abdominal pains  Swelling of the abdomen that is new, acute  Fever of 100F or higher  For urgent or emergent issues, a gastroenterologist can be reached at any hour by calling [phone]. Do not use MyChart messaging for urgent concerns.    DIET:  We do recommend a small  meal at first, but then you may proceed to your regular diet.  Drink plenty of fluids but you should avoid alcoholic beverages for 24 hours.  ACTIVITY:  You should plan to take it easy for the rest of today and you should NOT DRIVE or use heavy machinery until tomorrow (because of the sedation medicines used during the test).    FOLLOW UP: Our staff will call the number listed on your records the next business day following your procedure.  We will call around 7:15- 8:00 am to check on you and address any questions or concerns that you may have regarding the information given to you following your procedure. If we do not reach you, we will leave a message.     If any biopsies were taken you will be contacted by phone or by letter within the next 1-3 weeks.  Please call us  at [phone] if you have not heard about the biopsies in 3 weeks.    SIGNATURES/CONFIDENTIALITY: You and/or your care partner have signed paperwork which will be entered into your electronic medical record.  These signatures attest to the fact that that the information above on your After Visit Summary has been reviewed and is understood.  Full responsibility of the confidentiality of this discharge information lies with you and/or your care-partner.

## 2023-06-06 NOTE — Op Note (Addendum)
 [facility] Patient Name: Alison Fleming Procedure Date: 06/06/2023 1:04 PM MRN: [id number] Endoscopist: Sergio Dandy , MD, [id number] Age: 48 Referring MD:  Date of Birth: February 17, 1975 Gender: Female Account #: [id number] Procedure:                Colonoscopy Indications:              High risk colon cancer surveillance: Personal                            history of sessile serrated colon polyp (less than                            10 mm in size) with no dysplasia, High risk colon                            cancer surveillance: Ulcerative pancolitis of 8 (or                            more) years duration Procedure:                Pre-Anesthesia Assessment:                           - Prior to the procedure, a History and Physical                            was performed, and patient medications and                            allergies were reviewed. The patient's tolerance of                            previous anesthesia was also reviewed. The risks                            and benefits of the procedure and the sedation                            options and risks were discussed with the patient.                            All questions were answered, and informed consent                            was obtained. Prior Anticoagulants: The patient has                            taken no anticoagulant or antiplatelet agents. ASA                            Grade Assessment: II - A patient with mild systemic                            disease. After reviewing  the risks and benefits,                            the patient was deemed in satisfactory condition to                            undergo the procedure.                           After obtaining informed consent, the colonoscope                            was passed under direct vision. Throughout the                            procedure, the patient's blood pressure, pulse, and                            oxygen  saturations were monitored continuously. The                            Olympus Scope [id number] was introduced through the                            anus and advanced to the the cecum, identified by                            appendiceal orifice and ileocecal valve. The                            colonoscopy was performed without difficulty. The                            patient tolerated the procedure well. The quality                            of the bowel preparation was good. The ileocecal                            valve, appendiceal orifice, and rectum were                            photographed. Scope In: 1:08:05 PM Scope Out: 1:27:43 PM Scope Withdrawal Time: 0 hours 13 minutes 23 seconds  Total Procedure Duration: 0 hours 19 minutes 38 seconds  Findings:                 The perianal and digital rectal examinations were                            normal.                           A 5 mm polyp was found in the sigmoid colon. The  polyp was sessile. The polyp was removed with a                            cold snare. Resection and retrieval were complete.                           Inflammation was found as small patches surrounded                            by normal mucosa in the rectum, in the sigmoid                            colon, in the transverse colon and in the ascending                            colon. This was graded using the Ulcerative Colitis                            Endoscopic Index of Severity (UCEIS): patchy                            obliteration of vascular pattern (1). When compared                            to the previous examination, the findings are                            improved. Biopsies were taken with a cold forceps                            for histology.                           Non-bleeding external and internal hemorrhoids were                            found during retroflexion. The hemorrhoids were                             small. Complications:            No immediate complications. Estimated Blood Loss:     Estimated blood loss was minimal. Impression:               - One 5 mm polyp in the sigmoid colon, removed with                            a cold snare. Resected and retrieved.                           - Ulcerative colitis, in remission, improved since                            last examination. Biopsied.                           -  Non-bleeding external and internal hemorrhoids. Recommendation:           - Patient has a contact number available for                            emergencies. The signs and symptoms of potential                            delayed complications were discussed with the                            patient. Return to normal activities tomorrow.                            Written discharge instructions were provided to the                            patient.                           - Continue present medications.                           - Await pathology results.                           - Resume previous diet.                           - Repeat colonoscopy in 5 years for surveillance                            based on pathology results.                           - Return to GI office in 1 year for follow up for                            UC or sooner if needed                           . Mirtha Jain V. Ollie Delano, MD 06/06/2023 1:36:04 PM This report has been signed electronically.

## 2023-06-06 NOTE — Progress Notes (Signed)
 [facility] History and Physical   Primary Care Physician:  Rodney Clamp, MD   Reason for Procedure:  History of ulcerative colitis  Plan:    Surveillance colonoscopy with possible interventions as needed     HPI: Alison Fleming is a very pleasant 48 y.o. female here for surveillance colonoscopy. Denies any nausea, vomiting, abdominal pain, melena or bright red blood per rectum  The risks and benefits as well as alternatives of endoscopic procedure(s) have been discussed and reviewed. All questions answered. The patient agrees to proceed.    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   Anxiety    Atypical chest pain 04/24/2017   Dyspepsia, s/p normal EGD 03/08/17, with recommendation for lifetime anti-reflux regimen 03/26/2017   GAD (generalized anxiety disorder) 05/10/2016   History of spinal fusion for scoliosis 03/26/2017   HSV infection    Hyperlipidemia    Polyarthralgia 03/28/2017   Positive Lyme disease serology    Seasonal allergies    Tension headache 03/26/2017   Ulcerative colitis (HCC)    Vertigo     Past Surgical History:  Procedure Laterality Date   COLONOSCOPY  2019   DILATATION & CURETTAGE/HYSTEROSCOPY WITH MYOSURE N/A 07/13/2020   Procedure: DILATATION & CURETTAGE/HYSTEROSCOPY WITH MYOSURE;  Surgeon: Lillian Rein, MD;  Location: [facility] OR;  Service: Gynecology;  Laterality: N/A;   DILATATION & CURRETTAGE/HYSTEROSCOPY WITH RESECTOCOPE  02/12/2012   Procedure: DILATATION & CURETTAGE/HYSTEROSCOPY WITH RESECTOCOPE;  Surgeon: Laurent Pontes, MD;  Location: [facility] ORS;  Service: Gynecology;  Laterality: N/A;   DILATION AND CURETTAGE OF UTERUS  2010   DILATION AND CURETTAGE OF UTERUS  2012   SPINAL FUSION  2008    Prior to Admission medications   Medication Sig Start Date End Date Taking? Authorizing Provider  CRANBERRY PO Take 1 capsule by mouth once a week.    [provider]  cyclobenzaprine  (FLEXERIL ) 10  MG tablet Take 1 tablet (10 mg total) by mouth at bedtime as needed for muscle spasms. 05/29/23   Lillian Rein, MD  mesalamine  (LIALDA ) 1.2 g EC tablet Take 2 tablets (2.4 g total) by mouth daily with breakfast. 06/27/22   Summit Arroyave V, MD  MICROGESTIN  1-20 MG-MCG tablet Take 1 tablet by mouth daily. 05/29/23   Lillian Rein, MD  Multiple Vitamins-Minerals (MULTIVITAMIN GUMMIES WOMENS PO) Take 2 tablets by mouth daily.    [provider]  omeprazole  (PRILOSEC) 40 MG capsule Take 1 capsule (40 mg total) by mouth daily. 10/20/22   Rodney Clamp, MD  ondansetron  (ZOFRAN ) 4 MG tablet Use as directed with colonoscopy prep 02/22/23   Jahzier Villalon V, MD  triamcinolone  ointment (KENALOG ) 0.5 % Apply 1 Application topically 2 (two) times daily. 05/29/23   Lillian Rein, MD  valACYclovir  (VALTREX ) 1000 MG tablet TAKE 2 TABS BY MOUTH ONCE, REPEAT IN 12 HOURS WITH SYMPTOM ONSET. 07/13/22   Lillian Rein, MD    Current Outpatient Medications  Medication Sig Dispense Refill   CRANBERRY PO Take 1 capsule by mouth once a week.     cyclobenzaprine  (FLEXERIL ) 10 MG tablet Take 1 tablet (10 mg total) by mouth at bedtime as needed for muscle spasms. 30 tablet 0   mesalamine  (LIALDA ) 1.2 g EC tablet Take 2 tablets (2.4 g total) by mouth daily with breakfast. 180 tablet 3   MICROGESTIN  1-20 MG-MCG tablet Take 1 tablet by mouth daily. 84 tablet 4   Multiple Vitamins-Minerals (MULTIVITAMIN GUMMIES  WOMENS PO) Take 2 tablets by mouth daily.     omeprazole  (PRILOSEC) 40 MG capsule Take 1 capsule (40 mg total) by mouth daily. 90 capsule 3   ondansetron  (ZOFRAN ) 4 MG tablet Use as directed with colonoscopy prep 10 tablet 0   triamcinolone  ointment (KENALOG ) 0.5 % Apply 1 Application topically 2 (two) times daily. 30 g 1   valACYclovir  (VALTREX ) 1000 MG tablet TAKE 2 TABS BY MOUTH ONCE, REPEAT IN 12 HOURS WITH SYMPTOM ONSET. 90 tablet 1   Current Facility-Administered Medications  Medication Dose Route  Frequency Provider Last Rate Last Admin   0.9 %  sodium chloride  infusion  500 mL Intravenous Continuous Agostino Gorin V, MD        Allergies as of 06/06/2023   (No Known Allergies)    Family History  Problem Relation Age of Onset   Diabetes Maternal Grandmother    Hypertension Maternal Grandmother    Heart disease Maternal Grandmother    Heart attack Maternal Grandmother    Osteoporosis Maternal Grandmother    Diabetes Maternal Grandfather    Hypertension Maternal Grandfather    Heart disease Maternal Grandfather    Heart attack Maternal Grandfather    Hypertension Mother    Anxiety disorder Mother    Diabetes Mother    Heart murmur Brother    Colon cancer Neg Hx    Esophageal cancer Neg Hx    Rectal cancer Neg Hx    Stomach cancer Neg Hx    Breast cancer Neg Hx    Colon polyps Neg Hx     Social History   Socioeconomic History   Marital status: Married    Spouse name: Not on file   Number of children: 2   Years of education: Not on file   Highest education level: Bachelor's degree (e.g., BA, AB, BS)  Occupational History   Occupation: HUMAN RESOURCES    Employer: [employer]  Tobacco Use   Smoking status: Never   Smokeless tobacco: Never  Vaping Use   Vaping status: Never Used  Substance and Sexual Activity   Alcohol use: Yes    Alcohol/week: 2.0 - 3.0 standard drinks of alcohol    Types: 2 - 3 Standard drinks or equivalent per week   Drug use: No   Sexual activity: Yes    Partners: Male    Birth control/protection: OCP  Other Topics Concern   Not on file  Social History Narrative   Not on file   Social Drivers of Health   Financial Resource Strain: Low Risk  (05/23/2023)   Overall Financial Resource Strain (CARDIA)    Difficulty of Paying Living Expenses: Not hard at all  Food Insecurity: No Food Insecurity (05/23/2023)   Hunger Vital Sign    Worried About Running Out of Food in the Last Year: Never true    Ran Out of Food in the  Last Year: Never true  Transportation Needs: No Transportation Needs (05/23/2023)   PRAPARE - Administrator, Civil Service (Medical): No    Lack of Transportation (Non-Medical): No  Physical Activity: Unknown (05/23/2023)   Exercise Vital Sign    Days of Exercise per Week: 1 day    Minutes of Exercise per Session: Not on file  Stress: No Stress Concern Present (05/23/2023)   [employer] of Occupational Health - Occupational Stress Questionnaire    Feeling of Stress : Only a little  Social Connections: Unknown (05/23/2023)   Social Connection and Isolation Panel [  NHANES]    Frequency of Communication with Friends and Family: More than three times a week    Frequency of Social Gatherings with Friends and Family: Not on file    Attends Religious Services: Not on file    Active Member of Clubs or Organizations: Not on file    Attends Banker Meetings: Not on file    Marital Status: Married  Intimate Partner Violence: Not on file    Review of Systems:  All other review of systems negative except as mentioned in the HPI.  Physical Exam: Vital signs in last 24 hours: BP 109/72   Pulse 81   Temp 98.1 F (36.7 C) (Temporal)   Ht 5\' 6"  (1.676 m)   Wt 153 lb (69.4 kg)   LMP 05/08/2023 (Approximate)   SpO2 97%   BMI 24.69 kg/m  General:   Alert, NAD Lungs:  Clear .   Heart:  Regular rate and rhythm Abdomen:  Soft, nontender and nondistended. Neuro/Psych:  Alert and cooperative. Normal mood and affect. A and O x 3  Reviewed labs, radiology imaging, old records and pertinent past GI work up  Patient is appropriate for planned procedure(s) and anesthesia in an ambulatory setting   K. Veena Ricky Gallery , MD [phone]

## 2023-06-06 NOTE — Progress Notes (Signed)
 Sedate, gd SR, tolerated procedure well, VSS, report to RN

## 2023-06-07 ENCOUNTER — Telehealth: Payer: Self-pay

## 2023-06-07 ENCOUNTER — Other Ambulatory Visit (HOSPITAL_BASED_OUTPATIENT_CLINIC_OR_DEPARTMENT_OTHER): Payer: Self-pay

## 2023-06-07 LAB — CORTISOL: Cortisol: 7.2 ug/dL (ref 6.2–19.4)

## 2023-06-07 LAB — FOLLICLE STIMULATING HORMONE: FSH: 10.7 m[IU]/mL

## 2023-06-07 LAB — ESTRADIOL: Estradiol: 207 pg/mL

## 2023-06-07 NOTE — Telephone Encounter (Signed)
  Follow up Call-     06/06/2023   12:42 PM  Call back number  Post procedure Call Back phone  # [phone]  Permission to leave phone message Yes     Patient questions:  Do you have a fever, pain , or abdominal swelling? No. Pain Score  0 *  Have you tolerated food without any problems? Yes.    Have you been able to return to your normal activities? Yes.    Do you have any questions about your discharge instructions: Diet   No. Medications  No. Follow up visit  No.  Do you have questions or concerns about your Care? No.  Actions: * If pain score is 4 or above: No action needed, pain <4.

## 2023-06-09 LAB — CALPROTECTIN, FECAL: Calprotectin, Fecal: 64 ug/g (ref 0–120)

## 2023-06-12 ENCOUNTER — Ambulatory Visit: Payer: Self-pay | Admitting: Gastroenterology

## 2023-06-12 LAB — SURGICAL PATHOLOGY

## 2023-06-12 MED ORDER — MESALAMINE 1.2 G PO TBEC: 4.8000 g | DELAYED_RELEASE_TABLET | Freq: Every day | ORAL | 3 refills | Status: AC

## 2023-06-18 ENCOUNTER — Ambulatory Visit (HOSPITAL_BASED_OUTPATIENT_CLINIC_OR_DEPARTMENT_OTHER): Payer: Self-pay | Admitting: Obstetrics & Gynecology

## 2023-06-26 ENCOUNTER — Other Ambulatory Visit (HOSPITAL_BASED_OUTPATIENT_CLINIC_OR_DEPARTMENT_OTHER): Payer: Self-pay | Admitting: Obstetrics & Gynecology

## 2023-06-26 DIAGNOSIS — G44209 Tension-type headache, unspecified, not intractable: Secondary | ICD-10-CM

## 2023-07-30 ENCOUNTER — Other Ambulatory Visit (HOSPITAL_BASED_OUTPATIENT_CLINIC_OR_DEPARTMENT_OTHER): Payer: Self-pay | Admitting: Obstetrics & Gynecology

## 2023-07-30 DIAGNOSIS — G44209 Tension-type headache, unspecified, not intractable: Secondary | ICD-10-CM

## 2023-08-17 ENCOUNTER — Ambulatory Visit: Payer: Self-pay | Admitting: Gastroenterology

## 2023-08-31 ENCOUNTER — Other Ambulatory Visit (HOSPITAL_BASED_OUTPATIENT_CLINIC_OR_DEPARTMENT_OTHER): Payer: Self-pay | Admitting: Certified Nurse Midwife

## 2023-08-31 DIAGNOSIS — G44209 Tension-type headache, unspecified, not intractable: Secondary | ICD-10-CM

## 2023-10-05 ENCOUNTER — Telehealth: Payer: Self-pay | Admitting: Family Medicine

## 2023-10-05 NOTE — Telephone Encounter (Signed)
 Called pt to r/s CPE from 10/10 due to PCP being out of office. Next CPE is available in January. Pt verbalized understanding but is wanting to know if can be seen sooner. Patient stated this same thing happened to her last year and she doesn't want to go more than a year without a CPE.

## 2023-10-09 ENCOUNTER — Other Ambulatory Visit (HOSPITAL_BASED_OUTPATIENT_CLINIC_OR_DEPARTMENT_OTHER): Payer: Self-pay | Admitting: Certified Nurse Midwife

## 2023-10-09 DIAGNOSIS — G44209 Tension-type headache, unspecified, not intractable: Secondary | ICD-10-CM

## 2023-10-10 NOTE — Telephone Encounter (Signed)
 Left patient vm to call back to schedule CPE any date after 10/4.

## 2023-10-10 NOTE — Telephone Encounter (Signed)
 Ok to [employer] CPE into any available slot.   Worth HERO. Kennyth, MD 10/10/2023 8:07 AM

## 2023-10-10 NOTE — Telephone Encounter (Signed)
**Note De-identified  [place] Obfuscation** Please advise 

## 2023-10-22 ENCOUNTER — Encounter: Payer: Self-pay | Admitting: Family Medicine

## 2023-10-22 ENCOUNTER — Ambulatory Visit (INDEPENDENT_AMBULATORY_CARE_PROVIDER_SITE_OTHER): Admitting: Family Medicine

## 2023-10-22 VITALS — BP 124/80 | HR 87 | Temp 98.0°F | Ht 66.0 in | Wt 158.6 lb

## 2023-10-22 DIAGNOSIS — Z13220 Encounter for screening for lipoid disorders: Secondary | ICD-10-CM | POA: Diagnosis not present

## 2023-10-22 DIAGNOSIS — Z0001 Encounter for general adult medical examination with abnormal findings: Secondary | ICD-10-CM

## 2023-10-22 DIAGNOSIS — Z131 Encounter for screening for diabetes mellitus: Secondary | ICD-10-CM | POA: Diagnosis not present

## 2023-10-22 DIAGNOSIS — K5180 Other ulcerative colitis without complications: Secondary | ICD-10-CM | POA: Diagnosis not present

## 2023-10-22 LAB — COMPREHENSIVE METABOLIC PANEL WITH GFR
ALT: 14 U/L (ref 0–35)
AST: 18 U/L (ref 0–37)
Albumin: 4.4 g/dL (ref 3.5–5.2)
Alkaline Phosphatase: 38 U/L — ABNORMAL LOW (ref 39–117)
BUN: 12 mg/dL (ref 6–23)
CO2: 25 meq/L (ref 19–32)
Calcium: 9.8 mg/dL (ref 8.4–10.5)
Chloride: 102 meq/L (ref 96–112)
Creatinine, Ser: 0.79 mg/dL (ref 0.40–1.20)
GFR: 89.44 mL/min (ref >60.00)
Glucose, Bld: 84 mg/dL (ref 70–99)
Potassium: 4.6 meq/L (ref 3.5–5.1)
Sodium: 138 meq/L (ref 135–145)
Total Bilirubin: 0.5 mg/dL (ref 0.2–1.2)
Total Protein: 7.6 g/dL (ref 6.0–8.3)

## 2023-10-22 LAB — CBC
HCT: 38.4 % (ref 36.0–46.0)
Hemoglobin: 13.1 g/dL (ref 12.0–15.0)
MCHC: 34.1 g/dL (ref 30.0–36.0)
MCV: 89.1 fl (ref 78.0–100.0)
Platelets: 325 K/uL (ref 150.0–400.0)
RBC: 4.31 Mil/uL (ref 3.87–5.11)
RDW: 12.9 % (ref 11.5–15.5)
WBC: 6 K/uL (ref 4.0–10.5)

## 2023-10-22 LAB — LIPID PANEL
Cholesterol: 209 mg/dL — ABNORMAL HIGH (ref 0–200)
HDL: 64.8 mg/dL (ref >39.00)
LDL Cholesterol: 122 mg/dL — ABNORMAL HIGH (ref 0–99)
NonHDL: 143.74
Total CHOL/HDL Ratio: 3
Triglycerides: 109 mg/dL (ref 0.0–149.0)
VLDL: 21.8 mg/dL (ref 0.0–40.0)

## 2023-10-22 LAB — TSH: TSH: 1.3 u[IU]/mL (ref 0.35–5.50)

## 2023-10-22 LAB — HEMOGLOBIN A1C: Hgb A1c MFr Bld: 5.4 % (ref 4.6–6.5)

## 2023-10-22 NOTE — Addendum Note (Signed)
 Addended by: KENNYTH WORTH HERO on: 10/22/2023 09:47 AM   Modules accepted: Orders

## 2023-10-22 NOTE — Patient Instructions (Signed)
 It was very nice to see you today!  VISIT SUMMARY: You had your annual physical exam today. Your preventive care is up to date, and we discussed your diet, exercise, and concerns about sugar intake. We also planned some blood work to monitor your A1c and cholesterol levels.  YOUR PLAN: ADULT WELLNESS VISIT: Annual wellness visit completed with preventive screenings and vaccinations up to date. -We will order blood work to check your A1c and cholesterol levels.  DIET AND EXERCISE COUNSELING: Intermittent adherence to diet and exercise noted. Discussed insulin resistance, diabetes, and cholesterol. -Encouraged gradual lifestyle changes and potential benefits of fasting. -Recommend starting with a 24-hour fast to assess tolerance. -Continue walking and reducing carbohydrate intake. -Consider using motivational tools like sticker charts or apps for tracking progress.  Return in about 1 year (around 10/21/2024).   Take care, Dr Kennyth  PLEASE NOTE:  If you had any lab tests, please let us  know if you have not heard back within a few days. You may see your results on mychart before we have a chance to review them but we will give you a call once they are reviewed by us .   If we ordered any referrals today, please let us  know if you have not heard from their office within the next week.   If you had any urgent prescriptions sent in today, please check with the pharmacy within an hour of our visit to make sure the prescription was transmitted appropriately.   Please try these tips to maintain a healthy lifestyle:  Eat at least 3 REAL meals and 1-2 snacks per day.  Aim for no more than 5 hours between eating.  If you eat breakfast, please do so within one hour of getting up.   Each meal should contain half fruits/vegetables, one quarter protein, and one quarter carbs (no bigger than a computer mouse)  Cut down on sweet beverages. This includes juice, soda, and sweet tea.   Drink at least 1  glass of water with each meal and aim for at least 8 glasses per day  Exercise at least 150 minutes every week.    Preventive Care 15-73 Years Old, Female Preventive care refers to lifestyle choices and visits with your health care provider that can promote health and wellness. Preventive care visits are also called wellness exams. What can I expect for my preventive care visit? Counseling Your health care provider may ask you questions about your: Medical history, including: Past medical problems. Family medical history. Pregnancy history. Current health, including: Menstrual cycle. Method of birth control. Emotional well-being. Home life and relationship well-being. Sexual activity and sexual health. Lifestyle, including: Alcohol, nicotine or tobacco, and drug use. Access to firearms. Diet, exercise, and sleep habits. Work and work Astronomer. Sunscreen use. Safety issues such as seatbelt and bike helmet use. Physical exam Your health care provider will check your: Height and weight. These may be used to calculate your BMI (body mass index). BMI is a measurement that tells if you are at a healthy weight. Waist circumference. This measures the distance around your waistline. This measurement also tells if you are at a healthy weight and may help predict your risk of certain diseases, such as type 2 diabetes and high blood pressure. Heart rate and blood pressure. Body temperature. Skin for abnormal spots. What immunizations do I need?  Vaccines are usually given at various ages, according to a schedule. Your health care provider will recommend vaccines for you based on your age, medical  history, and lifestyle or other factors, such as travel or where you work. What tests do I need? Screening Your health care provider may recommend screening tests for certain conditions. This may include: Lipid and cholesterol levels. Diabetes screening. This is done by checking your blood  sugar (glucose) after you have not eaten for a while (fasting). Pelvic exam and Pap test. Hepatitis B test. Hepatitis C test. HIV (human immunodeficiency virus) test. STI (sexually transmitted infection) testing, if you are at risk. Lung cancer screening. Colorectal cancer screening. Mammogram. Talk with your health care provider about when you should start having regular mammograms. This may depend on whether you have a family history of breast cancer. BRCA-related cancer screening. This may be done if you have a family history of breast, ovarian, tubal, or peritoneal cancers. Bone density scan. This is done to screen for osteoporosis. Talk with your health care provider about your test results, treatment options, and if necessary, the need for more tests. Follow these instructions at home: Eating and drinking  Eat a diet that includes fresh fruits and vegetables, whole grains, lean protein, and low-fat dairy products. Take vitamin and mineral supplements as recommended by your health care provider. Do not drink alcohol if: Your health care provider tells you not to drink. You are pregnant, may be pregnant, or are planning to become pregnant. If you drink alcohol: Limit how much you have to 0-1 drink a day. Know how much alcohol is in your drink. In the [place] one drink equals one 12 oz bottle of beer (355 mL), one 5 oz glass of wine (148 mL), or one 1 oz glass of hard liquor (44 mL). Lifestyle Brush your teeth every morning and night with fluoride toothpaste. Floss one time each day. Exercise for at least 30 minutes 5 or more days each week. Do not use any products that contain nicotine or tobacco. These products include cigarettes, chewing tobacco, and vaping devices, such as e-cigarettes. If you need help quitting, ask your health care provider. Do not use drugs. If you are sexually active, practice safe sex. Use a condom or other form of protection to prevent STIs. If you do not  wish to become pregnant, use a form of birth control. If you plan to become pregnant, see your health care provider for a prepregnancy visit. Take aspirin only as told by your health care provider. Make sure that you understand how much to take and what form to take. Work with your health care provider to find out whether it is safe and beneficial for you to take aspirin daily. Find healthy ways to manage stress, such as: Meditation, yoga, or listening to music. Journaling. Talking to a trusted person. Spending time with friends and family. Minimize exposure to UV radiation to reduce your risk of skin cancer. Safety Always wear your seat belt while driving or riding in a vehicle. Do not drive: If you have been drinking alcohol. Do not ride with someone who has been drinking. When you are tired or distracted. While texting. If you have been using any mind-altering substances or drugs. Wear a helmet and other protective equipment during sports activities. If you have firearms in your house, make sure you follow all gun safety procedures. Seek help if you have been physically or sexually abused. What's next? Visit your health care provider once a year for an annual wellness visit. Ask your health care provider how often you should have your eyes and teeth checked. Stay up to date  on all vaccines. This information is not intended to replace advice given to you by your health care provider. Make sure you discuss any questions you have with your health care provider. Document Revised: 06/30/2020 Document Reviewed: 06/30/2020 Elsevier Patient Education  2024 [employer].

## 2023-10-22 NOTE — Assessment & Plan Note (Signed)
 Follows with GI.  Symptoms are controlled on mesalamine  4.8 g daily

## 2023-10-22 NOTE — Progress Notes (Signed)
 Chief Complaint:  Alison Fleming is a 48 y.o. female who presents today for her annual comprehensive physical exam.    Assessment/Plan:  Chronic Problems Addressed Today: Right sided ulcerative colitis (HCC) Follows with GI.  Symptoms are controlled on mesalamine  4.8 g daily  Preventative Healthcare: Check labs. Up to date on colonoscopy.  Follows with gynecology for women's health.  Patient Counseling(The following topics were reviewed and/or handout was given):  -Nutrition: Stressed importance of moderation in sodium/caffeine intake, saturated fat and cholesterol, caloric balance, sufficient intake of fresh fruits, vegetables, and fiber.  -Stressed the importance of regular exercise.   -Substance Abuse: Discussed cessation/primary prevention of tobacco, alcohol, or other drug use; driving or other dangerous activities under the influence; availability of treatment for abuse.   -Injury prevention: Discussed safety belts, safety helmets, smoke detector, smoking near bedding or upholstery.   -Sexuality: Discussed sexually transmitted diseases, partner selection, use of condoms, avoidance of unintended pregnancy and contraceptive alternatives.   -Dental health: Discussed importance of regular tooth brushing, flossing, and dental visits.  -Health maintenance and immunizations reviewed. Please refer to Health maintenance section.  Return to care in 1 year for next preventative visit.     Subjective:  HPI:  She has no acute complaints today. Patient is here today for her annual physical.  See assessment / plan for status of chronic conditions.  Discussed the use of AI scribe software for clinical note transcription with the patient, who gave verbal consent to proceed.  History of Present Illness Alison Fleming is a 48 year old female who presents for an annual physical exam.  She reports that her osteoclast condition has been stable since her last visit a year ago. She has been  following up with her gynecologist and GI specialists, and her medications have been reduced.  She is up to date with her preventive care, having completed her Pap smear, colonoscopy, and mammogram earlier this year. She is also current on her vaccinations.  Regarding her diet and exercise, she and her husband were diligent over the summer, losing ten pounds through walking and reducing carbohydrate intake. However, she struggled to maintain this routine after a vacation. She has previously fasted from 6 PM to 10 or 11 AM and found it manageable.  She is concerned about her sugar intake and experiences dizziness when her sugar levels drop, particularly when she was more active and mindful of her diet. Her family history includes diabetes, and she is interested in monitoring her A1c levels through blood work.  Her vision is generally good, though she requires more light to read and needs to hold things further away, which she attributes to aging. No issues with hearing.   Lifestyle Diet: More fruits and vegetables. Cutting down on carbohydrates.  Exercise: More walking     10/22/2023    9:10 AM  Depression screen PHQ 2/9  Decreased Interest 0  Down, Depressed, Hopeless 0  PHQ - 2 Score 0    There are no preventive care reminders to display for this patient.   ROS: Per HPI, otherwise a complete review of systems was negative.   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   Anxiety    Atypical chest pain 04/24/2017   Dyspepsia, s/p normal EGD 03/08/17, with recommendation for lifetime anti-reflux regimen 03/26/2017   GAD (generalized anxiety disorder) 05/10/2016   History of spinal fusion for scoliosis 03/26/2017  HSV infection    Hyperlipidemia    Polyarthralgia 03/28/2017   Positive Lyme disease serology    Seasonal allergies    Tension headache 03/26/2017   Ulcerative colitis [facility])     Vertigo    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   Encounter for surveillance of contraceptive pills 04/26/2020   Inclusion cyst of vulva 04/26/2020   Polyarthralgia 03/28/2017   Dyspepsia, s/p normal EGD 03/08/17, with recommendation for lifetime anti-reflux regimen 03/26/2017   Cervical pain with history of scoliosis 03/26/2017   Tension headache 03/26/2017   Past Surgical History:  Procedure Laterality Date   COLONOSCOPY  2019   DILATATION & CURETTAGE/HYSTEROSCOPY WITH MYOSURE N/A 07/13/2020   Procedure: DILATATION & CURETTAGE/HYSTEROSCOPY WITH MYOSURE;  Surgeon: Cleotilde Ronal RAMAN, MD;  Location: [facility] OR;  Service: Gynecology;  Laterality: N/A;   DILATATION & CURRETTAGE/HYSTEROSCOPY WITH RESECTOCOPE  02/12/2012   Procedure: DILATATION & CURETTAGE/HYSTEROSCOPY WITH RESECTOCOPE;  Surgeon: Randine VEAR Fender, MD;  Location: [facility] ORS;  Service: Gynecology;  Laterality: N/A;   DILATION AND CURETTAGE OF UTERUS  2010   DILATION AND CURETTAGE OF UTERUS  2012   SPINAL FUSION  2008    Family History  Problem Relation Age of Onset   Diabetes Maternal Grandmother    Hypertension Maternal Grandmother    Heart disease Maternal Grandmother    Heart attack Maternal Grandmother    Osteoporosis Maternal Grandmother    Diabetes Maternal Grandfather    Hypertension Maternal Grandfather    Heart disease Maternal Grandfather    Heart attack Maternal Grandfather    Hypertension Mother    Anxiety disorder Mother    Diabetes Mother    Heart murmur Brother    Colon cancer Neg Hx    Esophageal cancer Neg Hx    Rectal cancer Neg Hx    Stomach cancer Neg Hx    Breast cancer Neg Hx    Colon polyps Neg Hx     Medications- reviewed and updated Current Outpatient Medications  Medication Sig Dispense Refill   CRANBERRY PO Take 1 capsule by mouth once a week.      cyclobenzaprine  (FLEXERIL ) 10 MG tablet TAKE 1 TABLET BY MOUTH AT BEDTIME AS NEEDED FOR MUSCLE SPASMS. 30 tablet 0   mesalamine  (LIALDA ) 1.2 g EC tablet Take 2 tablets (2.4 g total) by mouth daily with breakfast. 180 tablet 3   mesalamine  (LIALDA ) 1.2 g EC tablet Take 4 tablets (4.8 g total) by mouth daily with breakfast. 360 tablet 3   MICROGESTIN  1-20 MG-MCG tablet Take 1 tablet by mouth daily. 84 tablet 4   Multiple Vitamins-Minerals (MULTIVITAMIN GUMMIES WOMENS PO) Take 2 tablets by mouth daily.     omeprazole  (PRILOSEC) 40 MG capsule Take 1 capsule (40 mg total) by mouth daily. 90 capsule 3   tretinoin (RETIN-A) 0.025 % cream Apply pea sized amount in the evening to face Externally Once a day; Duration: 30 days     valACYclovir  (VALTREX ) 1000 MG tablet TAKE 2 TABS BY MOUTH ONCE, REPEAT IN 12 HOURS WITH SYMPTOM ONSET. 90 tablet 1   No current facility-administered medications for this visit.    Allergies-reviewed and updated No Known Allergies  Social History   Socioeconomic History   Marital status: Married    Spouse name: Not on file   Number of children: 2   Years of education:  Not on file   Highest education level: Bachelor's degree (e.g., BA, AB, BS)  Occupational History   Occupation: HUMAN RESOURCES    Employer: [employer]  Tobacco Use   Smoking status: Never   Smokeless tobacco: Never  Vaping Use   Vaping status: Never Used  Substance and Sexual Activity   Alcohol use: Yes    Alcohol/week: 2.0 - 3.0 standard drinks of alcohol    Types: 2 - 3 Standard drinks or equivalent per week   Drug use: No   Sexual activity: Yes    Partners: Male    Birth control/protection: OCP  Other Topics Concern   Not on file  Social History Narrative   Not on file   Social Drivers of Health   Financial Resource Strain: Low Risk  (10/18/2023)   Overall Financial Resource Strain (CARDIA)    Difficulty of Paying Living Expenses: Not hard at all  Food Insecurity:  Unknown (10/18/2023)   Hunger Vital Sign    Worried About Running Out of Food in the Last Year: Never true    Ran Out of Food in the Last Year: Not on file  Transportation Needs: No Transportation Needs (10/18/2023)   PRAPARE - Administrator, Civil Service (Medical): No    Lack of Transportation (Non-Medical): No  Physical Activity: Insufficiently Active (10/18/2023)   Exercise Vital Sign    Days of Exercise per Week: 1 day    Minutes of Exercise per Session: 30 min  Stress: No Stress Concern Present (10/18/2023)   [employer] of Occupational Health - Occupational Stress Questionnaire    Feeling of Stress: Only a little  Social Connections: Unknown (10/18/2023)   Social Connection and Isolation Panel    Frequency of Communication with Friends and Family: More than three times a week    Frequency of Social Gatherings with Friends and Family: Once a week    Attends Religious Services: Patient declined    Database administrator or Organizations: Patient declined    Attends Engineer, structural: Not on file    Marital Status: Married        Objective:  Physical Exam: BP 124/80   Pulse 87   Temp 98 F (36.7 C) (Temporal)   Ht 5' 6 (1.676 m)   Wt 158 lb 9.6 oz (71.9 kg)   LMP 08/27/2023   SpO2 99%   BMI 25.60 kg/m   Body mass index is 25.6 kg/m. Wt Readings from Last 3 Encounters:  10/22/23 158 lb 9.6 oz (71.9 kg)  06/06/23 153 lb (69.4 kg)  05/29/23 156 lb 12.8 oz (71.1 kg)   Gen: NAD, resting comfortably HEENT: TMs normal bilaterally. OP clear. No thyromegaly noted.  CV: RRR with no murmurs appreciated Pulm: NWOB, CTAB with no crackles, wheezes, or rhonchi GI: Normal bowel sounds present. Soft, Nontender, Nondistended. MSK: no edema, cyanosis, or clubbing noted Skin: warm, dry Neuro: CN2-12 grossly intact. Strength 5/5 in upper and lower extremities. Reflexes symmetric and intact bilaterally.  Psych: Normal affect and thought content      Nanda Bittick M. Kennyth, MD 10/22/2023 9:43 AM

## 2023-10-23 ENCOUNTER — Ambulatory Visit: Payer: Self-pay | Admitting: Family Medicine

## 2023-10-23 NOTE — Progress Notes (Signed)
 Cholesterol levels are mildly elevated but about the same as last year.  All of your other labs are at goal.  Do not need to make any other changes to treatment plan at this time.  She should keep up the great work with diet and exercise and we can recheck everything in a year or so.

## 2023-10-26 ENCOUNTER — Encounter: Payer: 59 | Admitting: Family Medicine

## 2023-10-27 ENCOUNTER — Other Ambulatory Visit: Payer: Self-pay | Admitting: Family Medicine

## 2023-11-19 ENCOUNTER — Other Ambulatory Visit (HOSPITAL_BASED_OUTPATIENT_CLINIC_OR_DEPARTMENT_OTHER): Payer: Self-pay | Admitting: Certified Nurse Midwife

## 2023-11-19 DIAGNOSIS — G44209 Tension-type headache, unspecified, not intractable: Secondary | ICD-10-CM

## 2023-11-19 NOTE — Telephone Encounter (Signed)
 Left message for patient to call office back regarding medication refill.  Morna LOISE Quale, RN

## 2023-11-30 ENCOUNTER — Ambulatory Visit: Payer: Self-pay

## 2023-11-30 ENCOUNTER — Ambulatory Visit (INDEPENDENT_AMBULATORY_CARE_PROVIDER_SITE_OTHER): Admitting: Family Medicine

## 2023-11-30 ENCOUNTER — Other Ambulatory Visit (HOSPITAL_COMMUNITY)
Admission: RE | Admit: 2023-11-30 | Discharge: 2023-11-30 | Disposition: A | Discharge status: Home / Self Care | Source: Ambulatory Visit | Attending: Family Medicine | Admitting: Family Medicine

## 2023-11-30 VITALS — BP 118/62 | HR 76 | Temp 98.5°F | Ht 66.0 in | Wt 163.6 lb

## 2023-11-30 DIAGNOSIS — N760 Acute vaginitis: Secondary | ICD-10-CM

## 2023-11-30 DIAGNOSIS — N898 Other specified noninflammatory disorders of vagina: Secondary | ICD-10-CM | POA: Diagnosis present

## 2023-11-30 MED ORDER — FLUCONAZOLE 150 MG PO TABS: 150.0000 mg | ORAL_TABLET | Freq: Once | ORAL | 0 refills | Status: AC

## 2023-11-30 NOTE — Telephone Encounter (Signed)
 Appt today

## 2023-11-30 NOTE — Telephone Encounter (Signed)
 FYI Only or Action Required?: FYI only for provider: appointment scheduled on 11/30/2023.  Patient was last seen in primary care on 10/22/2023 by Kennyth Worth HERO, MD.  Called Nurse Triage reporting Vaginal Itching.  Symptoms began several days ago.  Interventions attempted: Nothing.  Symptoms are: gradually worsening.  Triage Disposition: See Physician Within 24 Hours  Patient/caregiver understands and will follow disposition?: Yes  Copied from CRM #[id number]. Topic: Clinical - Red Word Triage >> Nov 30, 2023  1:48 PM Victoria A wrote: [facility] that prompted transfer to Nurse Triage: Yeast that is itchy, pain. Patient notice yesterday but has become worse since Reason for Disposition  MODERATE-SEVERE itching (i.e., interferes with school, work, or sleep)  Answer Assessment - Initial Assessment Questions 1. SYMPTOM: What's the main symptom you're concerned about? (e.g., pain, itching, dryness)     Itching burning, scant discharge, mild foul odor 2. LOCATION: Where is the  itching located? (e.g., inside/outside, left/right)     both 3. ONSET: When did the  symptoms  start?     Two days ago 4. PAIN: Is there any pain? If Yes, ask: How bad is it? (Scale: 1-10; mild, moderate, severe)     mild 5. ITCHING: Is there any itching? If Yes, ask: How bad is it? (Scale: 1-10; mild, moderate, severe)     severe 6. CAUSE: What do you think is causing the discharge? Have you had the same problem before? What happened then?     unknown  Protocols used: Vaginal Symptoms-A-AH

## 2023-11-30 NOTE — Progress Notes (Signed)
 Established Patient Office Visit   Subjective  Patient ID: Alison Fleming, female    DOB: 02-18-1975  Age: 48 y.o. MRN: [id number]  Chief Complaint  Patient presents with   Acute Visit    Patient came in today for Vaginal discharge, burning, itching, odor,     Patient is a 48 year old female seen for acute concern.  Patient endorses vaginal discharge, pruritus, vaginal odor, and burning times a few days.  Unsure on color of discharge.  Patient states symptoms are similar to prior yeast infections.  Denies nausea, vomiting, back pain, constipation, changes in soaps, lotions, detergents, medications, or recent intercourse.  Has not tried anything OTC for symptoms.    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   Encounter for surveillance of contraceptive pills 04/26/2020   Inclusion cyst of vulva 04/26/2020   Polyarthralgia 03/28/2017   Dyspepsia, s/p normal EGD 03/08/17, with recommendation for lifetime anti-reflux regimen 03/26/2017   Cervical pain with history of scoliosis 03/26/2017   Tension headache 03/26/2017   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   Anxiety    Atypical chest pain 04/24/2017   Dyspepsia, s/p normal EGD 03/08/17, with recommendation for lifetime anti-reflux regimen 03/26/2017   GAD (generalized anxiety disorder) 05/10/2016   GERD (gastroesophageal reflux disease)    History of spinal fusion for scoliosis 03/26/2017   HSV infection    Hyperlipidemia    Polyarthralgia 03/28/2017   Positive Lyme disease serology    Seasonal allergies    Tension headache 03/26/2017   Ulcerative colitis (HCC)    Vertigo    Past Surgical History:  Procedure Laterality Date   COLONOSCOPY  2019   DILATATION & CURETTAGE/HYSTEROSCOPY WITH MYOSURE N/A 07/13/2020   Procedure: DILATATION &  CURETTAGE/HYSTEROSCOPY WITH MYOSURE;  Surgeon: Cleotilde Ronal RAMAN, MD;  Location: [facility] OR;  Service: Gynecology;  Laterality: N/A;   DILATATION & CURRETTAGE/HYSTEROSCOPY WITH RESECTOCOPE  02/12/2012   Procedure: DILATATION & CURETTAGE/HYSTEROSCOPY WITH RESECTOCOPE;  Surgeon: Randine VEAR Fender, MD;  Location: [facility] ORS;  Service: Gynecology;  Laterality: N/A;   DILATION AND CURETTAGE OF UTERUS  2010   DILATION AND CURETTAGE OF UTERUS  2012   SPINAL FUSION  2008   Social History   Tobacco Use   Smoking status: Never   Smokeless tobacco: Never  Vaping Use   Vaping status: Never Used  Substance Use Topics   Alcohol use: Yes    Alcohol/week: 2.0 - 3.0 standard drinks of alcohol    Types: 2 - 3 Standard drinks or equivalent per week   Drug use: No   Family History  Problem Relation Age of Onset   Diabetes Maternal Grandmother    Hypertension Maternal Grandmother    Heart disease Maternal Grandmother    Heart attack Maternal Grandmother    Osteoporosis Maternal Grandmother    Arthritis Maternal Grandmother    Vision loss Maternal Grandmother    Diabetes Maternal Grandfather    Hypertension Maternal Grandfather    Heart disease Maternal Grandfather    Heart attack Maternal Grandfather    Arthritis Maternal Grandfather    Hypertension Mother    Anxiety disorder Mother    Diabetes Mother    Arthritis Mother    Heart murmur Brother    Colon cancer Neg Hx    Esophageal cancer Neg Hx    Rectal  cancer Neg Hx    Stomach cancer Neg Hx    Breast cancer Neg Hx    Colon polyps Neg Hx    No Known Allergies  ROS Negative unless stated above    Objective:     BP 118/62 (BP Location: Left Arm, Patient Position: Sitting, Cuff Size: Large)   Pulse 76   Temp 98.5 F (36.9 C) (Oral)   Ht 5' 6 (1.676 m)   Wt 163 lb 9.6 oz (74.2 kg)   SpO2 98%   BMI 26.41 kg/m  BP Readings from Last 3 Encounters:  11/30/23 118/62  10/22/23 124/80  06/06/23 108/65   Wt Readings from Last 3 Encounters:   11/30/23 163 lb 9.6 oz (74.2 kg)  10/22/23 158 lb 9.6 oz (71.9 kg)  06/06/23 153 lb (69.4 kg)      Physical Exam Constitutional:      Appearance: Normal appearance.  HENT:     Head: Normocephalic and atraumatic.     Mouth/Throat:     Mouth: Mucous membranes are moist.  Cardiovascular:     Rate and Rhythm: Normal rate.  Pulmonary:     Effort: Pulmonary effort is normal.  Abdominal:     General: Abdomen is flat. Bowel sounds are normal.     Palpations: Abdomen is soft.     Tenderness: There is no abdominal tenderness. There is no guarding or rebound.  Genitourinary:    Comments: Aptima self swab collected. Neurological:     Mental Status: She is alert and oriented to person, place, and time.     No results found for any visits on 11/30/23.    Assessment & Plan:   Acute vaginitis -     Fluconazole ; Take 1 tablet (150 mg total) by mouth once for 1 dose.  Dispense: 1 tablet; Refill: 0  Vaginal discharge -     Cervicovaginal ancillary only -     Fluconazole ; Take 1 tablet (150 mg total) by mouth once for 1 dose.  Dispense: 1 tablet; Refill: 0   Acute vagitis.  Aptima self swab collected.  Start diflucan .  Further recommendations if needed based on results.  F/u with pcp and OB/Gyn for continued symptoms.    Return if symptoms worsen or fail to improve.   Clotilda JONELLE Single, MD

## 2023-12-03 ENCOUNTER — Ambulatory Visit (HOSPITAL_BASED_OUTPATIENT_CLINIC_OR_DEPARTMENT_OTHER)

## 2023-12-04 ENCOUNTER — Encounter: Payer: Self-pay | Admitting: Family Medicine

## 2023-12-04 LAB — CERVICOVAGINAL ANCILLARY ONLY
Bacterial Vaginitis (gardnerella): NEGATIVE
Candida Glabrata: NEGATIVE
Candida Vaginitis: POSITIVE — AB
Comment: NEGATIVE
Comment: NEGATIVE
Comment: NEGATIVE

## 2023-12-05 ENCOUNTER — Ambulatory Visit: Payer: Self-pay | Admitting: Family Medicine

## 2023-12-19 ENCOUNTER — Ambulatory Visit (HOSPITAL_BASED_OUTPATIENT_CLINIC_OR_DEPARTMENT_OTHER): Admitting: Certified Nurse Midwife

## 2023-12-27 ENCOUNTER — Other Ambulatory Visit (HOSPITAL_BASED_OUTPATIENT_CLINIC_OR_DEPARTMENT_OTHER): Payer: Self-pay | Admitting: Certified Nurse Midwife

## 2023-12-27 DIAGNOSIS — G44209 Tension-type headache, unspecified, not intractable: Secondary | ICD-10-CM

## 2023-12-31 ENCOUNTER — Encounter (HOSPITAL_BASED_OUTPATIENT_CLINIC_OR_DEPARTMENT_OTHER): Payer: Self-pay | Admitting: Obstetrics & Gynecology

## 2024-01-21 ENCOUNTER — Telehealth: Payer: Self-pay | Admitting: Family Medicine

## 2024-01-21 NOTE — Telephone Encounter (Signed)
 Looking into this. Will update patient once we have more information.

## 2024-01-21 NOTE — Telephone Encounter (Signed)
 Copied from CRM [id number]. Topic: General - Billing Inquiry >> Jan 18, 2024  1:42 PM Rea C wrote: Reason for CRM: Patient received a bill for the office visit $233. The bill also had a bill done for over $300 for a lab when she did a swab herself. Patient would like to know why a lab work for a swab costs $300 for a swab that she administed. She tried to speak with billing department but they were no help and told her to contact the clinic. Patient also spoke with [employer] and they also could not help her. Patient would like a callback to know why she was charged that, just an explanation of the charges.     [phone] (M)

## 2024-02-06 ENCOUNTER — Other Ambulatory Visit: Payer: Self-pay | Admitting: Obstetrics & Gynecology

## 2024-02-06 ENCOUNTER — Ambulatory Visit (HOSPITAL_BASED_OUTPATIENT_CLINIC_OR_DEPARTMENT_OTHER): Payer: Self-pay | Admitting: Obstetrics & Gynecology

## 2024-02-06 DIAGNOSIS — Z1231 Encounter for screening mammogram for malignant neoplasm of breast: Secondary | ICD-10-CM

## 2024-02-13 ENCOUNTER — Encounter (HOSPITAL_BASED_OUTPATIENT_CLINIC_OR_DEPARTMENT_OTHER): Payer: Self-pay | Admitting: Obstetrics & Gynecology

## 2024-02-13 ENCOUNTER — Ambulatory Visit (INDEPENDENT_AMBULATORY_CARE_PROVIDER_SITE_OTHER): Admitting: Obstetrics & Gynecology

## 2024-02-13 ENCOUNTER — Other Ambulatory Visit (HOSPITAL_COMMUNITY)
Admission: RE | Admit: 2024-02-13 | Discharge: 2024-02-13 | Disposition: A | Discharge status: Home / Self Care | Source: Ambulatory Visit | Attending: Obstetrics & Gynecology | Admitting: Obstetrics & Gynecology

## 2024-02-13 VITALS — BP 114/72 | HR 79 | Ht 66.0 in | Wt 155.8 lb

## 2024-02-13 DIAGNOSIS — N926 Irregular menstruation, unspecified: Secondary | ICD-10-CM

## 2024-02-13 DIAGNOSIS — N951 Menopausal and female climacteric states: Secondary | ICD-10-CM

## 2024-02-13 NOTE — Progress Notes (Signed)
" ° °  GYNECOLOGY  VISIT  CC:   Follow-up   HPI: 49 y.o. G3P2012 Married White or Caucasian female here for symptoms she is concerned could be perimenopausal.  She's had several years of midcycle spotting that typically lasts for two days.  Cycles are more irregular as well.  And now, if she strains at all, she will have irregular spotting.  Pap smear 05/2023 ASCUS with neg HR HPV.  She is also having more insomnia and irritability.  She feels she is having increased issues focus and more issues with feeling really cold.  Denies hot flashes or night sweats.  Lastly, she's having some increased pelvic pain.    She is on microgestin  1/20 dosage.  Currently on placebo week.  Last PUS 02/2019.  Last pap 05/2023 ASCUS with neg HR HPV  LMP: 01/15/2024  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   Anxiety    Atypical chest pain 04/24/2017   Dyspepsia, s/p normal EGD 03/08/17, with recommendation for lifetime anti-reflux regimen 03/26/2017   GAD (generalized anxiety disorder) 05/10/2016   GERD (gastroesophageal reflux disease)    History of spinal fusion for scoliosis 03/26/2017   HSV infection    Hyperlipidemia    Polyarthralgia 03/28/2017   Positive Lyme disease serology    Seasonal allergies    Tension headache 03/26/2017   Ulcerative colitis (HCC)    Vertigo     MEDS:  Reviewed in EPIC  ALLERGIES: Patient has no known allergies.  SH:  married, non smoker  Review of Systems  Constitutional: Negative.   Respiratory: Negative.    Cardiovascular: Negative.     PHYSICAL EXAMINATION:    Ht 5' 6 (1.676 m)   Wt 155 lb 12.8 oz (70.7 kg)   BMI 25.15 kg/m     General appearance: alert, cooperative and appears stated age Lymph:  no inguinal LAD noted  Pelvic: External genitalia:  no lesions              Urethra:  normal appearing urethra with no masses, tenderness or lesions              Bartholins and Skenes: normal                 Vagina: normal mucosa  without prolapse or lesions              Cervix: no lesions              Bimanual Exam:  Uterus:  normal size, contour, position, consistency, mobility, non-tender              Adnexa: no mass, fullness, tenderness  Chaperone was present for exam.  Assessment/Plan: 1. Irregular bleeding (Primary) - pt is going to return for ultrasound.  Vaginitis testing obtained today. - US  PELVIS TRANSVAGINAL NON-OB (TV ONLY); Future - Cervicovaginal ancillary only( [place])  2. Perimenopausal symptoms - will check hormonal levels since she is on placebo week - Estradiol  - Anti mullerian hormone    "

## 2024-02-14 LAB — CERVICOVAGINAL ANCILLARY ONLY
Bacterial Vaginitis (gardnerella): NEGATIVE
Candida Glabrata: NEGATIVE
Candida Vaginitis: NEGATIVE
Comment: NEGATIVE
Comment: NEGATIVE
Comment: NEGATIVE

## 2024-02-16 ENCOUNTER — Ambulatory Visit (HOSPITAL_BASED_OUTPATIENT_CLINIC_OR_DEPARTMENT_OTHER): Payer: Self-pay | Admitting: Obstetrics & Gynecology

## 2024-02-17 LAB — ANTI MULLERIAN HORMONE: ANTI-MULLERIAN HORMONE (AMH): 0.049 ng/mL

## 2024-02-17 LAB — ESTRADIOL: Estradiol: 294 pg/mL

## 2024-02-19 NOTE — Progress Notes (Unsigned)
" ° °  GYNECOLOGY  VISIT  CC:   No chief complaint on file.   HPI: 49 y.o. G54P2012 Married White or Caucasian female here for ultrasound follow up.  Patient's last menstrual period was 01/15/2024.  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   Anxiety    Atypical chest pain 04/24/2017   Dyspepsia, s/p normal EGD 03/08/17, with recommendation for lifetime anti-reflux regimen 03/26/2017   GAD (generalized anxiety disorder) 05/10/2016   GERD (gastroesophageal reflux disease)    History of spinal fusion for scoliosis 03/26/2017   HSV infection    Hyperlipidemia    Polyarthralgia 03/28/2017   Positive Lyme disease serology    Seasonal allergies    Tension headache 03/26/2017   Ulcerative colitis (HCC)    Vertigo     MEDS:  Reviewed in EPIC  ALLERGIES: Patient has no known allergies.  SH:  ***  ROS  PHYSICAL EXAMINATION:    LMP 01/15/2024     General appearance: alert, cooperative and appears stated age Neck: no adenopathy, supple, symmetrical, trachea midline and thyroid  {CHL AMB PHY EX THYROID  NORM [phone]::normal to inspection and palpation} CV:  {Exam; heart brief:31539} Lungs:  {pe lungs ob:314451} Breasts: {Exam; breast:13139::normal appearance, no masses or tenderness} Abdomen: soft, non-tender; bowel sounds normal; no masses,  no organomegaly Lymph:  no inguinal LAD noted  Pelvic: External genitalia:  no lesions              Urethra:  normal appearing urethra with no masses, tenderness or lesions              Bartholins and Skenes: normal                 Vagina: {exam; pelvic vaginal:30846}              Cervix: {CHL AMB PHY EX CERVIX NORM DEFAULT:[phone]::no lesions}              Bimanual Exam:  Uterus:  {CHL AMB PHY EX UTERUS NORM [phone]::normal size, contour, position, consistency, mobility, non-tender}              Adnexa: {CHL AMB PHY EX ADNEXA NO MASS DEFAULT:[phone]::no mass, fullness,  tenderness}              Rectovaginal: {yes no:[id number]}.  Confirms.              Anus:  normal sphincter tone, no lesions  Chaperone was present for exam.  Assessment/Plan: There are no diagnoses linked to this encounter.  "

## 2024-02-20 ENCOUNTER — Other Ambulatory Visit (HOSPITAL_BASED_OUTPATIENT_CLINIC_OR_DEPARTMENT_OTHER)

## 2024-02-20 ENCOUNTER — Encounter (HOSPITAL_BASED_OUTPATIENT_CLINIC_OR_DEPARTMENT_OTHER): Payer: Self-pay | Admitting: Obstetrics & Gynecology

## 2024-02-20 ENCOUNTER — Ambulatory Visit (HOSPITAL_BASED_OUTPATIENT_CLINIC_OR_DEPARTMENT_OTHER): Payer: Self-pay | Admitting: Obstetrics & Gynecology

## 2024-02-20 VITALS — BP 108/72 | HR 79

## 2024-02-20 DIAGNOSIS — N926 Irregular menstruation, unspecified: Secondary | ICD-10-CM

## 2024-02-20 DIAGNOSIS — N951 Menopausal and female climacteric states: Secondary | ICD-10-CM

## 2024-02-20 MED ORDER — ESTRADIOL 0.05 MG/24HR TD PTTW: 1.0000 | MEDICATED_PATCH | TRANSDERMAL | 3 refills | Status: AC

## 2024-02-20 MED ORDER — NORETHINDRONE ACETATE 5 MG PO TABS: 2.5000 mg | ORAL_TABLET | Freq: Every day | ORAL | 3 refills | Status: AC

## 2024-03-25 ENCOUNTER — Ambulatory Visit

## 2024-06-03 ENCOUNTER — Ambulatory Visit (HOSPITAL_BASED_OUTPATIENT_CLINIC_OR_DEPARTMENT_OTHER): Admitting: Obstetrics & Gynecology

## 2024-10-28 ENCOUNTER — Encounter: Admitting: Family Medicine
